# Patient Record
Sex: Male | Born: 1940
Health system: Southern US, Community
[De-identification: ages and names within clinical notes are randomized; demographics above are authoritative.]

## PROBLEM LIST (undated history)

## (undated) DIAGNOSIS — T7840XA Allergy, unspecified, initial encounter: Secondary | ICD-10-CM

## (undated) DIAGNOSIS — A071 Giardiasis [lambliasis]: Secondary | ICD-10-CM

## (undated) DIAGNOSIS — J45909 Unspecified asthma, uncomplicated: Secondary | ICD-10-CM

## (undated) HISTORY — DX: Unspecified asthma, uncomplicated: J45.909

## (undated) HISTORY — PX: COLONOSCOPY: SHX174

## (undated) HISTORY — PX: HAND SURGERY: SHX662

## (undated) HISTORY — DX: Giardiasis (lambliasis): A07.1

## (undated) HISTORY — DX: Allergy, unspecified, initial encounter: T78.40XA

## (undated) HISTORY — PX: TONSILLECTOMY: SUR1361

---

## 1997-10-10 ENCOUNTER — Ambulatory Visit (HOSPITAL_COMMUNITY): Admission: RE | Admit: 1997-10-10 | Discharge: 1997-10-10 | Payer: Self-pay | Admitting: Cardiology

## 2000-05-05 ENCOUNTER — Other Ambulatory Visit: Admission: RE | Admit: 2000-05-05 | Discharge: 2000-05-05 | Payer: Self-pay | Admitting: Otolaryngology

## 2003-11-25 ENCOUNTER — Ambulatory Visit (HOSPITAL_COMMUNITY): Admission: RE | Admit: 2003-11-25 | Discharge: 2003-11-25 | Payer: Self-pay | Admitting: Otolaryngology

## 2004-01-31 ENCOUNTER — Ambulatory Visit (HOSPITAL_COMMUNITY): Admission: RE | Admit: 2004-01-31 | Discharge: 2004-01-31 | Payer: Self-pay | Admitting: Otolaryngology

## 2004-01-31 ENCOUNTER — Encounter (INDEPENDENT_AMBULATORY_CARE_PROVIDER_SITE_OTHER): Payer: Self-pay | Admitting: *Deleted

## 2004-01-31 ENCOUNTER — Ambulatory Visit (HOSPITAL_BASED_OUTPATIENT_CLINIC_OR_DEPARTMENT_OTHER): Admission: RE | Admit: 2004-01-31 | Discharge: 2004-01-31 | Payer: Self-pay | Admitting: Otolaryngology

## 2004-04-20 HISTORY — PX: NASAL SINUS SURGERY: SHX719

## 2004-04-29 ENCOUNTER — Ambulatory Visit: Payer: Self-pay | Admitting: Internal Medicine

## 2004-05-06 ENCOUNTER — Ambulatory Visit: Payer: Self-pay | Admitting: Internal Medicine

## 2004-05-13 ENCOUNTER — Ambulatory Visit: Payer: Self-pay | Admitting: Internal Medicine

## 2004-05-20 ENCOUNTER — Ambulatory Visit: Payer: Self-pay | Admitting: Internal Medicine

## 2004-05-27 ENCOUNTER — Ambulatory Visit: Payer: Self-pay | Admitting: Internal Medicine

## 2004-06-03 ENCOUNTER — Ambulatory Visit: Payer: Self-pay | Admitting: Internal Medicine

## 2004-06-11 ENCOUNTER — Ambulatory Visit: Payer: Self-pay | Admitting: Internal Medicine

## 2004-06-18 ENCOUNTER — Ambulatory Visit: Payer: Self-pay | Admitting: Internal Medicine

## 2004-07-01 ENCOUNTER — Ambulatory Visit: Payer: Self-pay | Admitting: Internal Medicine

## 2004-07-08 ENCOUNTER — Ambulatory Visit: Payer: Self-pay | Admitting: Internal Medicine

## 2004-07-15 ENCOUNTER — Ambulatory Visit: Payer: Self-pay | Admitting: Internal Medicine

## 2004-07-22 ENCOUNTER — Ambulatory Visit: Payer: Self-pay | Admitting: Internal Medicine

## 2004-08-05 ENCOUNTER — Ambulatory Visit: Payer: Self-pay | Admitting: Internal Medicine

## 2004-08-12 ENCOUNTER — Ambulatory Visit: Payer: Self-pay | Admitting: Internal Medicine

## 2004-08-19 ENCOUNTER — Ambulatory Visit: Payer: Self-pay | Admitting: Internal Medicine

## 2004-08-26 ENCOUNTER — Ambulatory Visit: Payer: Self-pay | Admitting: Internal Medicine

## 2004-09-02 ENCOUNTER — Ambulatory Visit: Payer: Self-pay | Admitting: Gastroenterology

## 2004-09-02 ENCOUNTER — Ambulatory Visit: Payer: Self-pay | Admitting: Internal Medicine

## 2004-09-02 ENCOUNTER — Encounter (INDEPENDENT_AMBULATORY_CARE_PROVIDER_SITE_OTHER): Payer: Self-pay | Admitting: *Deleted

## 2004-09-10 ENCOUNTER — Ambulatory Visit: Payer: Self-pay | Admitting: Internal Medicine

## 2004-09-16 ENCOUNTER — Ambulatory Visit: Payer: Self-pay | Admitting: Internal Medicine

## 2004-09-30 ENCOUNTER — Ambulatory Visit: Payer: Self-pay | Admitting: Internal Medicine

## 2004-10-07 ENCOUNTER — Ambulatory Visit: Payer: Self-pay | Admitting: Internal Medicine

## 2004-10-16 ENCOUNTER — Ambulatory Visit: Payer: Self-pay | Admitting: Internal Medicine

## 2004-10-30 ENCOUNTER — Ambulatory Visit: Payer: Self-pay | Admitting: Internal Medicine

## 2004-11-04 ENCOUNTER — Ambulatory Visit: Payer: Self-pay | Admitting: Internal Medicine

## 2004-11-18 ENCOUNTER — Ambulatory Visit: Payer: Self-pay | Admitting: Internal Medicine

## 2004-11-25 ENCOUNTER — Ambulatory Visit: Payer: Self-pay | Admitting: Internal Medicine

## 2004-12-09 ENCOUNTER — Ambulatory Visit: Payer: Self-pay | Admitting: Internal Medicine

## 2004-12-23 ENCOUNTER — Ambulatory Visit: Payer: Self-pay | Admitting: Internal Medicine

## 2004-12-30 ENCOUNTER — Ambulatory Visit: Payer: Self-pay | Admitting: Internal Medicine

## 2005-01-06 ENCOUNTER — Ambulatory Visit: Payer: Self-pay | Admitting: Internal Medicine

## 2005-01-13 ENCOUNTER — Ambulatory Visit: Payer: Self-pay | Admitting: Internal Medicine

## 2005-01-20 ENCOUNTER — Ambulatory Visit: Payer: Self-pay | Admitting: Internal Medicine

## 2005-01-26 ENCOUNTER — Ambulatory Visit: Payer: Self-pay | Admitting: Internal Medicine

## 2005-02-03 ENCOUNTER — Ambulatory Visit: Payer: Self-pay | Admitting: Internal Medicine

## 2005-02-04 ENCOUNTER — Ambulatory Visit: Payer: Self-pay | Admitting: Internal Medicine

## 2005-02-17 ENCOUNTER — Ambulatory Visit: Payer: Self-pay | Admitting: Internal Medicine

## 2005-02-24 ENCOUNTER — Ambulatory Visit: Payer: Self-pay | Admitting: Internal Medicine

## 2005-03-03 ENCOUNTER — Ambulatory Visit: Payer: Self-pay | Admitting: Internal Medicine

## 2005-03-10 ENCOUNTER — Ambulatory Visit: Payer: Self-pay | Admitting: Internal Medicine

## 2005-03-17 ENCOUNTER — Ambulatory Visit: Payer: Self-pay | Admitting: Internal Medicine

## 2005-03-24 ENCOUNTER — Ambulatory Visit: Payer: Self-pay | Admitting: Internal Medicine

## 2005-03-26 ENCOUNTER — Ambulatory Visit: Payer: Self-pay | Admitting: Internal Medicine

## 2005-03-31 ENCOUNTER — Ambulatory Visit: Payer: Self-pay | Admitting: Internal Medicine

## 2005-04-06 ENCOUNTER — Ambulatory Visit: Payer: Self-pay | Admitting: Internal Medicine

## 2005-04-07 ENCOUNTER — Ambulatory Visit: Payer: Self-pay | Admitting: Internal Medicine

## 2005-04-28 ENCOUNTER — Ambulatory Visit: Payer: Self-pay | Admitting: Internal Medicine

## 2005-05-05 ENCOUNTER — Ambulatory Visit: Payer: Self-pay | Admitting: Internal Medicine

## 2005-05-13 ENCOUNTER — Ambulatory Visit: Payer: Self-pay | Admitting: Internal Medicine

## 2005-05-21 ENCOUNTER — Ambulatory Visit: Payer: Self-pay | Admitting: Internal Medicine

## 2005-05-26 ENCOUNTER — Ambulatory Visit: Payer: Self-pay | Admitting: Internal Medicine

## 2005-06-08 ENCOUNTER — Ambulatory Visit: Payer: Self-pay | Admitting: Internal Medicine

## 2005-06-16 ENCOUNTER — Ambulatory Visit: Payer: Self-pay | Admitting: Internal Medicine

## 2005-06-23 ENCOUNTER — Ambulatory Visit: Payer: Self-pay | Admitting: Internal Medicine

## 2005-06-29 ENCOUNTER — Ambulatory Visit: Payer: Self-pay | Admitting: Internal Medicine

## 2005-06-30 ENCOUNTER — Ambulatory Visit: Payer: Self-pay | Admitting: Internal Medicine

## 2005-07-07 ENCOUNTER — Ambulatory Visit: Payer: Self-pay | Admitting: Internal Medicine

## 2005-07-14 ENCOUNTER — Ambulatory Visit: Payer: Self-pay | Admitting: Internal Medicine

## 2005-07-28 ENCOUNTER — Ambulatory Visit: Payer: Self-pay | Admitting: Internal Medicine

## 2005-08-04 ENCOUNTER — Ambulatory Visit: Payer: Self-pay | Admitting: Internal Medicine

## 2005-08-13 ENCOUNTER — Ambulatory Visit: Payer: Self-pay | Admitting: Internal Medicine

## 2005-08-17 ENCOUNTER — Ambulatory Visit: Payer: Self-pay | Admitting: Internal Medicine

## 2005-08-25 ENCOUNTER — Ambulatory Visit: Payer: Self-pay | Admitting: Internal Medicine

## 2005-09-01 ENCOUNTER — Ambulatory Visit: Payer: Self-pay | Admitting: Internal Medicine

## 2005-09-08 ENCOUNTER — Ambulatory Visit: Payer: Self-pay | Admitting: Internal Medicine

## 2005-09-16 ENCOUNTER — Ambulatory Visit: Payer: Self-pay | Admitting: Internal Medicine

## 2005-09-29 ENCOUNTER — Ambulatory Visit: Payer: Self-pay | Admitting: Internal Medicine

## 2005-10-06 ENCOUNTER — Ambulatory Visit: Payer: Self-pay | Admitting: Internal Medicine

## 2005-10-13 ENCOUNTER — Ambulatory Visit: Payer: Self-pay | Admitting: Internal Medicine

## 2005-10-20 ENCOUNTER — Ambulatory Visit: Payer: Self-pay | Admitting: Internal Medicine

## 2005-10-27 ENCOUNTER — Ambulatory Visit: Payer: Self-pay | Admitting: Internal Medicine

## 2005-11-02 ENCOUNTER — Ambulatory Visit: Payer: Self-pay | Admitting: Internal Medicine

## 2005-11-17 ENCOUNTER — Ambulatory Visit: Payer: Self-pay | Admitting: Internal Medicine

## 2005-12-08 ENCOUNTER — Ambulatory Visit: Payer: Self-pay | Admitting: Internal Medicine

## 2005-12-29 ENCOUNTER — Ambulatory Visit: Payer: Self-pay | Admitting: Internal Medicine

## 2006-01-05 ENCOUNTER — Ambulatory Visit: Payer: Self-pay | Admitting: Internal Medicine

## 2006-01-12 ENCOUNTER — Ambulatory Visit: Payer: Self-pay | Admitting: Internal Medicine

## 2006-01-19 ENCOUNTER — Ambulatory Visit: Payer: Self-pay | Admitting: Internal Medicine

## 2006-02-02 ENCOUNTER — Ambulatory Visit: Payer: Self-pay | Admitting: Internal Medicine

## 2006-02-09 ENCOUNTER — Ambulatory Visit: Payer: Self-pay | Admitting: Internal Medicine

## 2006-02-16 ENCOUNTER — Ambulatory Visit: Payer: Self-pay | Admitting: Internal Medicine

## 2006-02-22 ENCOUNTER — Ambulatory Visit: Payer: Self-pay | Admitting: Internal Medicine

## 2006-03-02 ENCOUNTER — Ambulatory Visit: Payer: Self-pay | Admitting: Internal Medicine

## 2006-03-08 ENCOUNTER — Ambulatory Visit: Payer: Self-pay | Admitting: Internal Medicine

## 2006-03-15 ENCOUNTER — Ambulatory Visit: Payer: Self-pay | Admitting: Internal Medicine

## 2006-03-30 ENCOUNTER — Ambulatory Visit: Payer: Self-pay | Admitting: Internal Medicine

## 2006-04-07 ENCOUNTER — Ambulatory Visit: Payer: Self-pay | Admitting: Internal Medicine

## 2006-04-12 ENCOUNTER — Ambulatory Visit: Payer: Self-pay | Admitting: Internal Medicine

## 2006-04-27 ENCOUNTER — Ambulatory Visit: Payer: Self-pay | Admitting: Internal Medicine

## 2006-05-04 ENCOUNTER — Ambulatory Visit: Payer: Self-pay | Admitting: Internal Medicine

## 2006-05-11 ENCOUNTER — Ambulatory Visit: Payer: Self-pay | Admitting: Internal Medicine

## 2006-05-18 ENCOUNTER — Ambulatory Visit: Payer: Self-pay | Admitting: Internal Medicine

## 2006-05-25 ENCOUNTER — Ambulatory Visit: Payer: Self-pay | Admitting: Internal Medicine

## 2006-06-01 ENCOUNTER — Ambulatory Visit: Payer: Self-pay | Admitting: Internal Medicine

## 2006-06-08 ENCOUNTER — Ambulatory Visit: Payer: Self-pay | Admitting: Internal Medicine

## 2006-06-15 ENCOUNTER — Ambulatory Visit: Payer: Self-pay | Admitting: Internal Medicine

## 2006-06-22 ENCOUNTER — Ambulatory Visit: Payer: Self-pay | Admitting: Internal Medicine

## 2006-06-29 ENCOUNTER — Ambulatory Visit: Payer: Self-pay | Admitting: Internal Medicine

## 2006-07-06 ENCOUNTER — Ambulatory Visit: Payer: Self-pay | Admitting: Internal Medicine

## 2006-07-20 ENCOUNTER — Ambulatory Visit: Payer: Self-pay | Admitting: Internal Medicine

## 2006-07-27 ENCOUNTER — Ambulatory Visit: Payer: Self-pay | Admitting: Internal Medicine

## 2006-08-03 ENCOUNTER — Ambulatory Visit: Payer: Self-pay | Admitting: Internal Medicine

## 2006-08-10 ENCOUNTER — Ambulatory Visit: Payer: Self-pay | Admitting: Internal Medicine

## 2006-08-17 ENCOUNTER — Ambulatory Visit: Payer: Self-pay | Admitting: Internal Medicine

## 2006-08-23 ENCOUNTER — Ambulatory Visit: Payer: Self-pay | Admitting: Internal Medicine

## 2006-08-24 ENCOUNTER — Ambulatory Visit: Payer: Self-pay | Admitting: Internal Medicine

## 2006-08-31 ENCOUNTER — Ambulatory Visit: Payer: Self-pay | Admitting: Internal Medicine

## 2006-09-07 ENCOUNTER — Ambulatory Visit: Payer: Self-pay | Admitting: Internal Medicine

## 2006-09-14 ENCOUNTER — Ambulatory Visit: Payer: Self-pay | Admitting: Internal Medicine

## 2006-09-28 ENCOUNTER — Ambulatory Visit: Payer: Self-pay | Admitting: Internal Medicine

## 2006-10-05 ENCOUNTER — Ambulatory Visit: Payer: Self-pay | Admitting: Internal Medicine

## 2006-10-11 ENCOUNTER — Ambulatory Visit: Payer: Self-pay | Admitting: Internal Medicine

## 2006-10-21 ENCOUNTER — Ambulatory Visit: Payer: Self-pay | Admitting: Internal Medicine

## 2006-11-02 ENCOUNTER — Ambulatory Visit: Payer: Self-pay | Admitting: Internal Medicine

## 2006-11-08 ENCOUNTER — Ambulatory Visit: Payer: Self-pay | Admitting: Internal Medicine

## 2006-11-23 ENCOUNTER — Ambulatory Visit: Payer: Self-pay | Admitting: Internal Medicine

## 2006-12-07 ENCOUNTER — Ambulatory Visit: Payer: Self-pay | Admitting: Internal Medicine

## 2006-12-21 ENCOUNTER — Ambulatory Visit: Payer: Self-pay | Admitting: Internal Medicine

## 2006-12-28 ENCOUNTER — Ambulatory Visit: Payer: Self-pay | Admitting: Internal Medicine

## 2007-01-04 ENCOUNTER — Ambulatory Visit: Payer: Self-pay | Admitting: Internal Medicine

## 2007-01-18 ENCOUNTER — Ambulatory Visit: Payer: Self-pay | Admitting: Internal Medicine

## 2007-01-25 ENCOUNTER — Ambulatory Visit: Payer: Self-pay | Admitting: Internal Medicine

## 2007-01-26 ENCOUNTER — Ambulatory Visit: Payer: Self-pay | Admitting: Internal Medicine

## 2007-02-18 ENCOUNTER — Ambulatory Visit: Payer: Self-pay | Admitting: Internal Medicine

## 2007-02-22 ENCOUNTER — Ambulatory Visit: Payer: Self-pay | Admitting: Internal Medicine

## 2007-03-01 ENCOUNTER — Ambulatory Visit: Payer: Self-pay | Admitting: Internal Medicine

## 2007-03-08 ENCOUNTER — Ambulatory Visit: Payer: Self-pay | Admitting: Internal Medicine

## 2007-03-15 ENCOUNTER — Ambulatory Visit: Payer: Self-pay | Admitting: Internal Medicine

## 2007-03-22 ENCOUNTER — Ambulatory Visit: Payer: Self-pay | Admitting: Internal Medicine

## 2007-03-29 ENCOUNTER — Ambulatory Visit: Payer: Self-pay | Admitting: Internal Medicine

## 2007-03-29 DIAGNOSIS — J309 Allergic rhinitis, unspecified: Secondary | ICD-10-CM | POA: Insufficient documentation

## 2007-03-29 DIAGNOSIS — J45901 Unspecified asthma with (acute) exacerbation: Secondary | ICD-10-CM | POA: Insufficient documentation

## 2007-04-05 ENCOUNTER — Ambulatory Visit: Payer: Self-pay | Admitting: Internal Medicine

## 2007-04-12 ENCOUNTER — Ambulatory Visit: Payer: Self-pay | Admitting: Internal Medicine

## 2007-04-25 ENCOUNTER — Ambulatory Visit: Payer: Self-pay | Admitting: Internal Medicine

## 2007-04-26 ENCOUNTER — Ambulatory Visit: Payer: Self-pay | Admitting: Internal Medicine

## 2007-05-11 ENCOUNTER — Ambulatory Visit: Payer: Self-pay | Admitting: Internal Medicine

## 2007-05-17 ENCOUNTER — Ambulatory Visit: Payer: Self-pay | Admitting: Internal Medicine

## 2007-05-24 ENCOUNTER — Ambulatory Visit: Payer: Self-pay | Admitting: Internal Medicine

## 2007-06-01 ENCOUNTER — Ambulatory Visit: Payer: Self-pay | Admitting: Internal Medicine

## 2007-06-07 ENCOUNTER — Ambulatory Visit: Payer: Self-pay | Admitting: Internal Medicine

## 2007-06-14 ENCOUNTER — Ambulatory Visit: Payer: Self-pay | Admitting: Internal Medicine

## 2007-06-22 ENCOUNTER — Ambulatory Visit: Payer: Self-pay | Admitting: Internal Medicine

## 2007-07-05 ENCOUNTER — Ambulatory Visit: Payer: Self-pay | Admitting: Internal Medicine

## 2007-07-12 ENCOUNTER — Ambulatory Visit: Payer: Self-pay | Admitting: Internal Medicine

## 2007-07-19 ENCOUNTER — Ambulatory Visit: Payer: Self-pay | Admitting: Internal Medicine

## 2007-07-26 ENCOUNTER — Ambulatory Visit: Payer: Self-pay | Admitting: Internal Medicine

## 2007-08-08 ENCOUNTER — Ambulatory Visit: Payer: Self-pay | Admitting: Internal Medicine

## 2007-08-16 ENCOUNTER — Ambulatory Visit: Payer: Self-pay | Admitting: Internal Medicine

## 2007-08-23 ENCOUNTER — Ambulatory Visit: Payer: Self-pay | Admitting: Internal Medicine

## 2007-08-30 ENCOUNTER — Ambulatory Visit: Payer: Self-pay | Admitting: Internal Medicine

## 2007-09-06 ENCOUNTER — Ambulatory Visit: Payer: Self-pay | Admitting: Internal Medicine

## 2007-09-14 ENCOUNTER — Ambulatory Visit: Payer: Self-pay | Admitting: Internal Medicine

## 2007-09-27 ENCOUNTER — Ambulatory Visit: Payer: Self-pay | Admitting: Internal Medicine

## 2007-10-04 ENCOUNTER — Ambulatory Visit: Payer: Self-pay | Admitting: Internal Medicine

## 2007-10-07 ENCOUNTER — Ambulatory Visit: Payer: Self-pay | Admitting: Internal Medicine

## 2007-10-13 ENCOUNTER — Ambulatory Visit: Payer: Self-pay | Admitting: Internal Medicine

## 2007-10-18 ENCOUNTER — Ambulatory Visit: Payer: Self-pay | Admitting: Internal Medicine

## 2007-11-01 ENCOUNTER — Ambulatory Visit: Payer: Self-pay | Admitting: Internal Medicine

## 2007-11-08 ENCOUNTER — Ambulatory Visit: Payer: Self-pay | Admitting: Internal Medicine

## 2007-12-06 ENCOUNTER — Ambulatory Visit: Payer: Self-pay | Admitting: Internal Medicine

## 2007-12-13 ENCOUNTER — Ambulatory Visit: Payer: Self-pay | Admitting: Internal Medicine

## 2007-12-27 ENCOUNTER — Ambulatory Visit: Payer: Self-pay | Admitting: Internal Medicine

## 2008-01-02 ENCOUNTER — Ambulatory Visit: Payer: Self-pay | Admitting: Internal Medicine

## 2008-01-11 ENCOUNTER — Ambulatory Visit: Payer: Self-pay | Admitting: Internal Medicine

## 2008-01-11 ENCOUNTER — Ambulatory Visit: Payer: Self-pay | Admitting: Pulmonary Disease

## 2008-01-16 ENCOUNTER — Ambulatory Visit: Payer: Self-pay | Admitting: Internal Medicine

## 2008-01-31 ENCOUNTER — Ambulatory Visit: Payer: Self-pay | Admitting: Internal Medicine

## 2008-02-07 ENCOUNTER — Ambulatory Visit: Payer: Self-pay | Admitting: Internal Medicine

## 2008-02-14 ENCOUNTER — Ambulatory Visit: Payer: Self-pay | Admitting: Internal Medicine

## 2008-02-20 ENCOUNTER — Ambulatory Visit: Payer: Self-pay | Admitting: Internal Medicine

## 2008-02-28 ENCOUNTER — Ambulatory Visit: Payer: Self-pay | Admitting: Internal Medicine

## 2008-03-06 ENCOUNTER — Ambulatory Visit: Payer: Self-pay | Admitting: Internal Medicine

## 2008-03-13 ENCOUNTER — Ambulatory Visit: Payer: Self-pay | Admitting: Internal Medicine

## 2008-03-14 ENCOUNTER — Ambulatory Visit: Payer: Self-pay | Admitting: Internal Medicine

## 2008-03-20 ENCOUNTER — Ambulatory Visit: Payer: Self-pay | Admitting: Internal Medicine

## 2008-03-26 ENCOUNTER — Ambulatory Visit: Payer: Self-pay | Admitting: Internal Medicine

## 2008-04-02 ENCOUNTER — Ambulatory Visit: Payer: Self-pay | Admitting: Internal Medicine

## 2008-04-26 ENCOUNTER — Ambulatory Visit: Payer: Self-pay | Admitting: Internal Medicine

## 2008-05-04 ENCOUNTER — Ambulatory Visit: Payer: Self-pay | Admitting: Internal Medicine

## 2008-05-08 ENCOUNTER — Ambulatory Visit: Payer: Self-pay | Admitting: Internal Medicine

## 2008-05-15 ENCOUNTER — Ambulatory Visit (HOSPITAL_COMMUNITY): Admission: RE | Admit: 2008-05-15 | Discharge: 2008-05-15 | Payer: Self-pay | Admitting: Otolaryngology

## 2008-05-15 ENCOUNTER — Ambulatory Visit: Payer: Self-pay | Admitting: Internal Medicine

## 2008-05-22 ENCOUNTER — Ambulatory Visit: Payer: Self-pay | Admitting: Internal Medicine

## 2008-05-29 ENCOUNTER — Ambulatory Visit: Payer: Self-pay | Admitting: Internal Medicine

## 2008-06-05 ENCOUNTER — Ambulatory Visit: Payer: Self-pay | Admitting: Internal Medicine

## 2008-06-13 ENCOUNTER — Ambulatory Visit: Payer: Self-pay | Admitting: Internal Medicine

## 2008-06-26 ENCOUNTER — Ambulatory Visit: Payer: Self-pay | Admitting: Internal Medicine

## 2008-07-03 ENCOUNTER — Ambulatory Visit: Payer: Self-pay | Admitting: Internal Medicine

## 2008-07-10 ENCOUNTER — Ambulatory Visit: Payer: Self-pay | Admitting: Internal Medicine

## 2008-07-24 ENCOUNTER — Ambulatory Visit: Payer: Self-pay | Admitting: Internal Medicine

## 2008-07-31 ENCOUNTER — Ambulatory Visit: Payer: Self-pay | Admitting: Internal Medicine

## 2008-08-08 ENCOUNTER — Ambulatory Visit: Payer: Self-pay | Admitting: Internal Medicine

## 2008-08-13 ENCOUNTER — Ambulatory Visit: Payer: Self-pay | Admitting: Internal Medicine

## 2008-08-14 ENCOUNTER — Ambulatory Visit: Payer: Self-pay | Admitting: Internal Medicine

## 2008-08-21 ENCOUNTER — Ambulatory Visit: Payer: Self-pay | Admitting: Internal Medicine

## 2008-08-28 ENCOUNTER — Ambulatory Visit: Payer: Self-pay | Admitting: Internal Medicine

## 2008-09-11 ENCOUNTER — Ambulatory Visit: Payer: Self-pay | Admitting: Internal Medicine

## 2008-10-01 ENCOUNTER — Ambulatory Visit: Payer: Self-pay | Admitting: Internal Medicine

## 2008-10-05 ENCOUNTER — Ambulatory Visit: Payer: Self-pay | Admitting: Internal Medicine

## 2008-10-12 ENCOUNTER — Ambulatory Visit: Payer: Self-pay | Admitting: Internal Medicine

## 2008-10-15 ENCOUNTER — Ambulatory Visit: Payer: Self-pay | Admitting: Internal Medicine

## 2008-11-01 ENCOUNTER — Ambulatory Visit: Payer: Self-pay | Admitting: Internal Medicine

## 2008-11-06 ENCOUNTER — Ambulatory Visit: Payer: Self-pay | Admitting: Internal Medicine

## 2008-11-20 ENCOUNTER — Ambulatory Visit: Payer: Self-pay | Admitting: Internal Medicine

## 2008-11-27 ENCOUNTER — Ambulatory Visit: Payer: Self-pay | Admitting: Internal Medicine

## 2008-12-12 ENCOUNTER — Ambulatory Visit: Payer: Self-pay | Admitting: Internal Medicine

## 2008-12-18 ENCOUNTER — Ambulatory Visit: Payer: Self-pay | Admitting: Internal Medicine

## 2009-01-02 ENCOUNTER — Ambulatory Visit: Payer: Self-pay | Admitting: Internal Medicine

## 2009-01-08 ENCOUNTER — Ambulatory Visit: Payer: Self-pay | Admitting: Internal Medicine

## 2009-01-15 ENCOUNTER — Ambulatory Visit: Payer: Self-pay | Admitting: Internal Medicine

## 2009-02-06 ENCOUNTER — Ambulatory Visit: Payer: Self-pay | Admitting: Internal Medicine

## 2009-02-12 ENCOUNTER — Ambulatory Visit: Payer: Self-pay | Admitting: Internal Medicine

## 2009-02-19 ENCOUNTER — Ambulatory Visit: Payer: Self-pay | Admitting: Internal Medicine

## 2009-02-27 ENCOUNTER — Ambulatory Visit: Payer: Self-pay | Admitting: Internal Medicine

## 2009-03-04 ENCOUNTER — Ambulatory Visit: Payer: Self-pay | Admitting: Internal Medicine

## 2009-03-05 ENCOUNTER — Encounter: Admission: RE | Admit: 2009-03-05 | Discharge: 2009-03-05 | Payer: Self-pay | Admitting: Cardiology

## 2009-03-12 ENCOUNTER — Ambulatory Visit: Payer: Self-pay | Admitting: Internal Medicine

## 2009-03-19 ENCOUNTER — Ambulatory Visit: Payer: Self-pay | Admitting: Internal Medicine

## 2009-03-19 HISTORY — PX: NM MYOVIEW LTD: HXRAD82

## 2009-03-25 ENCOUNTER — Ambulatory Visit: Payer: Self-pay | Admitting: Internal Medicine

## 2009-04-01 ENCOUNTER — Ambulatory Visit: Payer: Self-pay | Admitting: Internal Medicine

## 2009-04-26 ENCOUNTER — Ambulatory Visit: Payer: Self-pay | Admitting: Internal Medicine

## 2009-04-29 ENCOUNTER — Ambulatory Visit: Payer: Self-pay | Admitting: Internal Medicine

## 2009-05-07 ENCOUNTER — Ambulatory Visit: Payer: Self-pay | Admitting: Internal Medicine

## 2009-05-15 ENCOUNTER — Ambulatory Visit: Payer: Self-pay | Admitting: Internal Medicine

## 2009-05-21 ENCOUNTER — Ambulatory Visit: Payer: Self-pay | Admitting: Internal Medicine

## 2009-05-28 ENCOUNTER — Ambulatory Visit: Payer: Self-pay | Admitting: Internal Medicine

## 2009-05-29 ENCOUNTER — Ambulatory Visit: Payer: Self-pay | Admitting: Internal Medicine

## 2009-06-04 ENCOUNTER — Ambulatory Visit: Payer: Self-pay | Admitting: Internal Medicine

## 2009-06-17 ENCOUNTER — Ambulatory Visit: Payer: Self-pay | Admitting: Internal Medicine

## 2009-06-25 ENCOUNTER — Ambulatory Visit: Payer: Self-pay | Admitting: Internal Medicine

## 2009-07-10 ENCOUNTER — Ambulatory Visit: Payer: Self-pay | Admitting: Internal Medicine

## 2009-07-16 ENCOUNTER — Ambulatory Visit: Payer: Self-pay | Admitting: Internal Medicine

## 2009-07-22 ENCOUNTER — Encounter: Admission: RE | Admit: 2009-07-22 | Discharge: 2009-07-22 | Payer: Self-pay | Admitting: Cardiology

## 2009-07-22 HISTORY — PX: OTHER SURGICAL HISTORY: SHX169

## 2009-07-23 ENCOUNTER — Ambulatory Visit: Payer: Self-pay | Admitting: Internal Medicine

## 2009-07-30 ENCOUNTER — Ambulatory Visit: Payer: Self-pay | Admitting: Internal Medicine

## 2009-08-06 ENCOUNTER — Ambulatory Visit: Payer: Self-pay | Admitting: Internal Medicine

## 2009-08-13 ENCOUNTER — Ambulatory Visit: Payer: Self-pay | Admitting: Internal Medicine

## 2009-08-20 ENCOUNTER — Ambulatory Visit: Payer: Self-pay | Admitting: Internal Medicine

## 2009-08-26 ENCOUNTER — Ambulatory Visit: Payer: Self-pay | Admitting: Internal Medicine

## 2009-09-03 ENCOUNTER — Ambulatory Visit: Payer: Self-pay | Admitting: Internal Medicine

## 2009-09-10 ENCOUNTER — Ambulatory Visit: Payer: Self-pay | Admitting: Internal Medicine

## 2009-09-23 ENCOUNTER — Ambulatory Visit: Payer: Self-pay | Admitting: Internal Medicine

## 2009-10-08 ENCOUNTER — Ambulatory Visit: Payer: Self-pay | Admitting: Internal Medicine

## 2009-10-15 ENCOUNTER — Ambulatory Visit: Payer: Self-pay | Admitting: Internal Medicine

## 2009-10-22 ENCOUNTER — Ambulatory Visit: Payer: Self-pay | Admitting: Internal Medicine

## 2009-10-23 ENCOUNTER — Ambulatory Visit: Payer: Self-pay | Admitting: Internal Medicine

## 2009-10-29 ENCOUNTER — Ambulatory Visit: Payer: Self-pay | Admitting: Internal Medicine

## 2009-11-04 ENCOUNTER — Ambulatory Visit: Payer: Self-pay | Admitting: Internal Medicine

## 2009-11-27 ENCOUNTER — Ambulatory Visit: Payer: Self-pay | Admitting: Internal Medicine

## 2009-11-29 ENCOUNTER — Ambulatory Visit (HOSPITAL_COMMUNITY): Admission: RE | Admit: 2009-11-29 | Discharge: 2009-11-29 | Payer: Self-pay | Admitting: Otolaryngology

## 2009-12-03 ENCOUNTER — Ambulatory Visit: Payer: Self-pay | Admitting: Internal Medicine

## 2009-12-24 ENCOUNTER — Ambulatory Visit: Payer: Self-pay | Admitting: Internal Medicine

## 2009-12-31 ENCOUNTER — Ambulatory Visit: Payer: Self-pay | Admitting: Internal Medicine

## 2010-01-14 ENCOUNTER — Ambulatory Visit: Payer: Self-pay | Admitting: Internal Medicine

## 2010-01-22 ENCOUNTER — Ambulatory Visit: Payer: Self-pay | Admitting: Internal Medicine

## 2010-01-22 DIAGNOSIS — Z23 Encounter for immunization: Secondary | ICD-10-CM

## 2010-02-05 ENCOUNTER — Ambulatory Visit: Payer: Self-pay | Admitting: Internal Medicine

## 2010-02-11 ENCOUNTER — Ambulatory Visit: Payer: Self-pay | Admitting: Internal Medicine

## 2010-02-18 ENCOUNTER — Ambulatory Visit: Payer: Self-pay | Admitting: Internal Medicine

## 2010-02-26 ENCOUNTER — Ambulatory Visit: Payer: Self-pay | Admitting: Internal Medicine

## 2010-03-03 ENCOUNTER — Ambulatory Visit: Payer: Self-pay | Admitting: Internal Medicine

## 2010-03-04 ENCOUNTER — Ambulatory Visit: Payer: Self-pay | Admitting: Internal Medicine

## 2010-03-05 DIAGNOSIS — E785 Hyperlipidemia, unspecified: Secondary | ICD-10-CM | POA: Insufficient documentation

## 2010-03-05 DIAGNOSIS — Z91018 Allergy to other foods: Secondary | ICD-10-CM | POA: Insufficient documentation

## 2010-03-06 ENCOUNTER — Telehealth (INDEPENDENT_AMBULATORY_CARE_PROVIDER_SITE_OTHER): Payer: Self-pay | Admitting: *Deleted

## 2010-03-11 ENCOUNTER — Ambulatory Visit: Payer: Self-pay | Admitting: Internal Medicine

## 2010-03-18 ENCOUNTER — Ambulatory Visit: Payer: Self-pay | Admitting: Internal Medicine

## 2010-03-25 ENCOUNTER — Ambulatory Visit: Payer: Self-pay | Admitting: Internal Medicine

## 2010-04-01 ENCOUNTER — Ambulatory Visit: Payer: Self-pay | Admitting: Internal Medicine

## 2010-04-15 ENCOUNTER — Ambulatory Visit: Payer: Self-pay | Admitting: Internal Medicine

## 2010-05-03 ENCOUNTER — Ambulatory Visit: Payer: Self-pay | Admitting: Internal Medicine

## 2010-05-08 ENCOUNTER — Ambulatory Visit: Payer: Self-pay | Admitting: Internal Medicine

## 2010-05-13 ENCOUNTER — Ambulatory Visit: Payer: Self-pay | Admitting: Internal Medicine

## 2010-05-14 ENCOUNTER — Ambulatory Visit: Payer: Self-pay | Admitting: Internal Medicine

## 2010-05-19 ENCOUNTER — Telehealth (INDEPENDENT_AMBULATORY_CARE_PROVIDER_SITE_OTHER): Payer: Self-pay | Admitting: *Deleted

## 2010-05-20 NOTE — Miscellaneous (Signed)
Summary: Intradermals/[facility]  Intradermals/[facility]   Imported By: Lester [place] 03/11/2010 11:26:24  _____________________________________________________________________  External Attachment:    Type:   Image     Comment:   External Document

## 2010-05-20 NOTE — Assessment & Plan Note (Signed)
Summary: FLU SHOT/[facility]  Nurse Visit   Orders Added: 1)  Admin 1st Vaccine [90471] 2)  Flu Vaccine 72yrs + [40347]  Flu Vaccine Consent Questions     Do you have a history of severe allergic reactions to this vaccine? no    Any prior history of allergic reactions to egg and/or gelatin? no    Do you have a sensitivity to the preservative Thimersol? no    Do you have a past history of Guillan-Barre Syndrome? no    Do you currently have an acute febrile illness? no    Have you ever had a severe reaction to latex? no    Vaccine information given and explained to patient? yes    Are you currently pregnant? no    Lot Number:[id number]   Exp Date:10/17/2009   Site Given  Right Deltoid IM Tammy Scott  February 27, 2009 11:13 AM

## 2010-05-20 NOTE — Progress Notes (Signed)
Summary: Allergy testing results  ---- Converted from flag ---- ---- 03/05/2010 3:46 PM, Harry Budge MD wrote: Please let Dr Harry [name] know. His skin test pattern was a little less intense, but the overall pattern was very similar to his last previous testing and his allergy vaccine from 2002  I don't recommend that we change his allergy vaccine at this time, not enough difference to justify starting over. Marland Kitchen ------------------------------       Additional Follow-up for Phone Call Additional follow up Details #2::    I called Dr. Haroldine [name]' officee-he is in a meeting at this time and Harry [name] gave me his cell number  [phone] to call later today.Harry [name] CMA  March 06, 2010 9:22 AM    Spoke with Dr. Gwynne [name] of [facility]'s recs.Harry [name] CMA  March 06, 2010 10:47 AM

## 2010-05-20 NOTE — Miscellaneous (Signed)
Summary: FLU SHOT   Flu Vaccine Consent Questions     Do you have a history of severe allergic reactions to this vaccine? no    Any prior history of allergic reactions to egg and/or gelatin? no    Do you have a sensitivity to the preservative Thimersol? no    Do you have a past history of Guillan-Barre Syndrome? no    Do you currently have an acute febrile illness? no    Have you ever had a severe reaction to latex? no    Vaccine information given and explained to patient? yes    Are you currently pregnant? no    Lot Number:[id number]   Site Given Right Deltoid IM   Given by Dimas Millin in allergy lab. Reynaldo Minium CMA  January 13, 2008 3:48 PM            Clinical Lists Changes  Orders: Added new Service order of Admin 1st Vaccine ([address]) - Signed Added new Service order of Flu Vaccine 2yrs + [phone]) - Signed Observations: Added new observation of FLU VAX VIS: 11/29/07 version (01/11/2008 15:48) Added new observation of FLU VAXLOT: [id number] (01/11/2008 15:48) Added new observation of FLU VAXMFR: Glaxosmithkline (01/11/2008 15:48) Added new observation of FLU VAX EXP: 10/17/2008 (01/11/2008 15:48) Added new observation of FLU VAX DSE: 0.23ml (01/11/2008 15:48) Added new observation of FLU VAX: Fluvax 3+ (01/11/2008 15:48)

## 2010-05-20 NOTE — Miscellaneous (Signed)
Summary: Injection Record/[facility]  Injection Record/ Allergy   Imported By: Sherian Rein 08/22/2009 13:59:15  _____________________________________________________________________  External Attachment:    Type:   Image     Comment:   External Document

## 2010-05-20 NOTE — Assessment & Plan Note (Signed)
Summary: allergy testing ok per katie/mg   Vital Signs:  Patient profile:   70 year old male Weight:      222.38 pounds O2 Sat:      97 % on Room air Pulse rate:   62 / minute BP sitting:   128 / 68  (left arm)  Vitals Entered By: Reynaldo Minium CMA (03-11-2010 9:07 AM)  O2 Flow:  Room air CC: Allergy Testing   Primary Provider/Referring Provider:  Cassell Clement, MD  CC:  Allergy Testing.  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 Now needing to establish in EMR.  Mild transient asthma as a [employer] adult. Coming now to update allergy skin test profile.  Records reviewed from paper chart.  Significant nasal and throat irritation with tree/ grass/ weed pollens, cats, dust mite. Several fruits ( uncooked peaches, pears, apples, plums, blackberries) caused throat irritation, avoided by eating cooked fruits. Latex seemed to irritate his wrist once on contact so he uses latex-free. Has been on allergy vaccine successfully since 1996. No recurrence of venom sensitivity- restung several times after venom desensitization remotely by Dr Stefan Church. Skin test-    Preventive Screening-Counseling & Management  Alcohol-Tobacco     Smoking Status: never  Current Medications (verified): 1)  Allegra Allergy 180 Mg Tabs (Fexofenadine Hcl) .... Take 1 By Mouth Once Daily As Needed 2)  Lipitor 10 Mg Tabs (Atorvastatin Calcium) .... Take 1 By Mouth Once Daily 3)  Aleve 220 Mg Tabs (Naproxen Sodium) .... Take As Directed As Needed 4)  Allergy Vaccine 1:10 Gh .... 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  The patient denies shortness of breath with activity, shortness of breath at rest, productive cough, non-productive cough, coughing up blood, chest pain, irregular heartbeats, acid heartburn, indigestion, loss of appetite, weight change, abdominal pain, difficulty swallowing, sore throat, tooth/dental problems, headaches, ear ache, anxiety, hand/feet swelling, rash, change in color of mucus, and fever.    Physical Exam  Additional Exam:  General: A/Ox3; pleasant and cooperative, NAD, fit- appearing SKIN: no rash, lesions NODES: no lymphadenopathy HEENT: [place]/AT, EOM- WNL, Conjuctivae- clear, PERRLA, TM-WN, osteoma right canal,, Nose- clear, Throat- clear and wnl,  white mucoid postnasal drip NECK: Supple w/ fair ROM, JVD- none, normal carotid impulses w/o bruits Thyroid- normal to palpation CHEST: Clear to P&A HEART: RRR, no m/g/r heard ABDOMEN: Soft and nl; nml bowel sounds; no organomegaly or masses noted ZOX:WRUE, nl pulses, no edema  NEURO: Grossly intact to observation      Impression & Recommendations:  Problem # 1:  ALLERGIC RHINITIS (ICD-477.9)  Chronic perennial allergic rhinitis. No significant asthma.  Skin tests continue to reflect clearly atopic symptoms. He has been carefull about exposure limitation.  On comparison with latest skin testing in 2002, current tests show modies reduction in intensity of reaction, but the  pattern is little different. I wll recommend that we continue his present allergy vaccine mix. There wouldn't be a clinically important change.  His updated medication list for this problem includes:    Allegra Allergy 180 Mg Tabs (Fexofenadine hcl) .Marland Kitchen... Take 1 by mouth once daily as needed  Orders: Est. Patient Level III ([address]) Allergy Puncture Test (60454) Allergy I.D Test (09811)  Problem # 2:  PERSONAL HISTORY OF ALLERGY TO OTHER FOODS (ICD-V15.05) As expected, cooking neutralizes the allergic response associated with most triggering foods, supporting probability that the response is indeed alllergic. He avoids triggering foods. Est. Patient Level III (91478) Allergy Puncture Test (29562) Allergy I.D Test (13086)  Medications Added to Medication List This Visit: 1)  Allegra Allergy 180 Mg Tabs (Fexofenadine hcl) .... Take 1 by mouth once daily as needed 2)  Lipitor 10 Mg Tabs (Atorvastatin calcium) .... Take 1 by mouth once daily 3)  Aleve 220 Mg Tabs (Naproxen sodium) .... Take as directed as needed 4)  Allergy Vaccine 1:10 Gh  .... Weekly  Patient Instructions: 1)  Please schedule a follow-up appointment in 1 year. 2)  I will review current skin test results and compare them with your current vaccine. I will let you know if I think we should make any changes. :         Results very similar to last testing in 2002. Not different enough to require change in vaccine mix at this time.l.  3)  Clarinex given 4)  cc Dr Patty Sermons   Orders Added: 1)  Est. Patient Level III [57846] 2)  Allergy Puncture Test [95004] 3)  Allergy I.D Test [96295]

## 2010-05-20 NOTE — Miscellaneous (Signed)
Summary: Injection Record / [facility]    Injection Record / [facility]    Imported By: Lennie Odor 12/20/2009 09:58:21  _____________________________________________________________________  External Attachment:    Type:   Image     Comment:   External Document

## 2010-05-20 NOTE — Miscellaneous (Signed)
Summary: Injection record/[facility]  Injection record/[facility]   Imported By: Sherian Rein 09/10/2009 13:30:31  _____________________________________________________________________  External Attachment:    Type:   Image     Comment:   External Document

## 2010-05-20 NOTE — Assessment & Plan Note (Signed)
Summary: FLU SHOT/[facility]  REFER TO 01-11-08 VISIT. Reynaldo Minium CMA  January 23, 2008 9:50 AM                              ]

## 2010-05-21 DIAGNOSIS — J301 Allergic rhinitis due to pollen: Secondary | ICD-10-CM

## 2010-05-22 NOTE — Miscellaneous (Addendum)
Summary: Injection record  Injection record   Imported By: Lester [place] 05/02/2010 11:46:36  _____________________________________________________________________  External Attachment:    Type:   Image     Comment:   External Document  Appended Document: Injection record NEED HIS GI PROCEDURES...  Appended Document: Injection record colonoscopy from Dr. Victorino Dike cut and pasted in EMR

## 2010-05-27 DIAGNOSIS — J301 Allergic rhinitis due to pollen: Secondary | ICD-10-CM

## 2010-05-28 ENCOUNTER — Telehealth (INDEPENDENT_AMBULATORY_CARE_PROVIDER_SITE_OTHER): Payer: Self-pay | Admitting: *Deleted

## 2010-05-28 NOTE — Progress Notes (Addendum)
Summary: schedule colon w/ propofol. will need better prep  Phone Note Call from Patient   Summary of Call: ENCOURAGE STRICT PREP ADHERENCE PER PRIOR POOR PREP....   Signed by Mardella Layman MD [facility] on 05/19/2010 at 3:23 PM  ________________________________________________________________________ Contacted pt per Dr. Norval Gable instructions to schedule colonoscopy in March 2012 w/ Propofol.  March schedule is not available.  Pt will call back in one week to schedule colonoscopy.   Signed by Ezra Sites RN on 05/19/2010 at 3:37 PM     Appended Document: schedule colon w/ propofol. will need better prep Patient called in and stated per Dr Jarold Motto, schedule him for COLON w/ Propofol for 06/27/10. He is out of town 2/29- 06/26/10. His Pre visit is 06/10/10 @ 0800

## 2010-05-28 NOTE — Procedures (Signed)
Summary: Colonoscopy   Colonoscopy  Procedure date:  09/02/2004  Findings:      Location:  [facility].    Colonoscopy  Procedure date:  09/02/2004  Findings:      Location:  [facility].   Patient Name: Harry [name], Harry [name]. MRN:  Procedure Procedures: Colonoscopy CPT: [id number].  Personnel: Endoscopist: Ulyess Mort, MD.  Exam Location: Exam performed in Outpatient Clinic. Outpatient  Patient Consent: Procedure, Alternatives, Risks and Benefits discussed, consent obtained, from patient. Consent was obtained by the RN.  Indications  Average Risk Screening Routine.  History  Current Medications: Patient is not currently taking Coumadin.  Pre-Exam Physical: Entire physical exam was normal.  Exam Exam: Extent of exam reached: Cecum, extent intended: Cecum.  The cecum was identified by appendiceal orifice and IC valve. Colon retroflexion performed. Images were not taken. ASA Classification: II. Tolerance: good.  Monitoring: Pulse and BP monitoring, Oximetry used. Supplemental O2 given.  Colon Prep Prep results: fair, exam compromised.  Sedation Meds: Patient assessed and found to be appropriate for moderate (conscious) sedation. Fentanyl 100 mcg. given IV. Versed 8 mg. given IV.  Findings - DIVERTICULOSIS: Cecum to Rectum. ICD9: Diverticulosis: 562.10. Comments: mild.   Assessment Abnormal examination, see findings above.  Diagnoses: 562.10: Diverticulosis.   Events  Unplanned Interventions: No intervention was required.  Unplanned Events: There were no complications. Plans Patient Education: Patient given standard instructions for: Diverticulosis. Yearly hemoccult testing recommended. Patient instructed to get routine colonoscopy every 7 years. needs much better prep next exam.  Disposition: After procedure patient sent to recovery. After recovery patient sent home.   This report was created from the original endoscopy  report, which was reviewed and signed by the above listed endoscopist.   Appended Document: Colonoscopy ENCOURAGE STRICT PREP ADHERENCE PER PRIOR POOR PREP....  Appended Document: Colonoscopy Contacted pt per Dr. Norval Gable instructions to schedule colonoscopy in March 2012 w/ Propofol.  March schedule is not available.  Pt will call back in one week to schedule colonoscopy.  Appended Document: Colonoscopy per Pt he does not wish to have MAC he will check his calander and call back with a date.

## 2010-06-03 ENCOUNTER — Encounter: Payer: Self-pay | Admitting: Internal Medicine

## 2010-06-03 ENCOUNTER — Ambulatory Visit (INDEPENDENT_AMBULATORY_CARE_PROVIDER_SITE_OTHER): Payer: BC Managed Care – PPO

## 2010-06-03 DIAGNOSIS — J301 Allergic rhinitis due to pollen: Secondary | ICD-10-CM

## 2010-06-05 NOTE — Progress Notes (Addendum)
Summary: flu vaccine/ fax request from dr Haroldine Laws  Phone Note Call from Patient   Caller: teresa calling for pt dr Haroldine Laws Call For: young Summary of Call: pt needs proof of flu vaccine faxed asap to attn: sue hampton at [facility] [phone]. teresa's contact # is [phone] Initial call taken by: Tivis Ringer, CNA,  May 28, 2010 1:40 PM  Follow-up for Phone Call        I have faxed the requested information.Reynaldo Minium CMA  May 28, 2010 2:35 PM  Rosey Bath is aware and will inform Dr. Haroldine Laws.     Appended Document: flu vaccine/ fax request from dr Haroldine Laws pt called to relay message that information on Flu shot was from last year(2010). Tammy Scott in allergy is locating information for this fall/flu season.

## 2010-06-06 ENCOUNTER — Encounter (INDEPENDENT_AMBULATORY_CARE_PROVIDER_SITE_OTHER): Payer: Self-pay

## 2010-06-10 ENCOUNTER — Ambulatory Visit (INDEPENDENT_AMBULATORY_CARE_PROVIDER_SITE_OTHER): Payer: BC Managed Care – PPO

## 2010-06-10 ENCOUNTER — Encounter: Payer: Self-pay | Admitting: Gastroenterology

## 2010-06-10 DIAGNOSIS — J301 Allergic rhinitis due to pollen: Secondary | ICD-10-CM

## 2010-06-11 NOTE — Assessment & Plan Note (Signed)
Summary: FLU SHOT  Nurse Visit   Allergies: No Known Drug Allergies  Immunizations Administered:  Influenza Vaccine # 1:    Vaccine Type: Fluvax 3+    Site: right deltoid    Mfr: GlaxoSmithKline    Dose: 0.5 ml    Route: IM    Given by: Tammy Scott    Exp. Date: 10/18/2010    Lot #: [id number]  Flu Vaccine Consent Questions:    Do you have a history of severe allergic reactions to this vaccine? no    Any prior history of allergic reactions to egg and/or gelatin? no    Do you have a sensitivity to the preservative Thimersol? no    Do you have a past history of Guillan-Barre Syndrome? no    Do you currently have an acute febrile illness? no    Have you ever had a severe reaction to latex? no    Vaccine information given and explained to patient? yes  Orders Added: 1)  Flu Vaccine 49yrs + [90658] 2)  Admin 1st Vaccine [90471]  Appended Document: FLU SHOT Injection given to patient on 01-22-2010. Vivianne Spence

## 2010-06-17 ENCOUNTER — Ambulatory Visit (INDEPENDENT_AMBULATORY_CARE_PROVIDER_SITE_OTHER): Payer: BC Managed Care – PPO

## 2010-06-17 DIAGNOSIS — J301 Allergic rhinitis due to pollen: Secondary | ICD-10-CM

## 2010-06-17 NOTE — Letter (Signed)
Summary: [facility] Instructions  [facility]  [address] [place], [place] [address]   Phone: [phone]  Fax: [phone]       Harry [name]    November 26, 1940    MRN: [id number]        Procedure Day /Date:  Friday 06/27/2010     Arrival Time: 12:30 pm     Procedure Time: 1:30 pm     Location of Procedure:                    _x _  [facility] (4th Floor)                        PREPARATION FOR COLONOSCOPY WITH MOVIPREP   Starting 5 days prior to your procedure Sunday 3/4 do not eat nuts, seeds, popcorn, corn, beans, peas,  salads, or any raw vegetables.  Do not take any fiber supplements (e.g. Metamucil, Citrucel, and Benefiber).  THE DAY BEFORE YOUR PROCEDURE         DATE: Thursday 3/8  1.  Drink clear liquids the entire day-NO SOLID FOOD  2.  Do not drink anything colored red or purple.  Avoid juices with pulp.  No orange juice.  3.  Drink at least 64 oz. (8 glasses) of fluid/clear liquids during the day to prevent dehydration and help the prep work efficiently.  CLEAR LIQUIDS INCLUDE: Water Jello Ice Popsicles Tea (sugar ok, no milk/cream) Powdered fruit flavored drinks Coffee (sugar ok, no milk/cream) Gatorade Juice: apple, white grape, white cranberry  Lemonade Clear bullion, consomm, broth Carbonated beverages (any kind) Strained chicken noodle soup Hard Candy                             4.  In the morning, mix first dose of MoviPrep solution:    Empty 1 Pouch A and 1 Pouch B into the disposable container    Add lukewarm drinking water to the top line of the container. Mix to dissolve    Refrigerate (mixed solution should be used within 24 hrs)  5.  Begin drinking the prep at 5:00 p.m. The MoviPrep container is divided by 4 marks.   Every 15 minutes drink the solution down to the next mark (approximately 8 oz) until the full liter is complete.   6.  Follow completed prep with 16 oz of clear liquid of your choice (Nothing red  or purple).  Continue to drink clear liquids until bedtime.  7.  Before going to bed, mix second dose of MoviPrep solution:    Empty 1 Pouch A and 1 Pouch B into the disposable container    Add lukewarm drinking water to the top line of the container. Mix to dissolve    Refrigerate  THE DAY OF YOUR PROCEDURE      DATE: Friday 3/9  Beginning at 8:30 a.m. (5 hours before procedure):         1. Every 15 minutes, drink the solution down to the next mark (approx 8 oz) until the full liter is complete.  2. Follow completed prep with 16 oz. of clear liquid of your choice.    3. You may drink clear liquids until 11:30 am  (2 HOURS BEFORE PROCEDURE).   MEDICATION INSTRUCTIONS  Unless otherwise instructed, you should take regular prescription medications with a small sip of water   as early as possible the morning of  your procedure.         OTHER INSTRUCTIONS  You will need a responsible adult at least 71 years of age to accompany you and drive you home.   This person must remain in the waiting room during your procedure.  Wear loose fitting clothing that is easily removed.  Leave jewelry and other valuables at home.  However, you may wish to bring a book to read or  an iPod/MP3 player to listen to music as you wait for your procedure to start.  Remove all body piercing jewelry and leave at home.  Total time from sign-in until discharge is approximately 2-3 hours.  You should go home directly after your procedure and rest.  You can resume normal activities the  day after your procedure.  The day of your procedure you should not:   Drive   Make legal decisions   Operate machinery   Drink alcohol   Return to work  You will receive specific instructions about eating, activities and medications before you leave.    The above instructions have been reviewed and explained to me by   Ulis Rias RN  June 10, 2010 8:23 AM     I fully understand and can verbalize  these instructions _____________________________ Date _________

## 2010-06-17 NOTE — Miscellaneous (Signed)
Summary: Lec previsit  Clinical Lists Changes  Medications: Added new medication of MOVIPREP 100 GM  SOLR (PEG-KCL-NACL-NASULF-NA ASC-C) As per prep instructions. - Signed Rx of MOVIPREP 100 GM  SOLR (PEG-KCL-NACL-NASULF-NA ASC-C) As per prep instructions.;  #1 x 0;  Signed;  Entered by: Ulis Rias RN;  Authorized by: Mardella Layman MD [facility];  Method used: Electronically to [facility] [address] [place], [place]  [id number], Ph: [phone] or [phone], Fax: [phone] Observations: Added new observation of NKA: T (06/10/2010 8:03)    Prescriptions: MOVIPREP 100 GM  SOLR (PEG-KCL-NACL-NASULF-NA ASC-C) As per prep instructions.  #1 x 0   Entered by:   Ulis Rias RN   Authorized by:   Mardella Layman MD [facility]   Signed by:   Ulis Rias RN on 06/10/2010   Method used:   Electronically to        [facility] (retail)       [address]       [place], [place]  [id number]       Ph: [phone] or [phone]       Fax: [phone]   RxID:   [id number]

## 2010-06-27 ENCOUNTER — Encounter (AMBULATORY_SURGERY_CENTER): Payer: 59 | Admitting: Gastroenterology

## 2010-06-27 ENCOUNTER — Ambulatory Visit: Payer: BC Managed Care – PPO

## 2010-06-27 ENCOUNTER — Encounter: Payer: Self-pay | Admitting: Gastroenterology

## 2010-06-27 DIAGNOSIS — Z1211 Encounter for screening for malignant neoplasm of colon: Secondary | ICD-10-CM

## 2010-06-30 ENCOUNTER — Encounter: Payer: Self-pay | Admitting: Internal Medicine

## 2010-06-30 ENCOUNTER — Ambulatory Visit (INDEPENDENT_AMBULATORY_CARE_PROVIDER_SITE_OTHER): Payer: 59

## 2010-06-30 DIAGNOSIS — J301 Allergic rhinitis due to pollen: Secondary | ICD-10-CM

## 2010-06-30 DIAGNOSIS — J302 Other seasonal allergic rhinitis: Secondary | ICD-10-CM | POA: Insufficient documentation

## 2010-06-30 DIAGNOSIS — J3089 Other allergic rhinitis: Secondary | ICD-10-CM | POA: Insufficient documentation

## 2010-07-01 NOTE — Procedures (Addendum)
Summary: Colonoscopy  Patient: [name] Note: All result statuses are Final unless otherwise noted.  Tests: (1) Colonoscopy (COL)   COL Colonoscopy           DONE     [facility]     [address]     [place], [place]  [address]          COLONOSCOPY PROCEDURE REPORT          PATIENT:  Harry [name], Harry [name]  MR#:  [id number]     BIRTHDATE:  July 31, 1940, 69 yrs. old  GENDER:  male     ENDOSCOPIST:  Vania Rea. Jarold Motto, MD, [facility]     REF. BY:     PROCEDURE DATE:  06/27/2010     PROCEDURE:  Average-risk screening colonoscopy     G0121     ASA CLASS:  Class I     INDICATIONS:  Routine Risk Screening     MEDICATIONS:   Fentanyl 75 mcg IV, Versed 8 mg IV          DESCRIPTION OF PROCEDURE:   After the risks benefits and     alternatives of the procedure were thoroughly explained, informed     consent was obtained.  Digital rectal exam was performed and     revealed no abnormalities.   The LB 180AL [id number] endoscope was     introduced through the anus and advanced to the cecum, which was     identified by both the appendix and ileocecal valve, without     limitations.  The quality of the prep was adequate, using     MoviPrep.  The instrument was then slowly withdrawn as the colon     was fully examined.     <<PROCEDUREIMAGES>>          FINDINGS:  No polyps or cancers were seen.  This was otherwise a     normal examination of the colon.   Retroflexed views in the rectum     revealed no abnormalities.    The scope was then withdrawn from     the patient and the procedure completed.          COMPLICATIONS:  None     ENDOSCOPIC IMPRESSION:     1) No polyps or cancers     2) Otherwise normal examination     RECOMMENDATIONS:     1) Continue current colorectal screening recommendations for     "routine risk" patients with a repeat colonoscopy in 10 years.     REPEAT EXAM:  No          ______________________________     Vania Rea. Jarold Motto, MD, [facility]          CC:  Cassell Clement, MD          n.     Rosalie DoctorMarland Kitchen   Vania Rea. Patterson at 06/27/2010 02:04 PM          Hermelinda Medicus, [id number]  Note: An exclamation mark (!) indicates a result that was not dispersed into the flowsheet. Document Creation Date: 06/27/2010 2:04 PM _______________________________________________________________________  (1) Order result status: Final Collection or observation date-time: 06/27/2010 13:59 Requested date-time:  Receipt date-time:  Reported date-time:  Referring Physician:   Ordering Physician: Sheryn Bison [phone]) Specimen Source:  Source: Launa Grill Order Number: [id number] Lab site:   Appended Document: Colonoscopy    Clinical Lists Changes  Observations: Added new observation of COLONNXTDUE: 06/2020 (06/27/2010 14:22)

## 2010-07-08 ENCOUNTER — Ambulatory Visit (INDEPENDENT_AMBULATORY_CARE_PROVIDER_SITE_OTHER): Payer: 59

## 2010-07-08 DIAGNOSIS — J301 Allergic rhinitis due to pollen: Secondary | ICD-10-CM

## 2010-07-08 NOTE — Assessment & Plan Note (Signed)
Summary: allergy/cb  Nurse Visit   Allergies: No Known Drug Allergies  Orders Added: 1)  Allergy Injection (1) [95115] 

## 2010-07-11 ENCOUNTER — Telehealth: Payer: Self-pay | Admitting: *Deleted

## 2010-07-11 DIAGNOSIS — R198 Other specified symptoms and signs involving the digestive system and abdomen: Secondary | ICD-10-CM

## 2010-07-11 MED ORDER — METRONIDAZOLE 500 MG PO TABS: 500.0000 mg | ORAL_TABLET | Freq: Two times a day (BID) | ORAL | Status: DC

## 2010-07-11 NOTE — Telephone Encounter (Signed)
Agree with plan 

## 2010-07-11 NOTE — Telephone Encounter (Signed)
Patient phoned and spoke with Dr. Patty Sermons c/o GI problems.  Will rx flagyl 500 gm po bid

## 2010-07-15 ENCOUNTER — Ambulatory Visit (INDEPENDENT_AMBULATORY_CARE_PROVIDER_SITE_OTHER): Payer: 59

## 2010-07-15 DIAGNOSIS — J301 Allergic rhinitis due to pollen: Secondary | ICD-10-CM

## 2010-07-21 ENCOUNTER — Ambulatory Visit (INDEPENDENT_AMBULATORY_CARE_PROVIDER_SITE_OTHER): Payer: 59

## 2010-07-21 DIAGNOSIS — J301 Allergic rhinitis due to pollen: Secondary | ICD-10-CM

## 2010-08-04 ENCOUNTER — Ambulatory Visit (INDEPENDENT_AMBULATORY_CARE_PROVIDER_SITE_OTHER): Payer: 59

## 2010-08-04 DIAGNOSIS — J309 Allergic rhinitis, unspecified: Secondary | ICD-10-CM

## 2010-08-05 ENCOUNTER — Ambulatory Visit (INDEPENDENT_AMBULATORY_CARE_PROVIDER_SITE_OTHER): Payer: 59

## 2010-08-05 DIAGNOSIS — J309 Allergic rhinitis, unspecified: Secondary | ICD-10-CM

## 2010-08-12 ENCOUNTER — Ambulatory Visit (INDEPENDENT_AMBULATORY_CARE_PROVIDER_SITE_OTHER): Payer: 59

## 2010-08-12 DIAGNOSIS — J309 Allergic rhinitis, unspecified: Secondary | ICD-10-CM

## 2010-08-19 ENCOUNTER — Other Ambulatory Visit: Payer: Self-pay | Admitting: *Deleted

## 2010-08-19 ENCOUNTER — Ambulatory Visit (INDEPENDENT_AMBULATORY_CARE_PROVIDER_SITE_OTHER): Payer: 59

## 2010-08-19 DIAGNOSIS — J309 Allergic rhinitis, unspecified: Secondary | ICD-10-CM

## 2010-08-19 DIAGNOSIS — Z792 Long term (current) use of antibiotics: Secondary | ICD-10-CM

## 2010-08-19 MED ORDER — CEFUROXIME AXETIL 250 MG PO TABS: 250.0000 mg | ORAL_TABLET | Freq: Two times a day (BID) | ORAL | Status: DC

## 2010-08-19 NOTE — Telephone Encounter (Signed)
Patient phoned and is going [place] the [place].  Requested rx to be filled

## 2010-08-19 NOTE — Telephone Encounter (Signed)
Agree with plan 

## 2010-09-04 ENCOUNTER — Ambulatory Visit (INDEPENDENT_AMBULATORY_CARE_PROVIDER_SITE_OTHER): Payer: 59

## 2010-09-04 DIAGNOSIS — J309 Allergic rhinitis, unspecified: Secondary | ICD-10-CM

## 2010-09-05 NOTE — Op Note (Signed)
NAMEJAHZIAH, Harry [name]              ACCOUNT NO.:  [id number]   MEDICAL RECORD NO.:  [id number]          PATIENT TYPE:  AMB   LOCATION:  DSC                          FACILITY:  [facility]   PHYSICIAN:  Christopher E. Ezzard Standing, M.D.DATE OF BIRTH:  1940/08/01   DATE OF PROCEDURE:  01/31/2004  DATE OF DISCHARGE:                                 OPERATIVE REPORT   PREOPERATIVE DIAGNOSIS:  Bilateral ethmoid maxillary sinus disease with  polypoid changes, chronic.   POSTOPERATIVE DIAGNOSIS:  Bilateral ethmoid maxillary sinus disease with  polypoid changes, chronic.   OPERATION:  Functional endoscopic sinus surgery with bilateral total  ethmoidectomies.  Bilateral maxillary ostial enlargements with removal of  polypoid disease.   SURGEON:  Kristine Garbe. Ezzard Standing, M.D.   ANESTHESIA:  General anesthesia.   COMPLICATIONS:  None.   BRIEF CLINICAL NOTE:  Harry [name] is a 70 year old gentleman who has had  history of recurrent sinus disease.  He has had a long history of allergies.  This last year he has been on several rounds of antibiotics because of  recurrent sinus disease.  Repeat CT scans show opacification of bilateral  ethmoid regions as well as opacification of bilateral maxillary sinuses,  right side worse than left.  He is taken to the operating room at this time  for functional endoscopic sinus surgery.   DESCRIPTION OF PROCEDURE:  The patient was brought to the operating room  under IV sedation.  Nose was prepped with Betadine solution, was draped out  with sterile towels.  Nose was then further prepped with cotton pledgets  soaked in 4% cocaine solution and turbinate, septum and ethmoid area were  injected with Xylocaine with epinephrine for local anesthetic.  Of note, the  patient had a deviation of the septum to the left with adhesion from the  septum to the lateral nasal wall on the left side which was fairly dense and  felt to be secondary to trauma.  This adhesion was lysed  with scissors.  At  the end of the case, a sheath of Silastic was secured with a 3-0 nylon  suture placed between the lateral nasal wall and the septal adhesion on the  let side.  First the right region was approached.  The middle turbinate was  fractured medially.  The uncinate process was sharply incised with a sickle  knife.  The anterior ethmoid region was opened up with straight through cup  forceps.  The maxillary ostia was identified and there was a large polypoid  mass obstructing the left maxillary ostia.  The mass was grasped with up  biting forceps and removed.  Suction and further removal of polypoid disease  within the right maxillary sinus was performed.  Following this, the  anterior and posterior ethmoid region were opened up with straight through  forceps.  Polypoid disease, very thickened mucosa was removed.  There was no  real purulence.  A cotton pledget soaked in 4% cocaine solution was placed  for hemostasis.  Following this, the left side was approached.  Again the  uncinate  process was sharply incised with a sickle knife, was  removed with  straight through cup forceps.  The anterior ethmoid area was opened up with  through cup forceps.  The maxillary ostia was identified in the left and was  enlarged with back biting and straight through cup forceps to approximately  1 cm size.  The left maxillary sinus had some mucoperiosteal thickening but  no real polypoid mass  could be visualized.  There was no purulence within  the sinus.  Following this, the anterior and posterior ethmoid cells were  opened up with straight through cup forceps.  There was a polypoid disease  throughout the anterior as well as the posterior ethmoid region.  Likewise  polypoid disease up in the nasal frontal region removed with up biting cup  forceps.  Roof of the ethmoid was identified on the left side as was the  lamina.  This completed the procedure.  __________ sinus packs were placed   within  the middle meatus bilaterally and hydrated with Afrin.  A 0.3 size  sheet of Silastic was secured to either side of the septum where the  adhesion was on the left side with a 3-0 nylon suture.  Harry [name] was awakened  from anesthesia and transferred to the recovery room postoperatively doing  well.  Of note, the patient received 1 g Ancef  IV preoperatively as well as  10 mg of Decadron IV preoperatively.   DISPOSITION:  Harry [name] is discharged home later this morning on Vicodin and  Tylenol p.r.n. pain, Keflex 500 mg b.i.d. for a week.  Will have him follow  up in my office in four to five days to have the __________ sinus packs  removed.       CEN/MEDQ  D:  01/31/2004  T:  01/31/2004  Job:  [id number]

## 2010-09-09 ENCOUNTER — Ambulatory Visit (INDEPENDENT_AMBULATORY_CARE_PROVIDER_SITE_OTHER): Payer: 59

## 2010-09-09 DIAGNOSIS — J309 Allergic rhinitis, unspecified: Secondary | ICD-10-CM

## 2010-09-16 ENCOUNTER — Ambulatory Visit (INDEPENDENT_AMBULATORY_CARE_PROVIDER_SITE_OTHER): Payer: 59

## 2010-09-16 DIAGNOSIS — J309 Allergic rhinitis, unspecified: Secondary | ICD-10-CM

## 2010-09-23 ENCOUNTER — Ambulatory Visit (INDEPENDENT_AMBULATORY_CARE_PROVIDER_SITE_OTHER): Payer: 59

## 2010-09-23 DIAGNOSIS — J309 Allergic rhinitis, unspecified: Secondary | ICD-10-CM

## 2010-09-24 ENCOUNTER — Ambulatory Visit (INDEPENDENT_AMBULATORY_CARE_PROVIDER_SITE_OTHER): Payer: 59

## 2010-09-24 DIAGNOSIS — J309 Allergic rhinitis, unspecified: Secondary | ICD-10-CM

## 2010-09-30 ENCOUNTER — Ambulatory Visit (INDEPENDENT_AMBULATORY_CARE_PROVIDER_SITE_OTHER): Payer: 59

## 2010-09-30 DIAGNOSIS — J309 Allergic rhinitis, unspecified: Secondary | ICD-10-CM

## 2010-10-10 ENCOUNTER — Ambulatory Visit (INDEPENDENT_AMBULATORY_CARE_PROVIDER_SITE_OTHER): Payer: 59

## 2010-10-10 DIAGNOSIS — J309 Allergic rhinitis, unspecified: Secondary | ICD-10-CM

## 2010-10-14 ENCOUNTER — Ambulatory Visit (INDEPENDENT_AMBULATORY_CARE_PROVIDER_SITE_OTHER): Payer: 59

## 2010-10-14 DIAGNOSIS — J309 Allergic rhinitis, unspecified: Secondary | ICD-10-CM

## 2010-10-28 ENCOUNTER — Ambulatory Visit (INDEPENDENT_AMBULATORY_CARE_PROVIDER_SITE_OTHER): Payer: 59

## 2010-10-28 DIAGNOSIS — J309 Allergic rhinitis, unspecified: Secondary | ICD-10-CM

## 2010-11-03 ENCOUNTER — Ambulatory Visit (INDEPENDENT_AMBULATORY_CARE_PROVIDER_SITE_OTHER): Payer: 59

## 2010-11-03 DIAGNOSIS — J309 Allergic rhinitis, unspecified: Secondary | ICD-10-CM

## 2010-11-18 ENCOUNTER — Encounter: Payer: Self-pay | Admitting: Internal Medicine

## 2010-11-26 ENCOUNTER — Ambulatory Visit (INDEPENDENT_AMBULATORY_CARE_PROVIDER_SITE_OTHER): Payer: 59

## 2010-11-26 DIAGNOSIS — J309 Allergic rhinitis, unspecified: Secondary | ICD-10-CM

## 2010-12-01 ENCOUNTER — Ambulatory Visit (INDEPENDENT_AMBULATORY_CARE_PROVIDER_SITE_OTHER): Payer: 59

## 2010-12-01 DIAGNOSIS — J309 Allergic rhinitis, unspecified: Secondary | ICD-10-CM

## 2010-12-15 ENCOUNTER — Ambulatory Visit (INDEPENDENT_AMBULATORY_CARE_PROVIDER_SITE_OTHER): Payer: 59

## 2010-12-15 DIAGNOSIS — J309 Allergic rhinitis, unspecified: Secondary | ICD-10-CM

## 2010-12-29 ENCOUNTER — Ambulatory Visit (INDEPENDENT_AMBULATORY_CARE_PROVIDER_SITE_OTHER): Payer: 59

## 2010-12-29 DIAGNOSIS — J309 Allergic rhinitis, unspecified: Secondary | ICD-10-CM

## 2010-12-30 ENCOUNTER — Ambulatory Visit (INDEPENDENT_AMBULATORY_CARE_PROVIDER_SITE_OTHER): Payer: 59

## 2010-12-30 DIAGNOSIS — J309 Allergic rhinitis, unspecified: Secondary | ICD-10-CM

## 2011-01-05 ENCOUNTER — Ambulatory Visit (INDEPENDENT_AMBULATORY_CARE_PROVIDER_SITE_OTHER): Payer: 59

## 2011-01-05 DIAGNOSIS — J309 Allergic rhinitis, unspecified: Secondary | ICD-10-CM

## 2011-01-12 ENCOUNTER — Ambulatory Visit (INDEPENDENT_AMBULATORY_CARE_PROVIDER_SITE_OTHER): Payer: 59

## 2011-01-12 DIAGNOSIS — J309 Allergic rhinitis, unspecified: Secondary | ICD-10-CM

## 2011-01-20 ENCOUNTER — Ambulatory Visit (INDEPENDENT_AMBULATORY_CARE_PROVIDER_SITE_OTHER): Payer: 59

## 2011-01-20 DIAGNOSIS — J309 Allergic rhinitis, unspecified: Secondary | ICD-10-CM

## 2011-01-26 ENCOUNTER — Telehealth: Payer: Self-pay | Admitting: Cardiology

## 2011-01-26 MED ORDER — AZITHROMYCIN 1 G PO PACK: 1.0000 | PACK | Freq: Once | ORAL | Status: AC

## 2011-01-26 NOTE — Telephone Encounter (Signed)
Lawson Fiscal NP reviewed pt record. Recommended zithromycin pack and otc mucinex

## 2011-01-26 NOTE — Telephone Encounter (Signed)
Pt is sick with cold/cough/no fever but wants antibodic called in.  Call into [facility] Out [facility]. Phar.  Please call him back regarding same.  [phone] is another number.

## 2011-01-28 ENCOUNTER — Ambulatory Visit (INDEPENDENT_AMBULATORY_CARE_PROVIDER_SITE_OTHER): Payer: 59

## 2011-01-28 DIAGNOSIS — J309 Allergic rhinitis, unspecified: Secondary | ICD-10-CM

## 2011-02-09 ENCOUNTER — Ambulatory Visit (INDEPENDENT_AMBULATORY_CARE_PROVIDER_SITE_OTHER): Payer: 59

## 2011-02-09 DIAGNOSIS — J309 Allergic rhinitis, unspecified: Secondary | ICD-10-CM

## 2011-02-09 DIAGNOSIS — Z23 Encounter for immunization: Secondary | ICD-10-CM

## 2011-02-20 ENCOUNTER — Ambulatory Visit (INDEPENDENT_AMBULATORY_CARE_PROVIDER_SITE_OTHER): Payer: 59

## 2011-02-20 DIAGNOSIS — J309 Allergic rhinitis, unspecified: Secondary | ICD-10-CM

## 2011-02-24 ENCOUNTER — Ambulatory Visit (INDEPENDENT_AMBULATORY_CARE_PROVIDER_SITE_OTHER): Payer: 59

## 2011-02-24 DIAGNOSIS — J309 Allergic rhinitis, unspecified: Secondary | ICD-10-CM

## 2011-03-02 ENCOUNTER — Ambulatory Visit (INDEPENDENT_AMBULATORY_CARE_PROVIDER_SITE_OTHER): Payer: 59

## 2011-03-02 DIAGNOSIS — J309 Allergic rhinitis, unspecified: Secondary | ICD-10-CM

## 2011-03-10 ENCOUNTER — Ambulatory Visit (INDEPENDENT_AMBULATORY_CARE_PROVIDER_SITE_OTHER): Payer: 59

## 2011-03-10 DIAGNOSIS — J309 Allergic rhinitis, unspecified: Secondary | ICD-10-CM

## 2011-03-16 ENCOUNTER — Encounter: Payer: Self-pay | Admitting: Internal Medicine

## 2011-03-17 ENCOUNTER — Ambulatory Visit (INDEPENDENT_AMBULATORY_CARE_PROVIDER_SITE_OTHER): Payer: 59

## 2011-03-17 DIAGNOSIS — J309 Allergic rhinitis, unspecified: Secondary | ICD-10-CM

## 2011-03-31 ENCOUNTER — Other Ambulatory Visit: Payer: Self-pay | Admitting: *Deleted

## 2011-03-31 DIAGNOSIS — R198 Other specified symptoms and signs involving the digestive system and abdomen: Secondary | ICD-10-CM

## 2011-03-31 DIAGNOSIS — Z792 Long term (current) use of antibiotics: Secondary | ICD-10-CM

## 2011-03-31 MED ORDER — CEFUROXIME AXETIL 250 MG PO TABS: 250.0000 mg | ORAL_TABLET | Freq: Two times a day (BID) | ORAL | Status: DC

## 2011-03-31 MED ORDER — METRONIDAZOLE 500 MG PO TABS: 500.0000 mg | ORAL_TABLET | Freq: Two times a day (BID) | ORAL | Status: DC

## 2011-03-31 NOTE — Telephone Encounter (Signed)
Patient phoned and spoke with  Dr. Patty Sermons.  Will send Rx's as requested

## 2011-04-01 ENCOUNTER — Ambulatory Visit (INDEPENDENT_AMBULATORY_CARE_PROVIDER_SITE_OTHER): Payer: 59

## 2011-04-01 DIAGNOSIS — J309 Allergic rhinitis, unspecified: Secondary | ICD-10-CM

## 2011-04-08 ENCOUNTER — Ambulatory Visit (INDEPENDENT_AMBULATORY_CARE_PROVIDER_SITE_OTHER): Payer: 59

## 2011-04-08 DIAGNOSIS — J309 Allergic rhinitis, unspecified: Secondary | ICD-10-CM

## 2011-04-22 ENCOUNTER — Ambulatory Visit (INDEPENDENT_AMBULATORY_CARE_PROVIDER_SITE_OTHER): Payer: 59

## 2011-04-22 DIAGNOSIS — J309 Allergic rhinitis, unspecified: Secondary | ICD-10-CM

## 2011-04-28 ENCOUNTER — Ambulatory Visit (INDEPENDENT_AMBULATORY_CARE_PROVIDER_SITE_OTHER): Payer: 59

## 2011-04-28 DIAGNOSIS — J309 Allergic rhinitis, unspecified: Secondary | ICD-10-CM

## 2011-04-30 ENCOUNTER — Telehealth: Payer: Self-pay | Admitting: Internal Medicine

## 2011-04-30 NOTE — Telephone Encounter (Signed)
Spoke with Harry [name]-stated she needed documentation of flu shot-We have no record of Flu shot for this season;only have 06-03-10 on file as most recent. She requested I fax that information to her at [phone]. Faxed as requested.

## 2011-05-01 ENCOUNTER — Telehealth: Payer: Self-pay | Admitting: *Deleted

## 2011-05-01 DIAGNOSIS — E785 Hyperlipidemia, unspecified: Secondary | ICD-10-CM

## 2011-05-01 MED ORDER — ATORVASTATIN CALCIUM 20 MG PO TABS: 20.0000 mg | ORAL_TABLET | Freq: Every day | ORAL | Status: DC

## 2011-05-01 NOTE — Telephone Encounter (Signed)
Patient called and requested Rx be phoned in

## 2011-05-01 NOTE — Telephone Encounter (Signed)
I will check with him.  Thanks for calling in the Lipitor.

## 2011-05-05 ENCOUNTER — Ambulatory Visit (INDEPENDENT_AMBULATORY_CARE_PROVIDER_SITE_OTHER): Payer: 59

## 2011-05-05 DIAGNOSIS — J309 Allergic rhinitis, unspecified: Secondary | ICD-10-CM

## 2011-05-12 ENCOUNTER — Ambulatory Visit (INDEPENDENT_AMBULATORY_CARE_PROVIDER_SITE_OTHER): Payer: 59

## 2011-05-12 DIAGNOSIS — J309 Allergic rhinitis, unspecified: Secondary | ICD-10-CM

## 2011-05-19 ENCOUNTER — Ambulatory Visit (INDEPENDENT_AMBULATORY_CARE_PROVIDER_SITE_OTHER): Payer: 59

## 2011-05-19 DIAGNOSIS — J309 Allergic rhinitis, unspecified: Secondary | ICD-10-CM

## 2011-05-26 ENCOUNTER — Ambulatory Visit (INDEPENDENT_AMBULATORY_CARE_PROVIDER_SITE_OTHER): Payer: 59

## 2011-05-26 DIAGNOSIS — J309 Allergic rhinitis, unspecified: Secondary | ICD-10-CM

## 2011-06-02 ENCOUNTER — Ambulatory Visit (INDEPENDENT_AMBULATORY_CARE_PROVIDER_SITE_OTHER): Payer: 59

## 2011-06-02 DIAGNOSIS — J309 Allergic rhinitis, unspecified: Secondary | ICD-10-CM

## 2011-06-09 ENCOUNTER — Ambulatory Visit (INDEPENDENT_AMBULATORY_CARE_PROVIDER_SITE_OTHER): Payer: 59

## 2011-06-09 DIAGNOSIS — J309 Allergic rhinitis, unspecified: Secondary | ICD-10-CM

## 2011-06-10 ENCOUNTER — Telehealth: Payer: Self-pay | Admitting: *Deleted

## 2011-06-10 DIAGNOSIS — M2550 Pain in unspecified joint: Secondary | ICD-10-CM

## 2011-06-10 DIAGNOSIS — R059 Cough, unspecified: Secondary | ICD-10-CM

## 2011-06-10 DIAGNOSIS — R05 Cough: Secondary | ICD-10-CM

## 2011-06-10 DIAGNOSIS — Z792 Long term (current) use of antibiotics: Secondary | ICD-10-CM

## 2011-06-10 MED ORDER — CEFUROXIME AXETIL 250 MG PO TABS: 250.0000 mg | ORAL_TABLET | Freq: Two times a day (BID) | ORAL | Status: DC

## 2011-06-10 MED ORDER — MELOXICAM 15 MG PO TABS: 15.0000 mg | ORAL_TABLET | Freq: Every day | ORAL | Status: DC

## 2011-06-10 NOTE — Telephone Encounter (Signed)
Per phone conversation between  Dr. Patty Sermons will Rx Ceftin 250 mg twice daily for cough and Mobic 15 mg daily for arthritic like pain

## 2011-06-17 ENCOUNTER — Ambulatory Visit (INDEPENDENT_AMBULATORY_CARE_PROVIDER_SITE_OTHER): Payer: 59

## 2011-06-17 DIAGNOSIS — J309 Allergic rhinitis, unspecified: Secondary | ICD-10-CM

## 2011-06-23 ENCOUNTER — Ambulatory Visit (INDEPENDENT_AMBULATORY_CARE_PROVIDER_SITE_OTHER): Payer: 59

## 2011-06-23 DIAGNOSIS — J309 Allergic rhinitis, unspecified: Secondary | ICD-10-CM

## 2011-06-29 ENCOUNTER — Telehealth: Payer: Self-pay | Admitting: Internal Medicine

## 2011-06-29 MED ORDER — PREDNISONE 10 MG PO TABS: ORAL_TABLET | ORAL | Status: DC

## 2011-06-29 NOTE — Telephone Encounter (Signed)
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 Having increased burning and watering of eyes and nose, nasal congestion. Blames early Spring tree pollens. Continues allergy vaccine. Not wheezing. Prednisone to [facility] pharmacy.

## 2011-07-07 ENCOUNTER — Encounter: Payer: Self-pay | Admitting: Internal Medicine

## 2011-07-07 ENCOUNTER — Ambulatory Visit (INDEPENDENT_AMBULATORY_CARE_PROVIDER_SITE_OTHER): Payer: 59

## 2011-07-07 DIAGNOSIS — J309 Allergic rhinitis, unspecified: Secondary | ICD-10-CM

## 2011-07-15 ENCOUNTER — Ambulatory Visit (INDEPENDENT_AMBULATORY_CARE_PROVIDER_SITE_OTHER): Payer: 59

## 2011-07-15 DIAGNOSIS — J309 Allergic rhinitis, unspecified: Secondary | ICD-10-CM

## 2011-07-16 ENCOUNTER — Encounter: Payer: Self-pay | Admitting: *Deleted

## 2011-07-27 ENCOUNTER — Ambulatory Visit (INDEPENDENT_AMBULATORY_CARE_PROVIDER_SITE_OTHER): Payer: 59

## 2011-07-27 DIAGNOSIS — J309 Allergic rhinitis, unspecified: Secondary | ICD-10-CM

## 2011-07-28 ENCOUNTER — Ambulatory Visit (INDEPENDENT_AMBULATORY_CARE_PROVIDER_SITE_OTHER): Payer: 59

## 2011-07-28 DIAGNOSIS — J309 Allergic rhinitis, unspecified: Secondary | ICD-10-CM

## 2011-08-04 ENCOUNTER — Ambulatory Visit (INDEPENDENT_AMBULATORY_CARE_PROVIDER_SITE_OTHER): Payer: 59

## 2011-08-04 DIAGNOSIS — J309 Allergic rhinitis, unspecified: Secondary | ICD-10-CM

## 2011-08-11 ENCOUNTER — Ambulatory Visit (INDEPENDENT_AMBULATORY_CARE_PROVIDER_SITE_OTHER): Payer: 59

## 2011-08-11 DIAGNOSIS — J309 Allergic rhinitis, unspecified: Secondary | ICD-10-CM

## 2011-08-19 ENCOUNTER — Ambulatory Visit (INDEPENDENT_AMBULATORY_CARE_PROVIDER_SITE_OTHER): Payer: 59

## 2011-08-19 DIAGNOSIS — J309 Allergic rhinitis, unspecified: Secondary | ICD-10-CM

## 2011-08-25 ENCOUNTER — Ambulatory Visit (INDEPENDENT_AMBULATORY_CARE_PROVIDER_SITE_OTHER): Payer: 59

## 2011-08-25 DIAGNOSIS — J309 Allergic rhinitis, unspecified: Secondary | ICD-10-CM

## 2011-09-01 ENCOUNTER — Ambulatory Visit (INDEPENDENT_AMBULATORY_CARE_PROVIDER_SITE_OTHER): Payer: 59

## 2011-09-01 DIAGNOSIS — J309 Allergic rhinitis, unspecified: Secondary | ICD-10-CM

## 2011-09-07 ENCOUNTER — Ambulatory Visit (INDEPENDENT_AMBULATORY_CARE_PROVIDER_SITE_OTHER): Payer: 59

## 2011-09-07 ENCOUNTER — Telehealth: Payer: Self-pay | Admitting: *Deleted

## 2011-09-07 DIAGNOSIS — J309 Allergic rhinitis, unspecified: Secondary | ICD-10-CM

## 2011-09-07 DIAGNOSIS — M2550 Pain in unspecified joint: Secondary | ICD-10-CM

## 2011-09-07 DIAGNOSIS — R198 Other specified symptoms and signs involving the digestive system and abdomen: Secondary | ICD-10-CM

## 2011-09-07 MED ORDER — METRONIDAZOLE 500 MG PO TABS: 500.0000 mg | ORAL_TABLET | Freq: Two times a day (BID) | ORAL | Status: AC

## 2011-09-07 MED ORDER — MELOXICAM 15 MG PO TABS: 15.0000 mg | ORAL_TABLET | Freq: Every day | ORAL | Status: AC

## 2011-09-07 NOTE — Telephone Encounter (Signed)
Patient called requesting refills of Mobic and Flagyl, sent to pharmacy as requested

## 2011-09-08 ENCOUNTER — Ambulatory Visit (INDEPENDENT_AMBULATORY_CARE_PROVIDER_SITE_OTHER): Payer: 59

## 2011-09-08 DIAGNOSIS — J309 Allergic rhinitis, unspecified: Secondary | ICD-10-CM

## 2011-09-09 ENCOUNTER — Telehealth: Payer: Self-pay | Admitting: *Deleted

## 2011-09-09 DIAGNOSIS — R05 Cough: Secondary | ICD-10-CM

## 2011-09-09 DIAGNOSIS — R059 Cough, unspecified: Secondary | ICD-10-CM

## 2011-09-09 DIAGNOSIS — J329 Chronic sinusitis, unspecified: Secondary | ICD-10-CM

## 2011-09-09 MED ORDER — CEFUROXIME AXETIL 250 MG PO TABS: 250.0000 mg | ORAL_TABLET | Freq: Two times a day (BID) | ORAL | Status: DC

## 2011-09-09 NOTE — Telephone Encounter (Signed)
Patient called requesting Rx be called in for Ceftin for sinus infection.  Will send to [facility]

## 2011-09-11 ENCOUNTER — Other Ambulatory Visit: Payer: Self-pay | Admitting: Dermatology

## 2011-09-15 ENCOUNTER — Ambulatory Visit (INDEPENDENT_AMBULATORY_CARE_PROVIDER_SITE_OTHER): Payer: 59

## 2011-09-15 DIAGNOSIS — J309 Allergic rhinitis, unspecified: Secondary | ICD-10-CM

## 2011-09-29 ENCOUNTER — Ambulatory Visit (INDEPENDENT_AMBULATORY_CARE_PROVIDER_SITE_OTHER): Payer: 59

## 2011-09-29 DIAGNOSIS — J309 Allergic rhinitis, unspecified: Secondary | ICD-10-CM

## 2011-10-06 ENCOUNTER — Ambulatory Visit (INDEPENDENT_AMBULATORY_CARE_PROVIDER_SITE_OTHER): Payer: 59

## 2011-10-06 DIAGNOSIS — J309 Allergic rhinitis, unspecified: Secondary | ICD-10-CM

## 2011-10-13 ENCOUNTER — Ambulatory Visit (INDEPENDENT_AMBULATORY_CARE_PROVIDER_SITE_OTHER): Payer: 59

## 2011-10-13 DIAGNOSIS — J309 Allergic rhinitis, unspecified: Secondary | ICD-10-CM

## 2011-10-28 ENCOUNTER — Ambulatory Visit (INDEPENDENT_AMBULATORY_CARE_PROVIDER_SITE_OTHER): Payer: 59

## 2011-10-28 DIAGNOSIS — J309 Allergic rhinitis, unspecified: Secondary | ICD-10-CM

## 2011-10-29 ENCOUNTER — Telehealth: Payer: Self-pay | Admitting: *Deleted

## 2011-10-29 DIAGNOSIS — J329 Chronic sinusitis, unspecified: Secondary | ICD-10-CM

## 2011-10-29 MED ORDER — CEFUROXIME AXETIL 250 MG PO TABS: 250.0000 mg | ORAL_TABLET | Freq: Two times a day (BID) | ORAL | Status: AC

## 2011-10-29 NOTE — Telephone Encounter (Signed)
Patient called requesting Ceftin be sent to pharmacy, ok per  Dr. Patty Sermons

## 2011-10-30 ENCOUNTER — Other Ambulatory Visit: Payer: Self-pay | Admitting: *Deleted

## 2011-10-30 MED ORDER — PREDNISONE 5 MG PO TABS: ORAL_TABLET | ORAL | Status: DC

## 2011-10-30 MED ORDER — CLINDAMYCIN HCL 150 MG PO CAPS: 150.0000 mg | ORAL_CAPSULE | Freq: Three times a day (TID) | ORAL | Status: AC

## 2011-10-30 NOTE — Telephone Encounter (Signed)
Filled Prednisone dose pack and Clindamycin as requested by patient.  Ok per  Dr. Patty Sermons

## 2011-11-03 ENCOUNTER — Ambulatory Visit (INDEPENDENT_AMBULATORY_CARE_PROVIDER_SITE_OTHER): Payer: 59

## 2011-11-03 DIAGNOSIS — J309 Allergic rhinitis, unspecified: Secondary | ICD-10-CM

## 2011-11-18 ENCOUNTER — Ambulatory Visit (INDEPENDENT_AMBULATORY_CARE_PROVIDER_SITE_OTHER): Payer: 59

## 2011-11-18 DIAGNOSIS — J309 Allergic rhinitis, unspecified: Secondary | ICD-10-CM

## 2011-11-24 ENCOUNTER — Ambulatory Visit (INDEPENDENT_AMBULATORY_CARE_PROVIDER_SITE_OTHER): Payer: 59

## 2011-11-24 DIAGNOSIS — J309 Allergic rhinitis, unspecified: Secondary | ICD-10-CM

## 2011-12-01 ENCOUNTER — Encounter: Payer: Self-pay | Admitting: Internal Medicine

## 2011-12-14 ENCOUNTER — Ambulatory Visit (INDEPENDENT_AMBULATORY_CARE_PROVIDER_SITE_OTHER): Payer: 59

## 2011-12-14 DIAGNOSIS — J309 Allergic rhinitis, unspecified: Secondary | ICD-10-CM

## 2011-12-23 ENCOUNTER — Ambulatory Visit (INDEPENDENT_AMBULATORY_CARE_PROVIDER_SITE_OTHER): Payer: 59

## 2011-12-23 DIAGNOSIS — J309 Allergic rhinitis, unspecified: Secondary | ICD-10-CM

## 2011-12-29 ENCOUNTER — Ambulatory Visit (INDEPENDENT_AMBULATORY_CARE_PROVIDER_SITE_OTHER): Payer: 59

## 2011-12-29 DIAGNOSIS — J309 Allergic rhinitis, unspecified: Secondary | ICD-10-CM

## 2012-01-11 ENCOUNTER — Telehealth: Payer: Self-pay | Admitting: *Deleted

## 2012-01-11 DIAGNOSIS — Z8249 Family history of ischemic heart disease and other diseases of the circulatory system: Secondary | ICD-10-CM

## 2012-01-11 NOTE — Telephone Encounter (Signed)
Advised patient of date and time

## 2012-01-11 NOTE — Telephone Encounter (Signed)
Received a call from patient requesting follow up ultrasound aorta.  Father passed away secondary and it has been a couple of years since his last one.

## 2012-01-14 ENCOUNTER — Telehealth: Payer: Self-pay | Admitting: Cardiology

## 2012-01-14 NOTE — Telephone Encounter (Signed)
N/A.  LMTC. 

## 2012-01-14 NOTE — Telephone Encounter (Signed)
New problem:  Need Z-  pack called in  To [facility] pharmacy.

## 2012-01-15 ENCOUNTER — Other Ambulatory Visit: Payer: Self-pay | Admitting: Nurse Practitioner

## 2012-01-15 MED ORDER — AZITHROMYCIN 250 MG PO TABS: ORAL_TABLET | ORAL | Status: DC

## 2012-01-15 MED ORDER — AZITHROMYCIN 500 MG PO TABS: ORAL_TABLET | ORAL | Status: DC

## 2012-01-15 NOTE — Telephone Encounter (Signed)
Ok per  Dr. Brackbill  

## 2012-01-15 NOTE — Telephone Encounter (Signed)
Will forward to [name]. 

## 2012-01-15 NOTE — Telephone Encounter (Signed)
Rx sent to [facility] as requested, had sent Zithromax 250 mg but he wanted 500 mg

## 2012-01-18 ENCOUNTER — Ambulatory Visit (INDEPENDENT_AMBULATORY_CARE_PROVIDER_SITE_OTHER): Payer: 59

## 2012-01-18 DIAGNOSIS — Z23 Encounter for immunization: Secondary | ICD-10-CM

## 2012-01-18 DIAGNOSIS — J309 Allergic rhinitis, unspecified: Secondary | ICD-10-CM

## 2012-01-25 ENCOUNTER — Telehealth: Payer: Self-pay | Admitting: Cardiology

## 2012-01-25 NOTE — Telephone Encounter (Signed)
New problem:  Would like [name] to call him.

## 2012-01-25 NOTE — Telephone Encounter (Signed)
Routed to me by mistake 

## 2012-01-25 NOTE — Telephone Encounter (Signed)
Patient called wanting to cancel [facility] duplex for now. Will forward to  Dr. Patty Sermons to make him aware

## 2012-01-26 ENCOUNTER — Ambulatory Visit (INDEPENDENT_AMBULATORY_CARE_PROVIDER_SITE_OTHER): Payer: 59

## 2012-01-26 DIAGNOSIS — J309 Allergic rhinitis, unspecified: Secondary | ICD-10-CM

## 2012-01-27 NOTE — Telephone Encounter (Signed)
Okay 

## 2012-02-08 ENCOUNTER — Telehealth: Payer: Self-pay | Admitting: Internal Medicine

## 2012-02-08 ENCOUNTER — Other Ambulatory Visit: Payer: Self-pay | Admitting: Internal Medicine

## 2012-02-08 ENCOUNTER — Ambulatory Visit (INDEPENDENT_AMBULATORY_CARE_PROVIDER_SITE_OTHER): Payer: 59

## 2012-02-08 ENCOUNTER — Encounter: Payer: Self-pay | Admitting: *Deleted

## 2012-02-08 ENCOUNTER — Ambulatory Visit (INDEPENDENT_AMBULATORY_CARE_PROVIDER_SITE_OTHER)
Admission: RE | Admit: 2012-02-08 | Discharge: 2012-02-08 | Disposition: A | Discharge status: Home / Self Care | Payer: 59 | Source: Ambulatory Visit | Attending: Internal Medicine | Admitting: Internal Medicine

## 2012-02-08 DIAGNOSIS — R05 Cough: Secondary | ICD-10-CM

## 2012-02-08 DIAGNOSIS — R059 Cough, unspecified: Secondary | ICD-10-CM

## 2012-02-08 DIAGNOSIS — J309 Allergic rhinitis, unspecified: Secondary | ICD-10-CM

## 2012-02-08 NOTE — Telephone Encounter (Signed)
Notes Recorded by Waymon Budge, MD on 02/08/2012 at 12:09 PM CXR- overinflation and airway thickening suggest bronchitis      Per CDY- pt already aware of results

## 2012-02-08 NOTE — Telephone Encounter (Signed)
I spoke with pt and is aware this has been faxed over to him. Nothing further was needed

## 2012-02-08 NOTE — Telephone Encounter (Signed)
Here at the office today, concerned about dry cough x 3 weeks for which he has already started himself on antibiotic. He asks for CXR. Father died of lung Ca.

## 2012-02-16 ENCOUNTER — Ambulatory Visit (INDEPENDENT_AMBULATORY_CARE_PROVIDER_SITE_OTHER): Payer: 59

## 2012-02-16 DIAGNOSIS — J309 Allergic rhinitis, unspecified: Secondary | ICD-10-CM

## 2012-02-17 ENCOUNTER — Ambulatory Visit (INDEPENDENT_AMBULATORY_CARE_PROVIDER_SITE_OTHER): Payer: 59

## 2012-02-17 DIAGNOSIS — J309 Allergic rhinitis, unspecified: Secondary | ICD-10-CM

## 2012-02-23 ENCOUNTER — Ambulatory Visit (INDEPENDENT_AMBULATORY_CARE_PROVIDER_SITE_OTHER): Payer: 59

## 2012-02-23 DIAGNOSIS — J309 Allergic rhinitis, unspecified: Secondary | ICD-10-CM

## 2012-03-01 ENCOUNTER — Ambulatory Visit (INDEPENDENT_AMBULATORY_CARE_PROVIDER_SITE_OTHER): Payer: 59

## 2012-03-01 DIAGNOSIS — J309 Allergic rhinitis, unspecified: Secondary | ICD-10-CM

## 2012-03-14 ENCOUNTER — Ambulatory Visit (INDEPENDENT_AMBULATORY_CARE_PROVIDER_SITE_OTHER): Payer: 59

## 2012-03-14 DIAGNOSIS — J309 Allergic rhinitis, unspecified: Secondary | ICD-10-CM

## 2012-03-22 ENCOUNTER — Ambulatory Visit (INDEPENDENT_AMBULATORY_CARE_PROVIDER_SITE_OTHER): Payer: 59

## 2012-03-22 DIAGNOSIS — J309 Allergic rhinitis, unspecified: Secondary | ICD-10-CM

## 2012-03-29 ENCOUNTER — Ambulatory Visit (INDEPENDENT_AMBULATORY_CARE_PROVIDER_SITE_OTHER): Payer: 59

## 2012-03-29 DIAGNOSIS — J309 Allergic rhinitis, unspecified: Secondary | ICD-10-CM

## 2012-04-05 ENCOUNTER — Ambulatory Visit (INDEPENDENT_AMBULATORY_CARE_PROVIDER_SITE_OTHER): Payer: 59

## 2012-04-05 DIAGNOSIS — J309 Allergic rhinitis, unspecified: Secondary | ICD-10-CM

## 2012-04-11 ENCOUNTER — Telehealth: Payer: Self-pay | Admitting: *Deleted

## 2012-04-11 ENCOUNTER — Ambulatory Visit (INDEPENDENT_AMBULATORY_CARE_PROVIDER_SITE_OTHER): Payer: 59

## 2012-04-11 DIAGNOSIS — J309 Allergic rhinitis, unspecified: Secondary | ICD-10-CM

## 2012-04-11 MED ORDER — CEFUROXIME AXETIL 250 MG PO TABS: 250.0000 mg | ORAL_TABLET | Freq: Two times a day (BID) | ORAL | Status: DC

## 2012-04-11 NOTE — Telephone Encounter (Signed)
Refilled per patient request. 

## 2012-04-19 ENCOUNTER — Encounter: Payer: Self-pay | Admitting: Internal Medicine

## 2012-04-28 ENCOUNTER — Ambulatory Visit (INDEPENDENT_AMBULATORY_CARE_PROVIDER_SITE_OTHER): Payer: 59

## 2012-04-28 DIAGNOSIS — J309 Allergic rhinitis, unspecified: Secondary | ICD-10-CM

## 2012-05-03 ENCOUNTER — Ambulatory Visit (INDEPENDENT_AMBULATORY_CARE_PROVIDER_SITE_OTHER): Payer: 59

## 2012-05-03 DIAGNOSIS — J309 Allergic rhinitis, unspecified: Secondary | ICD-10-CM

## 2012-05-10 ENCOUNTER — Ambulatory Visit (INDEPENDENT_AMBULATORY_CARE_PROVIDER_SITE_OTHER): Payer: 59

## 2012-05-10 DIAGNOSIS — J309 Allergic rhinitis, unspecified: Secondary | ICD-10-CM

## 2012-05-16 ENCOUNTER — Telehealth: Payer: Self-pay | Admitting: Cardiology

## 2012-05-16 DIAGNOSIS — E785 Hyperlipidemia, unspecified: Secondary | ICD-10-CM

## 2012-05-16 NOTE — Telephone Encounter (Signed)
Pt wants to come in tomorrow am for fasting labs, wants melinda to call him asap to confirm she can put in order

## 2012-05-16 NOTE — Telephone Encounter (Signed)
Scheduled and advised patient

## 2012-05-17 ENCOUNTER — Ambulatory Visit: Payer: 59

## 2012-05-17 ENCOUNTER — Other Ambulatory Visit (INDEPENDENT_AMBULATORY_CARE_PROVIDER_SITE_OTHER): Payer: 59

## 2012-05-17 ENCOUNTER — Ambulatory Visit (INDEPENDENT_AMBULATORY_CARE_PROVIDER_SITE_OTHER): Payer: 59

## 2012-05-17 DIAGNOSIS — E785 Hyperlipidemia, unspecified: Secondary | ICD-10-CM

## 2012-05-17 DIAGNOSIS — J309 Allergic rhinitis, unspecified: Secondary | ICD-10-CM

## 2012-05-17 LAB — HEPATIC FUNCTION PANEL
ALT: 44 U/L (ref 0–53)
AST: 34 U/L (ref 0–37)
Albumin: 4 g/dL (ref 3.5–5.2)
Alkaline Phosphatase: 62 U/L (ref 39–117)
Bilirubin, Direct: 0 mg/dL (ref 0.0–0.3)
Total Bilirubin: 0.8 mg/dL (ref 0.3–1.2)
Total Protein: 7 g/dL (ref 6.0–8.3)

## 2012-05-17 LAB — BASIC METABOLIC PANEL
BUN: 18 mg/dL (ref 6–23)
CO2: 29 mEq/L (ref 19–32)
Calcium: 8.8 mg/dL (ref 8.4–10.5)
Chloride: 102 mEq/L (ref 96–112)
Creatinine, Ser: 1 mg/dL (ref 0.4–1.5)
GFR: 76.28 mL/min (ref >60.00)
Glucose, Bld: 105 mg/dL — ABNORMAL HIGH (ref 70–99)
Potassium: 4.2 mEq/L (ref 3.5–5.1)
Sodium: 138 mEq/L (ref 135–145)

## 2012-05-17 LAB — LIPID PANEL
Cholesterol: 206 mg/dL — ABNORMAL HIGH (ref 0–200)
HDL: 46.8 mg/dL (ref >39.00)
Total CHOL/HDL Ratio: 4
Triglycerides: 68 mg/dL (ref 0.0–149.0)
VLDL: 13.6 mg/dL (ref 0.0–40.0)

## 2012-05-17 LAB — LDL CHOLESTEROL, DIRECT: Direct LDL: 145.5 mg/dL

## 2012-05-17 NOTE — Progress Notes (Signed)
Quick Note:  Please report to patient. The recent labs are stable. Continue same medication and careful diet. Total cholesterol 206 and LDL 145 too high. TGs okay. BS 105 mildly increased. LFTs normal. With High LDL would take the lipitor more regularly. Fax him a copy if he would like. ______

## 2012-05-19 ENCOUNTER — Telehealth: Payer: Self-pay | Admitting: *Deleted

## 2012-05-19 NOTE — Telephone Encounter (Signed)
Dr. Patty Sermons advised patient of labs and sent to him via computer.

## 2012-05-19 NOTE — Telephone Encounter (Signed)
Message copied by Burnell Blanks on Thu May 19, 2012 12:11 PM ------      Message from: Cassell Clement      Created: Tue May 17, 2012  8:04 PM       Please report to patient.  The recent labs are stable. Continue same medication and careful diet. Total cholesterol 206 and LDL 145 too high. TGs okay. BS 105 mildly increased. LFTs normal. With High LDL would take the lipitor more regularly. Fax him a copy if he would like.

## 2012-05-24 ENCOUNTER — Ambulatory Visit: Payer: 59

## 2012-05-24 ENCOUNTER — Ambulatory Visit (INDEPENDENT_AMBULATORY_CARE_PROVIDER_SITE_OTHER): Payer: 59

## 2012-05-24 DIAGNOSIS — J309 Allergic rhinitis, unspecified: Secondary | ICD-10-CM

## 2012-05-31 ENCOUNTER — Ambulatory Visit: Payer: 59

## 2012-06-07 ENCOUNTER — Ambulatory Visit: Payer: 59

## 2012-06-07 ENCOUNTER — Ambulatory Visit (INDEPENDENT_AMBULATORY_CARE_PROVIDER_SITE_OTHER): Payer: 59

## 2012-06-07 DIAGNOSIS — J309 Allergic rhinitis, unspecified: Secondary | ICD-10-CM

## 2012-06-14 ENCOUNTER — Ambulatory Visit: Payer: 59

## 2012-06-14 ENCOUNTER — Ambulatory Visit (INDEPENDENT_AMBULATORY_CARE_PROVIDER_SITE_OTHER): Payer: 59

## 2012-06-14 DIAGNOSIS — J309 Allergic rhinitis, unspecified: Secondary | ICD-10-CM

## 2012-06-21 ENCOUNTER — Ambulatory Visit (INDEPENDENT_AMBULATORY_CARE_PROVIDER_SITE_OTHER): Payer: 59

## 2012-06-21 ENCOUNTER — Ambulatory Visit: Payer: 59

## 2012-06-21 DIAGNOSIS — J309 Allergic rhinitis, unspecified: Secondary | ICD-10-CM

## 2012-06-28 ENCOUNTER — Ambulatory Visit (INDEPENDENT_AMBULATORY_CARE_PROVIDER_SITE_OTHER): Payer: 59

## 2012-06-28 DIAGNOSIS — J309 Allergic rhinitis, unspecified: Secondary | ICD-10-CM

## 2012-06-30 ENCOUNTER — Ambulatory Visit (INDEPENDENT_AMBULATORY_CARE_PROVIDER_SITE_OTHER): Payer: 59

## 2012-06-30 DIAGNOSIS — J309 Allergic rhinitis, unspecified: Secondary | ICD-10-CM

## 2012-07-05 ENCOUNTER — Ambulatory Visit (INDEPENDENT_AMBULATORY_CARE_PROVIDER_SITE_OTHER): Payer: 59

## 2012-07-05 DIAGNOSIS — J309 Allergic rhinitis, unspecified: Secondary | ICD-10-CM

## 2012-07-12 ENCOUNTER — Ambulatory Visit (INDEPENDENT_AMBULATORY_CARE_PROVIDER_SITE_OTHER): Payer: 59

## 2012-07-12 DIAGNOSIS — J309 Allergic rhinitis, unspecified: Secondary | ICD-10-CM

## 2012-07-19 ENCOUNTER — Ambulatory Visit (INDEPENDENT_AMBULATORY_CARE_PROVIDER_SITE_OTHER): Payer: 59

## 2012-07-19 DIAGNOSIS — J309 Allergic rhinitis, unspecified: Secondary | ICD-10-CM

## 2012-07-25 ENCOUNTER — Encounter: Payer: Self-pay | Admitting: Internal Medicine

## 2012-07-26 ENCOUNTER — Ambulatory Visit (INDEPENDENT_AMBULATORY_CARE_PROVIDER_SITE_OTHER): Payer: 59

## 2012-07-26 DIAGNOSIS — J309 Allergic rhinitis, unspecified: Secondary | ICD-10-CM

## 2012-08-02 ENCOUNTER — Ambulatory Visit: Payer: 59

## 2012-08-04 ENCOUNTER — Ambulatory Visit (INDEPENDENT_AMBULATORY_CARE_PROVIDER_SITE_OTHER): Payer: 59

## 2012-08-04 DIAGNOSIS — J309 Allergic rhinitis, unspecified: Secondary | ICD-10-CM

## 2012-08-09 ENCOUNTER — Ambulatory Visit (INDEPENDENT_AMBULATORY_CARE_PROVIDER_SITE_OTHER): Payer: 59

## 2012-08-09 DIAGNOSIS — J309 Allergic rhinitis, unspecified: Secondary | ICD-10-CM

## 2012-08-16 ENCOUNTER — Ambulatory Visit (INDEPENDENT_AMBULATORY_CARE_PROVIDER_SITE_OTHER): Payer: 59

## 2012-08-16 DIAGNOSIS — J309 Allergic rhinitis, unspecified: Secondary | ICD-10-CM

## 2012-08-23 ENCOUNTER — Ambulatory Visit (INDEPENDENT_AMBULATORY_CARE_PROVIDER_SITE_OTHER): Payer: 59

## 2012-08-23 DIAGNOSIS — J309 Allergic rhinitis, unspecified: Secondary | ICD-10-CM

## 2012-08-30 ENCOUNTER — Ambulatory Visit (INDEPENDENT_AMBULATORY_CARE_PROVIDER_SITE_OTHER): Payer: 59

## 2012-08-30 DIAGNOSIS — J309 Allergic rhinitis, unspecified: Secondary | ICD-10-CM

## 2012-08-31 ENCOUNTER — Other Ambulatory Visit: Payer: 59

## 2012-08-31 ENCOUNTER — Other Ambulatory Visit: Payer: Self-pay | Admitting: *Deleted

## 2012-08-31 DIAGNOSIS — R300 Dysuria: Secondary | ICD-10-CM

## 2012-09-05 ENCOUNTER — Other Ambulatory Visit: Payer: Self-pay | Admitting: Cardiovascular Disease

## 2012-09-05 MED ORDER — CHOLECALCIFEROL 1.25 MG (50000 UT) PO TABS: 1.0000 | ORAL_TABLET | ORAL | Status: DC

## 2012-09-05 MED ORDER — AZITHROMYCIN 500 MG PO TABS: ORAL_TABLET | ORAL | Status: DC

## 2012-09-05 NOTE — Progress Notes (Signed)
Pt called for refills.    Refills completed.

## 2012-09-06 ENCOUNTER — Ambulatory Visit (INDEPENDENT_AMBULATORY_CARE_PROVIDER_SITE_OTHER): Payer: 59

## 2012-09-06 DIAGNOSIS — J309 Allergic rhinitis, unspecified: Secondary | ICD-10-CM

## 2012-09-13 ENCOUNTER — Ambulatory Visit (INDEPENDENT_AMBULATORY_CARE_PROVIDER_SITE_OTHER): Payer: 59

## 2012-09-13 DIAGNOSIS — J309 Allergic rhinitis, unspecified: Secondary | ICD-10-CM

## 2012-09-20 ENCOUNTER — Ambulatory Visit: Payer: 59

## 2012-09-27 ENCOUNTER — Ambulatory Visit (INDEPENDENT_AMBULATORY_CARE_PROVIDER_SITE_OTHER): Payer: 59

## 2012-09-27 DIAGNOSIS — J309 Allergic rhinitis, unspecified: Secondary | ICD-10-CM

## 2012-10-04 ENCOUNTER — Ambulatory Visit (INDEPENDENT_AMBULATORY_CARE_PROVIDER_SITE_OTHER): Payer: 59

## 2012-10-04 DIAGNOSIS — J309 Allergic rhinitis, unspecified: Secondary | ICD-10-CM

## 2012-10-11 ENCOUNTER — Ambulatory Visit (INDEPENDENT_AMBULATORY_CARE_PROVIDER_SITE_OTHER): Payer: 59

## 2012-10-11 DIAGNOSIS — J309 Allergic rhinitis, unspecified: Secondary | ICD-10-CM

## 2012-10-18 ENCOUNTER — Ambulatory Visit: Payer: 59

## 2012-10-26 ENCOUNTER — Ambulatory Visit (INDEPENDENT_AMBULATORY_CARE_PROVIDER_SITE_OTHER): Payer: 59

## 2012-10-26 ENCOUNTER — Telehealth: Payer: Self-pay | Admitting: Internal Medicine

## 2012-10-26 DIAGNOSIS — J309 Allergic rhinitis, unspecified: Secondary | ICD-10-CM

## 2012-10-26 MED ORDER — CEFUROXIME AXETIL 250 MG PO TABS: 250.0000 mg | ORAL_TABLET | Freq: Two times a day (BID) | ORAL | Status: DC

## 2012-10-26 NOTE — Telephone Encounter (Signed)
Acute sinusitis- asks Ceftin 250 mg twice daily, # 30, sent to [facility]

## 2012-10-27 ENCOUNTER — Ambulatory Visit (INDEPENDENT_AMBULATORY_CARE_PROVIDER_SITE_OTHER): Payer: 59

## 2012-10-27 DIAGNOSIS — J309 Allergic rhinitis, unspecified: Secondary | ICD-10-CM

## 2012-10-31 ENCOUNTER — Other Ambulatory Visit: Payer: Self-pay | Admitting: Internal Medicine

## 2012-10-31 MED ORDER — MOMETASONE FUROATE 50 MCG/ACT NA SUSP: 2.0000 | Freq: Every day | NASAL | Status: DC

## 2012-10-31 MED ORDER — MONTELUKAST SODIUM 10 MG PO TABS: 10.0000 mg | ORAL_TABLET | Freq: Every day | ORAL | Status: DC

## 2012-10-31 NOTE — Telephone Encounter (Signed)
Per CY - send in singulair and Nasonex Rx to [facility] outpatient pharm

## 2012-11-01 ENCOUNTER — Telehealth: Payer: Self-pay | Admitting: Internal Medicine

## 2012-11-01 ENCOUNTER — Other Ambulatory Visit (INDEPENDENT_AMBULATORY_CARE_PROVIDER_SITE_OTHER): Payer: 59

## 2012-11-01 ENCOUNTER — Ambulatory Visit (INDEPENDENT_AMBULATORY_CARE_PROVIDER_SITE_OTHER): Payer: 59

## 2012-11-01 DIAGNOSIS — J309 Allergic rhinitis, unspecified: Secondary | ICD-10-CM

## 2012-11-01 DIAGNOSIS — J209 Acute bronchitis, unspecified: Secondary | ICD-10-CM

## 2012-11-01 LAB — CBC WITH DIFFERENTIAL/PLATELET
Basophils Absolute: 0 10*3/uL (ref 0.0–0.1)
Basophils Relative: 0.3 % (ref 0.0–3.0)
Eosinophils Absolute: 0.7 10*3/uL (ref 0.0–0.7)
Eosinophils Relative: 11.5 % — ABNORMAL HIGH (ref 0.0–5.0)
HCT: 46 % (ref 39.0–52.0)
Hemoglobin: 15.7 g/dL (ref 13.0–17.0)
Lymphocytes Relative: 22.6 % (ref 12.0–46.0)
Lymphs Abs: 1.5 10*3/uL (ref 0.7–4.0)
MCHC: 34.2 g/dL (ref 30.0–36.0)
MCV: 95 fl (ref 78.0–100.0)
Monocytes Absolute: 0.7 10*3/uL (ref 0.1–1.0)
Monocytes Relative: 10.5 % (ref 3.0–12.0)
Neutro Abs: 3.6 10*3/uL (ref 1.4–7.7)
Neutrophils Relative %: 55.1 % (ref 43.0–77.0)
Platelets: 190 10*3/uL (ref 150.0–400.0)
RBC: 4.85 Mil/uL (ref 4.22–5.81)
RDW: 12.1 % (ref 11.5–14.6)
WBC: 6.5 10*3/uL (ref 4.5–10.5)

## 2012-11-01 NOTE — Telephone Encounter (Signed)
Noted increased sneeze and nasal congestion on bike rides through woods while on beach vacation. Started Singulair.  Plan- lab for allergy profile, CBC

## 2012-11-02 LAB — ALLERGY FULL PROFILE
Allergen, D pternoyssinus,d7: 2.39 kU/L — ABNORMAL HIGH
Allergen,Goose feathers, e70: <0.1 kU/L
Alternaria Alternata: <0.1 kU/L
Aspergillus fumigatus, m3: <0.1 kU/L
Bahia Grass: 0.16 kU/L — ABNORMAL HIGH
Bermuda Grass: 0.15 kU/L — ABNORMAL HIGH
Box Elder IgE: 0.76 kU/L — ABNORMAL HIGH
Candida Albicans: <0.1 kU/L
Cat Dander: <0.1 kU/L
Common Ragweed: 0.61 kU/L — ABNORMAL HIGH
Curvularia lunata: <0.1 kU/L
D. farinae: 2.41 kU/L — ABNORMAL HIGH
Dog Dander: 0.14 kU/L — ABNORMAL HIGH
Elm IgE: 1.39 kU/L — ABNORMAL HIGH
Fescue: 0.16 kU/L — ABNORMAL HIGH
G005 Rye, Perennial: 0.15 kU/L — ABNORMAL HIGH
G009 Red Top: 0.14 kU/L — ABNORMAL HIGH
Goldenrod: 0.23 kU/L — ABNORMAL HIGH
Helminthosporium halodes: <0.1 kU/L
House Dust Hollister: 0.15 kU/L — ABNORMAL HIGH
IgE (Immunoglobulin E), Serum: 39.1 IU/mL (ref 0.0–180.0)
Lamb's Quarters: 0.21 kU/L — ABNORMAL HIGH
Oak: 2.06 kU/L — ABNORMAL HIGH
Plantain: 0.29 kU/L — ABNORMAL HIGH
Stemphylium Botryosum: <0.1 kU/L
Sycamore Tree: 0.23 kU/L — ABNORMAL HIGH
Timothy Grass: 0.14 kU/L — ABNORMAL HIGH

## 2012-11-08 ENCOUNTER — Ambulatory Visit: Payer: 59

## 2012-11-15 ENCOUNTER — Ambulatory Visit (INDEPENDENT_AMBULATORY_CARE_PROVIDER_SITE_OTHER): Payer: 59

## 2012-11-15 ENCOUNTER — Other Ambulatory Visit: Payer: Self-pay | Admitting: Internal Medicine

## 2012-11-15 DIAGNOSIS — J309 Allergic rhinitis, unspecified: Secondary | ICD-10-CM

## 2012-11-15 MED ORDER — MONTELUKAST SODIUM 10 MG PO TABS: 10.0000 mg | ORAL_TABLET | Freq: Every day | ORAL | Status: DC

## 2012-11-22 ENCOUNTER — Ambulatory Visit (INDEPENDENT_AMBULATORY_CARE_PROVIDER_SITE_OTHER): Payer: 59

## 2012-11-22 DIAGNOSIS — J309 Allergic rhinitis, unspecified: Secondary | ICD-10-CM

## 2012-11-29 ENCOUNTER — Ambulatory Visit: Payer: 59

## 2012-12-06 ENCOUNTER — Ambulatory Visit (INDEPENDENT_AMBULATORY_CARE_PROVIDER_SITE_OTHER): Payer: 59

## 2012-12-06 DIAGNOSIS — I2789 Other specified pulmonary heart diseases: Secondary | ICD-10-CM

## 2012-12-12 ENCOUNTER — Ambulatory Visit (INDEPENDENT_AMBULATORY_CARE_PROVIDER_SITE_OTHER): Payer: 59

## 2012-12-12 DIAGNOSIS — J309 Allergic rhinitis, unspecified: Secondary | ICD-10-CM

## 2012-12-20 ENCOUNTER — Ambulatory Visit: Payer: 59

## 2012-12-21 ENCOUNTER — Ambulatory Visit (INDEPENDENT_AMBULATORY_CARE_PROVIDER_SITE_OTHER): Payer: 59

## 2012-12-21 DIAGNOSIS — J309 Allergic rhinitis, unspecified: Secondary | ICD-10-CM

## 2012-12-27 ENCOUNTER — Ambulatory Visit (INDEPENDENT_AMBULATORY_CARE_PROVIDER_SITE_OTHER): Payer: 59

## 2012-12-27 DIAGNOSIS — J309 Allergic rhinitis, unspecified: Secondary | ICD-10-CM

## 2013-01-03 ENCOUNTER — Ambulatory Visit (INDEPENDENT_AMBULATORY_CARE_PROVIDER_SITE_OTHER): Payer: 59

## 2013-01-03 DIAGNOSIS — J309 Allergic rhinitis, unspecified: Secondary | ICD-10-CM

## 2013-01-10 ENCOUNTER — Ambulatory Visit (INDEPENDENT_AMBULATORY_CARE_PROVIDER_SITE_OTHER): Payer: 59

## 2013-01-10 DIAGNOSIS — J309 Allergic rhinitis, unspecified: Secondary | ICD-10-CM

## 2013-01-17 ENCOUNTER — Ambulatory Visit (INDEPENDENT_AMBULATORY_CARE_PROVIDER_SITE_OTHER): Payer: 59

## 2013-01-17 DIAGNOSIS — J309 Allergic rhinitis, unspecified: Secondary | ICD-10-CM

## 2013-01-18 ENCOUNTER — Ambulatory Visit (INDEPENDENT_AMBULATORY_CARE_PROVIDER_SITE_OTHER): Payer: 59 | Admitting: Internal Medicine

## 2013-01-18 ENCOUNTER — Encounter: Payer: Self-pay | Admitting: Internal Medicine

## 2013-01-18 ENCOUNTER — Encounter: Payer: Self-pay | Admitting: *Deleted

## 2013-01-18 VITALS — BP 118/72 | HR 66 | Ht 75.0 in | Wt 222.0 lb

## 2013-01-18 DIAGNOSIS — J302 Other seasonal allergic rhinitis: Secondary | ICD-10-CM

## 2013-01-18 DIAGNOSIS — Z23 Encounter for immunization: Secondary | ICD-10-CM

## 2013-01-18 DIAGNOSIS — J309 Allergic rhinitis, unspecified: Secondary | ICD-10-CM

## 2013-01-18 MED ORDER — MONTELUKAST SODIUM 10 MG PO TABS: 10.0000 mg | ORAL_TABLET | Freq: Every day | ORAL | Status: DC

## 2013-01-18 MED ORDER — PREDNISONE 5 MG PO TABS: ORAL_TABLET | ORAL | Status: DC

## 2013-01-18 NOTE — Patient Instructions (Addendum)
Script sent for Singulair refill - [facility]  Flu vax  We will remix and restart your vaccine based on current testing  Script for dospak to [facility]

## 2013-01-18 NOTE — Assessment & Plan Note (Signed)
Discussed options. Will restart and rebuild vaccine as discussed. Given refill singulair, prednisone taper to use if needed. Flu vax.

## 2013-01-18 NOTE — Progress Notes (Signed)
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 Nasal stuffiness sniffing. Denies wheeze or cough. Using nasal steroid sprays but tends to get some mild epistaxis. Increased rhinitis and chest congestion this summer. Using Singulair, Nasonex, recent sample Dymista Hx sinus surgery / Dr Ezzard Standing- helped. Coming now to update - no antihistamines, OTC cough syrups, or OTC sleep aids in past 3 days Allergy skin test- 01/18/13- Note changes from previous testing with significant positives dog ( has dog), grasses in addition to other common inhalant allergens  Prior to Admission medications   Medication Sig Start Date End Date Taking? Authorizing Provider  atorvastatin (LIPITOR) 20 MG tablet Take 20 mg by mouth daily. Usually take 10 mg couple times a week 05/01/11 01/18/13 Yes Cassell Clement, MD  Cholecalciferol 50000 UNITS TABS Take 1 tablet by mouth once a week. 09/05/12  Yes Vesta Mixer, MD  mometasone (NASONEX) 50 MCG/ACT nasal spray Place 2 sprays into the nose daily. 10/31/12  Yes Waymon Budge, MD  montelukast (SINGULAIR) 10 MG tablet Take 1 tablet (10 mg total) by mouth at bedtime. 01/18/13  Yes Waymon Budge, MD  predniSONE (DELTASONE) 5 MG tablet 4 X 2 DAYS, 3 X 2 DAYS, 2 X 2 DAYS, 1 X 2 DAYS 01/18/13 01/18/14  Waymon Budge, MD   Past Medical History  Diagnosis Date  . Allergic rhinitis   . Hyperlipidemia    Past Surgical History  Procedure Laterality Date  . Nasal sinus surgery    . Hand surgery  age 44    for infection  . Abdominal aorta ultrasound  07/22/2009    mild atherosclerotic changes without aneurysm  . Nm myoview ltd  03/19/2009    normal/ EF- 68%   Family History  Problem Relation Age of Onset  . Lung cancer Mother   . Aortic aneurysm Father   . Allergic rhinitis      GM   History   Social History  . Marital Status:  Married    Spouse Name: N/A    Number of Children: N/A  . Years of Education: N/A   Occupational History  . ENT surgeon    Social History Main Topics  . Smoking status: Former Smoker    Types: Pipe    Quit date: 07/08/2011  . Smokeless tobacco: Not on file  . Alcohol Use: Not on file  . Drug Use: No  . Sexual Activity: Not on file   Other Topics Concern  . Not on file   Social History Narrative  . No narrative on file   ROS-see HPI Constitutional:   No-   weight loss, night sweats, fevers, chills, fatigue, lassitude. HEENT:   No-  headaches, difficulty swallowing, tooth/dental problems, sore throat,       No-  +sneezing, itching, ear ache, +nasal congestion, +ost nasal drip,  CV:  No-   chest pain, orthopnea, PND, swelling in lower extremities, anasarca, dizziness, palpitations Resp: No-   shortness of breath with exertion or at rest.              No-   productive cough,  No non-productive cough,  No- coughing up of blood.              No-   change in color of mucus.  No- wheezing.   Skin: No-   rash or lesions. GI:  No-   heartburn, indigestion, abdominal pain, nausea, vomiting,  GU:  MS:  No-   joint pain or swelling.   Neuro-     nothing unusual Psych:  No- change in mood or affect. No depression or anxiety.  No memory loss.  OBJ- Physical Exam General- Alert, Oriented, Affect-appropriate, Distress- none acute Skin- rash-none, lesions- none, excoriation- none Lymphadenopathy- none Head- atraumatic            Eyes- Gross vision intact, PERRLA, conjunctivae and secretions clear            Ears- Hearing, canals-normal            Nose- +Pale mucosa, watery mucus, no-Septal dev, polyps, erosion, perforation             Throat- Mallampati II , mucosa clear , drainage- none, tonsils- atrophic Neck- flexible , trachea midline, no stridor , thyroid nl, carotid no bruit Chest - symmetrical excursion , unlabored           Heart/CV- RRR , no murmur , no gallop  , no rub, nl s1  s2                           - JVD- none , edema- none, stasis changes- none, varices- none           Lung- clear to P&A, wheeze- none, cough- none , dullness-none, rub- none           Chest wall-  Abd-  Br/ Gen/ Rectal- Not done, not indicated Extrem- cyanosis- none, clubbing, none, atrophy- none, strength- nl Neuro- grossly intact to observation

## 2013-01-23 ENCOUNTER — Ambulatory Visit (INDEPENDENT_AMBULATORY_CARE_PROVIDER_SITE_OTHER): Payer: 59

## 2013-01-23 DIAGNOSIS — J309 Allergic rhinitis, unspecified: Secondary | ICD-10-CM

## 2013-01-24 ENCOUNTER — Ambulatory Visit (INDEPENDENT_AMBULATORY_CARE_PROVIDER_SITE_OTHER): Payer: 59

## 2013-01-24 DIAGNOSIS — J309 Allergic rhinitis, unspecified: Secondary | ICD-10-CM

## 2013-01-27 ENCOUNTER — Ambulatory Visit (INDEPENDENT_AMBULATORY_CARE_PROVIDER_SITE_OTHER): Payer: 59

## 2013-01-27 DIAGNOSIS — J309 Allergic rhinitis, unspecified: Secondary | ICD-10-CM

## 2013-01-31 ENCOUNTER — Ambulatory Visit (INDEPENDENT_AMBULATORY_CARE_PROVIDER_SITE_OTHER): Payer: 59

## 2013-01-31 DIAGNOSIS — J309 Allergic rhinitis, unspecified: Secondary | ICD-10-CM

## 2013-02-03 ENCOUNTER — Ambulatory Visit: Payer: 59

## 2013-02-14 ENCOUNTER — Ambulatory Visit (INDEPENDENT_AMBULATORY_CARE_PROVIDER_SITE_OTHER): Payer: 59

## 2013-02-14 DIAGNOSIS — J309 Allergic rhinitis, unspecified: Secondary | ICD-10-CM

## 2013-02-17 ENCOUNTER — Ambulatory Visit (INDEPENDENT_AMBULATORY_CARE_PROVIDER_SITE_OTHER): Payer: 59

## 2013-02-17 DIAGNOSIS — J309 Allergic rhinitis, unspecified: Secondary | ICD-10-CM

## 2013-02-21 ENCOUNTER — Ambulatory Visit (INDEPENDENT_AMBULATORY_CARE_PROVIDER_SITE_OTHER): Payer: 59

## 2013-02-21 DIAGNOSIS — J309 Allergic rhinitis, unspecified: Secondary | ICD-10-CM

## 2013-02-24 ENCOUNTER — Ambulatory Visit (INDEPENDENT_AMBULATORY_CARE_PROVIDER_SITE_OTHER): Payer: 59

## 2013-02-24 DIAGNOSIS — J309 Allergic rhinitis, unspecified: Secondary | ICD-10-CM

## 2013-02-27 ENCOUNTER — Ambulatory Visit (INDEPENDENT_AMBULATORY_CARE_PROVIDER_SITE_OTHER): Payer: 59

## 2013-02-27 DIAGNOSIS — J309 Allergic rhinitis, unspecified: Secondary | ICD-10-CM

## 2013-02-28 ENCOUNTER — Ambulatory Visit: Payer: 59

## 2013-03-01 ENCOUNTER — Ambulatory Visit (INDEPENDENT_AMBULATORY_CARE_PROVIDER_SITE_OTHER): Payer: 59

## 2013-03-01 DIAGNOSIS — J309 Allergic rhinitis, unspecified: Secondary | ICD-10-CM

## 2013-03-02 ENCOUNTER — Ambulatory Visit (INDEPENDENT_AMBULATORY_CARE_PROVIDER_SITE_OTHER): Payer: 59

## 2013-03-02 DIAGNOSIS — J309 Allergic rhinitis, unspecified: Secondary | ICD-10-CM

## 2013-03-06 ENCOUNTER — Ambulatory Visit (INDEPENDENT_AMBULATORY_CARE_PROVIDER_SITE_OTHER): Payer: 59

## 2013-03-06 DIAGNOSIS — J309 Allergic rhinitis, unspecified: Secondary | ICD-10-CM

## 2013-03-09 ENCOUNTER — Ambulatory Visit (INDEPENDENT_AMBULATORY_CARE_PROVIDER_SITE_OTHER): Payer: 59

## 2013-03-09 DIAGNOSIS — J309 Allergic rhinitis, unspecified: Secondary | ICD-10-CM

## 2013-03-14 ENCOUNTER — Ambulatory Visit (INDEPENDENT_AMBULATORY_CARE_PROVIDER_SITE_OTHER): Payer: 59

## 2013-03-14 DIAGNOSIS — J309 Allergic rhinitis, unspecified: Secondary | ICD-10-CM

## 2013-03-17 ENCOUNTER — Ambulatory Visit (INDEPENDENT_AMBULATORY_CARE_PROVIDER_SITE_OTHER): Payer: 59

## 2013-03-17 DIAGNOSIS — J309 Allergic rhinitis, unspecified: Secondary | ICD-10-CM

## 2013-03-21 ENCOUNTER — Ambulatory Visit (INDEPENDENT_AMBULATORY_CARE_PROVIDER_SITE_OTHER): Payer: 59

## 2013-03-21 DIAGNOSIS — J309 Allergic rhinitis, unspecified: Secondary | ICD-10-CM

## 2013-03-23 ENCOUNTER — Ambulatory Visit (INDEPENDENT_AMBULATORY_CARE_PROVIDER_SITE_OTHER): Payer: 59

## 2013-03-23 DIAGNOSIS — J309 Allergic rhinitis, unspecified: Secondary | ICD-10-CM

## 2013-03-28 ENCOUNTER — Ambulatory Visit (INDEPENDENT_AMBULATORY_CARE_PROVIDER_SITE_OTHER): Payer: 59

## 2013-03-28 DIAGNOSIS — J309 Allergic rhinitis, unspecified: Secondary | ICD-10-CM

## 2013-03-29 ENCOUNTER — Ambulatory Visit (INDEPENDENT_AMBULATORY_CARE_PROVIDER_SITE_OTHER): Payer: 59

## 2013-03-29 DIAGNOSIS — J309 Allergic rhinitis, unspecified: Secondary | ICD-10-CM

## 2013-03-30 ENCOUNTER — Ambulatory Visit (INDEPENDENT_AMBULATORY_CARE_PROVIDER_SITE_OTHER): Payer: 59

## 2013-03-30 ENCOUNTER — Encounter: Payer: Self-pay | Admitting: Internal Medicine

## 2013-03-30 DIAGNOSIS — J309 Allergic rhinitis, unspecified: Secondary | ICD-10-CM

## 2013-04-04 ENCOUNTER — Ambulatory Visit (INDEPENDENT_AMBULATORY_CARE_PROVIDER_SITE_OTHER): Payer: 59

## 2013-04-04 DIAGNOSIS — J309 Allergic rhinitis, unspecified: Secondary | ICD-10-CM

## 2013-04-06 ENCOUNTER — Ambulatory Visit (INDEPENDENT_AMBULATORY_CARE_PROVIDER_SITE_OTHER): Payer: 59

## 2013-04-06 DIAGNOSIS — J309 Allergic rhinitis, unspecified: Secondary | ICD-10-CM

## 2013-04-07 ENCOUNTER — Ambulatory Visit: Payer: 59

## 2013-04-11 ENCOUNTER — Ambulatory Visit (INDEPENDENT_AMBULATORY_CARE_PROVIDER_SITE_OTHER): Payer: 59

## 2013-04-11 DIAGNOSIS — J309 Allergic rhinitis, unspecified: Secondary | ICD-10-CM

## 2013-04-14 ENCOUNTER — Ambulatory Visit (INDEPENDENT_AMBULATORY_CARE_PROVIDER_SITE_OTHER): Payer: 59

## 2013-04-14 ENCOUNTER — Telehealth: Payer: Self-pay | Admitting: Internal Medicine

## 2013-04-14 DIAGNOSIS — J309 Allergic rhinitis, unspecified: Secondary | ICD-10-CM

## 2013-04-14 MED ORDER — CEFUROXIME AXETIL 500 MG PO TABS: 500.0000 mg | ORAL_TABLET | Freq: Two times a day (BID) | ORAL | Status: DC

## 2013-04-14 NOTE — Telephone Encounter (Signed)
Spoke with pt and he stated that he caught a bug from one of his pts.  Would like ceftin bid  #30 called to his pharmacy.  Spoke with CY---he is ok for the ceftin 500 mg  #30  1 po bid to be sent in to the pharmacy for the pt.  Pt is aware.

## 2013-04-28 ENCOUNTER — Ambulatory Visit (INDEPENDENT_AMBULATORY_CARE_PROVIDER_SITE_OTHER): Payer: 59

## 2013-04-28 DIAGNOSIS — J309 Allergic rhinitis, unspecified: Secondary | ICD-10-CM

## 2013-05-01 ENCOUNTER — Ambulatory Visit (INDEPENDENT_AMBULATORY_CARE_PROVIDER_SITE_OTHER): Payer: 59

## 2013-05-01 DIAGNOSIS — J309 Allergic rhinitis, unspecified: Secondary | ICD-10-CM

## 2013-05-04 ENCOUNTER — Ambulatory Visit (INDEPENDENT_AMBULATORY_CARE_PROVIDER_SITE_OTHER): Payer: 59

## 2013-05-04 DIAGNOSIS — J309 Allergic rhinitis, unspecified: Secondary | ICD-10-CM

## 2013-05-08 ENCOUNTER — Ambulatory Visit: Payer: 59

## 2013-05-09 ENCOUNTER — Ambulatory Visit (INDEPENDENT_AMBULATORY_CARE_PROVIDER_SITE_OTHER): Payer: 59

## 2013-05-09 DIAGNOSIS — J309 Allergic rhinitis, unspecified: Secondary | ICD-10-CM

## 2013-05-10 ENCOUNTER — Ambulatory Visit (INDEPENDENT_AMBULATORY_CARE_PROVIDER_SITE_OTHER): Payer: 59

## 2013-05-10 DIAGNOSIS — J309 Allergic rhinitis, unspecified: Secondary | ICD-10-CM

## 2013-05-12 ENCOUNTER — Ambulatory Visit (INDEPENDENT_AMBULATORY_CARE_PROVIDER_SITE_OTHER): Payer: 59

## 2013-05-12 DIAGNOSIS — J309 Allergic rhinitis, unspecified: Secondary | ICD-10-CM

## 2013-05-15 ENCOUNTER — Ambulatory Visit (INDEPENDENT_AMBULATORY_CARE_PROVIDER_SITE_OTHER): Payer: 59

## 2013-05-15 DIAGNOSIS — J309 Allergic rhinitis, unspecified: Secondary | ICD-10-CM

## 2013-05-16 ENCOUNTER — Ambulatory Visit: Payer: 59

## 2013-05-17 ENCOUNTER — Encounter: Payer: Self-pay | Admitting: Internal Medicine

## 2013-05-18 ENCOUNTER — Ambulatory Visit (INDEPENDENT_AMBULATORY_CARE_PROVIDER_SITE_OTHER): Payer: 59

## 2013-05-18 DIAGNOSIS — J309 Allergic rhinitis, unspecified: Secondary | ICD-10-CM

## 2013-05-22 ENCOUNTER — Ambulatory Visit (INDEPENDENT_AMBULATORY_CARE_PROVIDER_SITE_OTHER): Payer: 59

## 2013-05-22 DIAGNOSIS — J309 Allergic rhinitis, unspecified: Secondary | ICD-10-CM

## 2013-05-23 ENCOUNTER — Ambulatory Visit: Payer: 59

## 2013-05-25 ENCOUNTER — Ambulatory Visit (INDEPENDENT_AMBULATORY_CARE_PROVIDER_SITE_OTHER): Payer: 59

## 2013-05-25 DIAGNOSIS — J309 Allergic rhinitis, unspecified: Secondary | ICD-10-CM

## 2013-05-31 ENCOUNTER — Ambulatory Visit (INDEPENDENT_AMBULATORY_CARE_PROVIDER_SITE_OTHER): Payer: 59

## 2013-05-31 DIAGNOSIS — J309 Allergic rhinitis, unspecified: Secondary | ICD-10-CM

## 2013-06-01 ENCOUNTER — Ambulatory Visit: Payer: 59

## 2013-06-02 ENCOUNTER — Ambulatory Visit: Payer: 59

## 2013-06-12 ENCOUNTER — Ambulatory Visit (INDEPENDENT_AMBULATORY_CARE_PROVIDER_SITE_OTHER): Payer: 59

## 2013-06-12 DIAGNOSIS — J309 Allergic rhinitis, unspecified: Secondary | ICD-10-CM

## 2013-06-22 ENCOUNTER — Ambulatory Visit (INDEPENDENT_AMBULATORY_CARE_PROVIDER_SITE_OTHER): Payer: 59

## 2013-06-22 DIAGNOSIS — J309 Allergic rhinitis, unspecified: Secondary | ICD-10-CM

## 2013-06-26 ENCOUNTER — Ambulatory Visit (INDEPENDENT_AMBULATORY_CARE_PROVIDER_SITE_OTHER): Payer: 59

## 2013-06-26 DIAGNOSIS — J309 Allergic rhinitis, unspecified: Secondary | ICD-10-CM

## 2013-06-27 ENCOUNTER — Ambulatory Visit: Payer: 59

## 2013-06-29 ENCOUNTER — Ambulatory Visit: Payer: 59

## 2013-07-11 ENCOUNTER — Ambulatory Visit (INDEPENDENT_AMBULATORY_CARE_PROVIDER_SITE_OTHER): Payer: 59

## 2013-07-11 DIAGNOSIS — J309 Allergic rhinitis, unspecified: Secondary | ICD-10-CM

## 2013-07-17 ENCOUNTER — Ambulatory Visit (INDEPENDENT_AMBULATORY_CARE_PROVIDER_SITE_OTHER): Payer: 59

## 2013-07-17 DIAGNOSIS — J309 Allergic rhinitis, unspecified: Secondary | ICD-10-CM

## 2013-07-18 ENCOUNTER — Ambulatory Visit: Payer: 59

## 2013-07-24 ENCOUNTER — Ambulatory Visit (INDEPENDENT_AMBULATORY_CARE_PROVIDER_SITE_OTHER): Payer: 59

## 2013-07-24 ENCOUNTER — Telehealth: Payer: Self-pay | Admitting: Internal Medicine

## 2013-07-24 DIAGNOSIS — J309 Allergic rhinitis, unspecified: Secondary | ICD-10-CM

## 2013-07-25 NOTE — Telephone Encounter (Signed)
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 He wanted me to ask you if going to a stronger strength would help? He and his family left for the beach yesterday. He'll be back next week. Please advise.

## 2013-07-25 NOTE — Telephone Encounter (Signed)
Ok to advance allergy vaccine to 1:10 per protocol

## 2013-07-25 NOTE — Telephone Encounter (Signed)
I'll just hold this in my box. Pt. Is out of town this wk.. Unless he calls me before hand I'm going to call him 07/31/13 to see if he wants to start the 1:10 now or if he wants to use up his current supply. He has 4 to 6 shots left.

## 2013-07-31 ENCOUNTER — Ambulatory Visit (INDEPENDENT_AMBULATORY_CARE_PROVIDER_SITE_OTHER): Payer: 59

## 2013-07-31 DIAGNOSIS — J309 Allergic rhinitis, unspecified: Secondary | ICD-10-CM

## 2013-07-31 NOTE — Telephone Encounter (Signed)
I explained to Dr. Ernesto [name] he had 4 to 6 wks. Of 1:50 left. He decided to go ahead and start the 1:10(that you approved) next wk.. He doesn't want to be in the middle of allergy season still suffering like he is now. He's hoping between the rain and increasing his vac. Strength he will fare better.

## 2013-08-01 ENCOUNTER — Ambulatory Visit: Payer: 59

## 2013-08-01 ENCOUNTER — Ambulatory Visit (INDEPENDENT_AMBULATORY_CARE_PROVIDER_SITE_OTHER): Payer: 59

## 2013-08-01 DIAGNOSIS — J309 Allergic rhinitis, unspecified: Secondary | ICD-10-CM

## 2013-08-03 NOTE — Telephone Encounter (Signed)
Noted  

## 2013-08-03 NOTE — Telephone Encounter (Signed)
I made pt's 1:10 vac. 08/01/13 since he got his 1:50 at 0.5 07/31/13.( We can't charge pt. For both on the same day, ins. Won't pay.) I will start him at 0.1 of 1"10 next wk. When he comes in.( and build as tolerated.)

## 2013-08-07 ENCOUNTER — Ambulatory Visit (INDEPENDENT_AMBULATORY_CARE_PROVIDER_SITE_OTHER): Payer: 59

## 2013-08-07 DIAGNOSIS — J309 Allergic rhinitis, unspecified: Secondary | ICD-10-CM

## 2013-08-14 ENCOUNTER — Ambulatory Visit: Payer: 59

## 2013-08-15 ENCOUNTER — Ambulatory Visit (INDEPENDENT_AMBULATORY_CARE_PROVIDER_SITE_OTHER): Payer: 59

## 2013-08-15 DIAGNOSIS — J309 Allergic rhinitis, unspecified: Secondary | ICD-10-CM

## 2013-08-21 ENCOUNTER — Ambulatory Visit (INDEPENDENT_AMBULATORY_CARE_PROVIDER_SITE_OTHER): Payer: 59

## 2013-08-21 DIAGNOSIS — J309 Allergic rhinitis, unspecified: Secondary | ICD-10-CM

## 2013-08-22 ENCOUNTER — Ambulatory Visit: Payer: 59

## 2013-08-28 ENCOUNTER — Ambulatory Visit: Payer: 59

## 2013-08-29 ENCOUNTER — Ambulatory Visit (INDEPENDENT_AMBULATORY_CARE_PROVIDER_SITE_OTHER): Payer: 59

## 2013-08-29 DIAGNOSIS — J309 Allergic rhinitis, unspecified: Secondary | ICD-10-CM

## 2013-09-04 ENCOUNTER — Ambulatory Visit (INDEPENDENT_AMBULATORY_CARE_PROVIDER_SITE_OTHER): Payer: 59

## 2013-09-04 DIAGNOSIS — J309 Allergic rhinitis, unspecified: Secondary | ICD-10-CM

## 2013-09-05 ENCOUNTER — Ambulatory Visit: Payer: 59

## 2013-09-13 ENCOUNTER — Ambulatory Visit (INDEPENDENT_AMBULATORY_CARE_PROVIDER_SITE_OTHER): Payer: 59

## 2013-09-13 DIAGNOSIS — J309 Allergic rhinitis, unspecified: Secondary | ICD-10-CM

## 2013-09-19 ENCOUNTER — Ambulatory Visit (INDEPENDENT_AMBULATORY_CARE_PROVIDER_SITE_OTHER): Payer: 59

## 2013-09-19 ENCOUNTER — Encounter: Payer: Self-pay | Admitting: Internal Medicine

## 2013-09-19 ENCOUNTER — Ambulatory Visit: Payer: 59

## 2013-09-19 DIAGNOSIS — J309 Allergic rhinitis, unspecified: Secondary | ICD-10-CM

## 2013-09-26 ENCOUNTER — Ambulatory Visit (INDEPENDENT_AMBULATORY_CARE_PROVIDER_SITE_OTHER): Payer: 59

## 2013-09-26 DIAGNOSIS — J309 Allergic rhinitis, unspecified: Secondary | ICD-10-CM

## 2013-10-03 ENCOUNTER — Ambulatory Visit (INDEPENDENT_AMBULATORY_CARE_PROVIDER_SITE_OTHER): Payer: 59

## 2013-10-03 DIAGNOSIS — J309 Allergic rhinitis, unspecified: Secondary | ICD-10-CM

## 2013-10-10 ENCOUNTER — Ambulatory Visit (INDEPENDENT_AMBULATORY_CARE_PROVIDER_SITE_OTHER): Payer: 59

## 2013-10-10 DIAGNOSIS — J309 Allergic rhinitis, unspecified: Secondary | ICD-10-CM

## 2013-10-25 ENCOUNTER — Ambulatory Visit (INDEPENDENT_AMBULATORY_CARE_PROVIDER_SITE_OTHER): Payer: 59

## 2013-10-25 DIAGNOSIS — J309 Allergic rhinitis, unspecified: Secondary | ICD-10-CM

## 2013-10-31 ENCOUNTER — Ambulatory Visit (INDEPENDENT_AMBULATORY_CARE_PROVIDER_SITE_OTHER): Payer: 59

## 2013-10-31 DIAGNOSIS — J309 Allergic rhinitis, unspecified: Secondary | ICD-10-CM

## 2013-11-02 ENCOUNTER — Telehealth: Payer: Self-pay | Admitting: Internal Medicine

## 2013-11-02 MED ORDER — PREDNISONE 5 MG PO TABS: ORAL_TABLET | ORAL | Status: AC

## 2013-11-02 NOTE — Telephone Encounter (Signed)
3 weeks ad itching inside eyelids. Saw Mikki Santee Groat/ Opth- dx allergic conjunctivitis, Rx'd Allerx drops Onset after cleaning pollen off screens in [place], persisted at [employer] steroid dospaks in May and June. Symptoms included rhinitis earlier in Spring, and return as comes off steroids. Plan- Prednisone 10 mg every other day. Continue eyedrops.Rx sent to [facility]

## 2013-11-14 ENCOUNTER — Ambulatory Visit (INDEPENDENT_AMBULATORY_CARE_PROVIDER_SITE_OTHER): Payer: 59

## 2013-11-14 DIAGNOSIS — J309 Allergic rhinitis, unspecified: Secondary | ICD-10-CM

## 2013-11-17 ENCOUNTER — Other Ambulatory Visit (INDEPENDENT_AMBULATORY_CARE_PROVIDER_SITE_OTHER): Payer: 59

## 2013-11-17 ENCOUNTER — Telehealth: Payer: Self-pay | Admitting: Internal Medicine

## 2013-11-17 DIAGNOSIS — H1045 Other chronic allergic conjunctivitis: Secondary | ICD-10-CM

## 2013-11-17 DIAGNOSIS — H1013 Acute atopic conjunctivitis, bilateral: Secondary | ICD-10-CM

## 2013-11-17 LAB — CBC WITH DIFFERENTIAL/PLATELET
Basophils Absolute: 0 10*3/uL (ref 0.0–0.1)
Basophils Relative: 0.3 % (ref 0.0–3.0)
Eosinophils Absolute: 0.3 10*3/uL (ref 0.0–0.7)
Eosinophils Relative: 6.3 % — ABNORMAL HIGH (ref 0.0–5.0)
HCT: 44.3 % (ref 39.0–52.0)
Hemoglobin: 14.8 g/dL (ref 13.0–17.0)
Lymphocytes Relative: 25.4 % (ref 12.0–46.0)
Lymphs Abs: 1.3 10*3/uL (ref 0.7–4.0)
MCHC: 33.5 g/dL (ref 30.0–36.0)
MCV: 95 fl (ref 78.0–100.0)
Monocytes Absolute: 0.4 10*3/uL (ref 0.1–1.0)
Monocytes Relative: 7.7 % (ref 3.0–12.0)
Neutro Abs: 3.1 10*3/uL (ref 1.4–7.7)
Neutrophils Relative %: 60.3 % (ref 43.0–77.0)
Platelets: 197 10*3/uL (ref 150.0–400.0)
RBC: 4.67 Mil/uL (ref 4.22–5.81)
RDW: 13 % (ref 11.5–15.5)
WBC: 5.2 10*3/uL (ref 4.0–10.5)

## 2013-11-17 LAB — SEDIMENTATION RATE: Sed Rate: 7 mm/hr (ref 0–22)

## 2013-11-17 NOTE — Telephone Encounter (Signed)
Spoke with Dr Harry [name] (patient)-- C/o increased allergy symptoms with bad eye itching. States that it has worsened this month and he cannot seem to get any relief.  Pt states that he has tried steroid eyes eyes drops along with a prednisone dose pak Suspects this is maybe a mold allergy? States that he noticed it was bad in April and also the latter part of June.   Requesting some recommendations of anything further that could be offered to give relief. Pt also requesting to have a CBC w/diff drawn today to check his Eosinophil levels as they have been increased in the past.  Please advise Dr Annamaria Boots as Dr Harry [name] is requesting a callback soon.  Can be reached on mobile number.  No Known Allergies  Current Outpatient Prescriptions on File Prior to Visit  Medication Sig Dispense Refill  . atorvastatin (LIPITOR) 20 MG tablet Take 20 mg by mouth daily. Usually take 10 mg couple times a week      . cefUROXime (CEFTIN) 500 MG tablet Take 1 tablet (500 mg total) by mouth 2 (two) times daily with a meal.  30 tablet  0  . Cholecalciferol 50000 UNITS TABS Take 1 tablet by mouth once a week.  6 tablet  3  . mometasone (NASONEX) 50 MCG/ACT nasal spray Place 2 sprays into the nose daily.  17 g  2  . montelukast (SINGULAIR) 10 MG tablet Take 1 tablet (10 mg total) by mouth at bedtime.  90 tablet  3  . predniSONE (DELTASONE) 5 MG tablet 2 tabs (10 mg) every other day while needed  50 tablet  1   No current facility-administered medications on file prior to visit.

## 2013-11-17 NOTE — Telephone Encounter (Signed)
Per CDY: OK to order CBC w/diff, Sed Rate, ANA, Rheum Factor and ANCA quant. Dx allergic conjunctivitis   For the itchy eyes : try a soothing OTC ointment like Lactilube. ------------ Pt aware of results Will come have labs drawn today Lab ordered Nothing further needed.

## 2013-11-18 LAB — RHEUMATOID FACTOR: Rhuematoid fact SerPl-aCnc: <10 IU/mL (ref <=14)

## 2013-11-20 ENCOUNTER — Ambulatory Visit: Payer: 59

## 2013-11-20 LAB — ANA: Anti Nuclear Antibody(ANA): NEGATIVE

## 2013-11-20 LAB — ANCA SCREEN W REFLEX TITER
Atypical p-ANCA Screen: NEGATIVE
c-ANCA Screen: NEGATIVE
p-ANCA Screen: NEGATIVE

## 2013-11-21 ENCOUNTER — Ambulatory Visit (INDEPENDENT_AMBULATORY_CARE_PROVIDER_SITE_OTHER): Payer: 59

## 2013-11-21 DIAGNOSIS — J309 Allergic rhinitis, unspecified: Secondary | ICD-10-CM

## 2013-11-28 ENCOUNTER — Encounter: Payer: Self-pay | Admitting: Internal Medicine

## 2013-12-05 ENCOUNTER — Ambulatory Visit (INDEPENDENT_AMBULATORY_CARE_PROVIDER_SITE_OTHER): Payer: 59

## 2013-12-05 DIAGNOSIS — J309 Allergic rhinitis, unspecified: Secondary | ICD-10-CM

## 2013-12-06 ENCOUNTER — Telehealth: Payer: Self-pay | Admitting: Internal Medicine

## 2013-12-06 DIAGNOSIS — J3089 Other allergic rhinitis: Principal | ICD-10-CM

## 2013-12-06 DIAGNOSIS — J302 Other seasonal allergic rhinitis: Secondary | ICD-10-CM

## 2013-12-07 ENCOUNTER — Other Ambulatory Visit: Payer: 59

## 2013-12-07 DIAGNOSIS — J3089 Other allergic rhinitis: Principal | ICD-10-CM

## 2013-12-07 DIAGNOSIS — J302 Other seasonal allergic rhinitis: Secondary | ICD-10-CM

## 2013-12-07 NOTE — Telephone Encounter (Signed)
Please order lab- Mold profile

## 2013-12-07 NOTE — Telephone Encounter (Signed)
Called Dr. Berle Mull office at [phone] and spoke to receptionist. Dr. Ernesto Rutherford is wanting to speak to Dr. Annamaria Boots. The receptionist stated Dr. Ernesto Rutherford was in a pt's room and was unsure what the call is in regards to and the best time to call back would be 1100.   Will forward to Dr. Annamaria Boots to make aware.

## 2013-12-07 NOTE — Telephone Encounter (Signed)
Called and spoke to pt. Informed pt of mold profile per CY. Pt verbalized understanding. Pt stated his wife, Mrs. Lewis Moccasin, wanted an appt to see CY for a cough. Appt made for 12/27/2013 at 11:15. Order placed for Dr. Ernesto Rutherford. Nothing further needed.

## 2013-12-08 LAB — MOLD PROFILE
Alternaria Alternata: <0.1 kU/L
Aspergillus fumigatus, m3: <0.1 kU/L
Cladosporium Herbarum: <0.1 kU/L
Helminthosporium halodes: <0.1 kU/L
Penicillium Notatum: <0.1 kU/L

## 2013-12-12 ENCOUNTER — Ambulatory Visit (INDEPENDENT_AMBULATORY_CARE_PROVIDER_SITE_OTHER): Payer: 59

## 2013-12-12 DIAGNOSIS — J309 Allergic rhinitis, unspecified: Secondary | ICD-10-CM

## 2013-12-19 ENCOUNTER — Ambulatory Visit: Payer: 59

## 2013-12-26 ENCOUNTER — Ambulatory Visit (INDEPENDENT_AMBULATORY_CARE_PROVIDER_SITE_OTHER): Payer: 59

## 2013-12-26 DIAGNOSIS — J309 Allergic rhinitis, unspecified: Secondary | ICD-10-CM

## 2013-12-27 ENCOUNTER — Ambulatory Visit: Payer: 59

## 2014-01-01 ENCOUNTER — Ambulatory Visit (INDEPENDENT_AMBULATORY_CARE_PROVIDER_SITE_OTHER): Payer: 59

## 2014-01-01 DIAGNOSIS — J309 Allergic rhinitis, unspecified: Secondary | ICD-10-CM

## 2014-01-02 ENCOUNTER — Ambulatory Visit (INDEPENDENT_AMBULATORY_CARE_PROVIDER_SITE_OTHER): Payer: 59

## 2014-01-02 DIAGNOSIS — Z23 Encounter for immunization: Secondary | ICD-10-CM

## 2014-01-02 DIAGNOSIS — J309 Allergic rhinitis, unspecified: Secondary | ICD-10-CM

## 2014-01-09 ENCOUNTER — Ambulatory Visit (INDEPENDENT_AMBULATORY_CARE_PROVIDER_SITE_OTHER): Payer: 59

## 2014-01-09 DIAGNOSIS — J309 Allergic rhinitis, unspecified: Secondary | ICD-10-CM

## 2014-01-09 DIAGNOSIS — Z23 Encounter for immunization: Secondary | ICD-10-CM

## 2014-01-16 ENCOUNTER — Ambulatory Visit (INDEPENDENT_AMBULATORY_CARE_PROVIDER_SITE_OTHER): Payer: 59

## 2014-01-16 DIAGNOSIS — J309 Allergic rhinitis, unspecified: Secondary | ICD-10-CM

## 2014-01-24 ENCOUNTER — Encounter: Payer: Self-pay | Admitting: Internal Medicine

## 2014-01-30 ENCOUNTER — Ambulatory Visit (INDEPENDENT_AMBULATORY_CARE_PROVIDER_SITE_OTHER): Payer: 59

## 2014-01-30 DIAGNOSIS — J309 Allergic rhinitis, unspecified: Secondary | ICD-10-CM

## 2014-02-05 ENCOUNTER — Ambulatory Visit (INDEPENDENT_AMBULATORY_CARE_PROVIDER_SITE_OTHER): Payer: 59

## 2014-02-05 DIAGNOSIS — J309 Allergic rhinitis, unspecified: Secondary | ICD-10-CM

## 2014-02-06 ENCOUNTER — Ambulatory Visit (INDEPENDENT_AMBULATORY_CARE_PROVIDER_SITE_OTHER): Payer: 59

## 2014-02-06 DIAGNOSIS — J309 Allergic rhinitis, unspecified: Secondary | ICD-10-CM

## 2014-02-08 ENCOUNTER — Telehealth: Payer: Self-pay | Admitting: Internal Medicine

## 2014-02-08 NOTE — Telephone Encounter (Signed)
Called and spoke with wes at Dr. Mackey Birchwood office and he is aware that this has been faxed to number given.

## 2014-02-13 ENCOUNTER — Ambulatory Visit (INDEPENDENT_AMBULATORY_CARE_PROVIDER_SITE_OTHER): Payer: 59

## 2014-02-13 DIAGNOSIS — J309 Allergic rhinitis, unspecified: Secondary | ICD-10-CM

## 2014-02-20 ENCOUNTER — Ambulatory Visit (INDEPENDENT_AMBULATORY_CARE_PROVIDER_SITE_OTHER): Payer: 59

## 2014-02-20 DIAGNOSIS — J309 Allergic rhinitis, unspecified: Secondary | ICD-10-CM

## 2014-02-22 ENCOUNTER — Encounter: Payer: Self-pay | Admitting: Internal Medicine

## 2014-02-27 ENCOUNTER — Ambulatory Visit (INDEPENDENT_AMBULATORY_CARE_PROVIDER_SITE_OTHER): Payer: 59

## 2014-02-27 DIAGNOSIS — J309 Allergic rhinitis, unspecified: Secondary | ICD-10-CM

## 2014-03-06 ENCOUNTER — Ambulatory Visit (INDEPENDENT_AMBULATORY_CARE_PROVIDER_SITE_OTHER): Payer: 59

## 2014-03-06 DIAGNOSIS — J309 Allergic rhinitis, unspecified: Secondary | ICD-10-CM

## 2014-03-12 ENCOUNTER — Encounter: Payer: Self-pay | Admitting: Internal Medicine

## 2014-03-13 ENCOUNTER — Ambulatory Visit (INDEPENDENT_AMBULATORY_CARE_PROVIDER_SITE_OTHER): Payer: 59

## 2014-03-13 DIAGNOSIS — J309 Allergic rhinitis, unspecified: Secondary | ICD-10-CM

## 2014-03-14 ENCOUNTER — Ambulatory Visit (INDEPENDENT_AMBULATORY_CARE_PROVIDER_SITE_OTHER): Payer: 59

## 2014-03-14 DIAGNOSIS — J309 Allergic rhinitis, unspecified: Secondary | ICD-10-CM

## 2014-03-20 ENCOUNTER — Ambulatory Visit (INDEPENDENT_AMBULATORY_CARE_PROVIDER_SITE_OTHER): Payer: 59

## 2014-03-20 DIAGNOSIS — J309 Allergic rhinitis, unspecified: Secondary | ICD-10-CM

## 2014-03-27 ENCOUNTER — Ambulatory Visit (INDEPENDENT_AMBULATORY_CARE_PROVIDER_SITE_OTHER): Payer: 59

## 2014-03-27 DIAGNOSIS — J309 Allergic rhinitis, unspecified: Secondary | ICD-10-CM

## 2014-04-03 ENCOUNTER — Ambulatory Visit (INDEPENDENT_AMBULATORY_CARE_PROVIDER_SITE_OTHER): Payer: 59

## 2014-04-03 DIAGNOSIS — J309 Allergic rhinitis, unspecified: Secondary | ICD-10-CM

## 2014-04-17 ENCOUNTER — Ambulatory Visit (INDEPENDENT_AMBULATORY_CARE_PROVIDER_SITE_OTHER): Payer: 59

## 2014-04-17 DIAGNOSIS — J309 Allergic rhinitis, unspecified: Secondary | ICD-10-CM

## 2014-04-24 ENCOUNTER — Ambulatory Visit (INDEPENDENT_AMBULATORY_CARE_PROVIDER_SITE_OTHER): Payer: 59

## 2014-04-24 DIAGNOSIS — J309 Allergic rhinitis, unspecified: Secondary | ICD-10-CM

## 2014-05-08 ENCOUNTER — Ambulatory Visit (INDEPENDENT_AMBULATORY_CARE_PROVIDER_SITE_OTHER): Payer: 59

## 2014-05-08 DIAGNOSIS — J309 Allergic rhinitis, unspecified: Secondary | ICD-10-CM

## 2014-05-15 ENCOUNTER — Ambulatory Visit (INDEPENDENT_AMBULATORY_CARE_PROVIDER_SITE_OTHER): Payer: 59

## 2014-05-15 DIAGNOSIS — J309 Allergic rhinitis, unspecified: Secondary | ICD-10-CM

## 2014-05-22 ENCOUNTER — Ambulatory Visit (INDEPENDENT_AMBULATORY_CARE_PROVIDER_SITE_OTHER): Payer: 59

## 2014-05-22 DIAGNOSIS — J309 Allergic rhinitis, unspecified: Secondary | ICD-10-CM

## 2014-05-23 ENCOUNTER — Ambulatory Visit (INDEPENDENT_AMBULATORY_CARE_PROVIDER_SITE_OTHER): Payer: 59

## 2014-05-23 DIAGNOSIS — J309 Allergic rhinitis, unspecified: Secondary | ICD-10-CM

## 2014-05-28 ENCOUNTER — Encounter: Payer: Self-pay | Admitting: Internal Medicine

## 2014-05-29 ENCOUNTER — Ambulatory Visit (INDEPENDENT_AMBULATORY_CARE_PROVIDER_SITE_OTHER): Payer: 59

## 2014-05-29 DIAGNOSIS — J309 Allergic rhinitis, unspecified: Secondary | ICD-10-CM

## 2014-06-05 ENCOUNTER — Ambulatory Visit (INDEPENDENT_AMBULATORY_CARE_PROVIDER_SITE_OTHER): Payer: 59

## 2014-06-05 DIAGNOSIS — J309 Allergic rhinitis, unspecified: Secondary | ICD-10-CM

## 2014-06-12 ENCOUNTER — Ambulatory Visit (INDEPENDENT_AMBULATORY_CARE_PROVIDER_SITE_OTHER): Payer: 59

## 2014-06-12 DIAGNOSIS — J309 Allergic rhinitis, unspecified: Secondary | ICD-10-CM

## 2014-06-20 ENCOUNTER — Ambulatory Visit (INDEPENDENT_AMBULATORY_CARE_PROVIDER_SITE_OTHER): Payer: 59

## 2014-06-20 DIAGNOSIS — J309 Allergic rhinitis, unspecified: Secondary | ICD-10-CM

## 2014-06-26 ENCOUNTER — Ambulatory Visit (INDEPENDENT_AMBULATORY_CARE_PROVIDER_SITE_OTHER): Payer: 59

## 2014-06-26 DIAGNOSIS — J309 Allergic rhinitis, unspecified: Secondary | ICD-10-CM

## 2014-06-29 ENCOUNTER — Encounter: Payer: Self-pay | Admitting: Internal Medicine

## 2014-07-03 ENCOUNTER — Ambulatory Visit (INDEPENDENT_AMBULATORY_CARE_PROVIDER_SITE_OTHER): Payer: 59

## 2014-07-03 DIAGNOSIS — J309 Allergic rhinitis, unspecified: Secondary | ICD-10-CM

## 2014-07-05 ENCOUNTER — Ambulatory Visit (INDEPENDENT_AMBULATORY_CARE_PROVIDER_SITE_OTHER): Payer: 59

## 2014-07-05 DIAGNOSIS — J309 Allergic rhinitis, unspecified: Secondary | ICD-10-CM

## 2014-07-10 ENCOUNTER — Ambulatory Visit (INDEPENDENT_AMBULATORY_CARE_PROVIDER_SITE_OTHER): Payer: 59

## 2014-07-10 DIAGNOSIS — J309 Allergic rhinitis, unspecified: Secondary | ICD-10-CM

## 2014-07-24 ENCOUNTER — Ambulatory Visit (INDEPENDENT_AMBULATORY_CARE_PROVIDER_SITE_OTHER): Payer: 59

## 2014-07-24 DIAGNOSIS — J309 Allergic rhinitis, unspecified: Secondary | ICD-10-CM

## 2014-07-31 ENCOUNTER — Ambulatory Visit (INDEPENDENT_AMBULATORY_CARE_PROVIDER_SITE_OTHER): Payer: 59

## 2014-07-31 DIAGNOSIS — J309 Allergic rhinitis, unspecified: Secondary | ICD-10-CM | POA: Diagnosis not present

## 2014-08-07 ENCOUNTER — Ambulatory Visit (INDEPENDENT_AMBULATORY_CARE_PROVIDER_SITE_OTHER): Payer: 59

## 2014-08-07 DIAGNOSIS — J309 Allergic rhinitis, unspecified: Secondary | ICD-10-CM | POA: Diagnosis not present

## 2014-08-14 ENCOUNTER — Ambulatory Visit (INDEPENDENT_AMBULATORY_CARE_PROVIDER_SITE_OTHER): Payer: 59

## 2014-08-14 DIAGNOSIS — J309 Allergic rhinitis, unspecified: Secondary | ICD-10-CM

## 2014-08-21 ENCOUNTER — Ambulatory Visit (INDEPENDENT_AMBULATORY_CARE_PROVIDER_SITE_OTHER): Payer: 59

## 2014-08-21 DIAGNOSIS — J309 Allergic rhinitis, unspecified: Secondary | ICD-10-CM

## 2014-08-28 ENCOUNTER — Ambulatory Visit (INDEPENDENT_AMBULATORY_CARE_PROVIDER_SITE_OTHER): Payer: 59

## 2014-08-28 DIAGNOSIS — J309 Allergic rhinitis, unspecified: Secondary | ICD-10-CM

## 2014-09-03 ENCOUNTER — Encounter: Payer: Self-pay | Admitting: Internal Medicine

## 2014-09-04 ENCOUNTER — Ambulatory Visit (INDEPENDENT_AMBULATORY_CARE_PROVIDER_SITE_OTHER): Payer: 59

## 2014-09-04 DIAGNOSIS — J309 Allergic rhinitis, unspecified: Secondary | ICD-10-CM | POA: Diagnosis not present

## 2014-09-05 ENCOUNTER — Telehealth: Payer: Self-pay | Admitting: Internal Medicine

## 2014-09-05 NOTE — Telephone Encounter (Signed)
Intense burning and itching of eyes began again this spring, consistent with exacerb of seasonal allergic conjunctivitis. We discussed increasing his allergy vaccine strength from 1:10 to 1:2. Order given to allergy lab.

## 2014-09-10 ENCOUNTER — Telehealth: Payer: Self-pay | Admitting: Internal Medicine

## 2014-09-10 NOTE — Telephone Encounter (Signed)
Error

## 2014-09-10 NOTE — Telephone Encounter (Signed)
Discussed.

## 2014-09-10 NOTE — Telephone Encounter (Signed)
Dr.Young I did not get a rx for him to go to 1:10 for vial C (7 grass). Sorry! I should have ask for one. Sorry! He's almost through with 1:50 (vial C) if you would you please make me a 1:10 rx for vial C  I will combine it with Vial A (The other grasses he's allergic to.),unless you tell me different. Once he gets to 0.5 of 1:10 I will then go ahead and make the 1:2 rx you gave me, again unless you tell me different.( & build according to protocol (0.1 to 0.5))  Thanks, T.Scott

## 2014-09-11 ENCOUNTER — Ambulatory Visit (INDEPENDENT_AMBULATORY_CARE_PROVIDER_SITE_OTHER): Payer: 59

## 2014-09-11 DIAGNOSIS — J309 Allergic rhinitis, unspecified: Secondary | ICD-10-CM

## 2014-09-18 ENCOUNTER — Ambulatory Visit (INDEPENDENT_AMBULATORY_CARE_PROVIDER_SITE_OTHER): Payer: 59

## 2014-09-18 DIAGNOSIS — J309 Allergic rhinitis, unspecified: Secondary | ICD-10-CM

## 2014-10-02 ENCOUNTER — Ambulatory Visit (INDEPENDENT_AMBULATORY_CARE_PROVIDER_SITE_OTHER): Payer: 59

## 2014-10-02 ENCOUNTER — Telehealth: Payer: Self-pay | Admitting: Internal Medicine

## 2014-10-02 DIAGNOSIS — J309 Allergic rhinitis, unspecified: Secondary | ICD-10-CM | POA: Diagnosis not present

## 2014-10-02 NOTE — Telephone Encounter (Signed)
OOOPS! I put Dr.Mckesson down for 10/03/14 on last wk.'s sign in sheet. Please put him on the allergy shot sch. For 10/02/14.  Thanks!!

## 2014-10-02 NOTE — Telephone Encounter (Signed)
Shelby didn't get the phone note so I sky ped her the info. She needed.

## 2014-10-03 ENCOUNTER — Ambulatory Visit: Payer: 59

## 2014-10-04 ENCOUNTER — Encounter: Payer: Self-pay | Admitting: Gastroenterology

## 2014-10-09 ENCOUNTER — Ambulatory Visit (INDEPENDENT_AMBULATORY_CARE_PROVIDER_SITE_OTHER): Payer: 59

## 2014-10-09 DIAGNOSIS — J309 Allergic rhinitis, unspecified: Secondary | ICD-10-CM | POA: Diagnosis not present

## 2014-10-16 ENCOUNTER — Ambulatory Visit (INDEPENDENT_AMBULATORY_CARE_PROVIDER_SITE_OTHER): Payer: 59

## 2014-10-16 DIAGNOSIS — J309 Allergic rhinitis, unspecified: Secondary | ICD-10-CM | POA: Diagnosis not present

## 2014-10-30 ENCOUNTER — Ambulatory Visit (INDEPENDENT_AMBULATORY_CARE_PROVIDER_SITE_OTHER): Payer: 59

## 2014-10-30 DIAGNOSIS — J309 Allergic rhinitis, unspecified: Secondary | ICD-10-CM

## 2014-11-01 ENCOUNTER — Encounter: Payer: Self-pay | Admitting: Cardiology

## 2014-11-01 ENCOUNTER — Ambulatory Visit (INDEPENDENT_AMBULATORY_CARE_PROVIDER_SITE_OTHER): Payer: 59 | Admitting: Cardiology

## 2014-11-01 VITALS — BP 128/72 | HR 61 | Ht 75.0 in | Wt 217.0 lb

## 2014-11-01 DIAGNOSIS — R0609 Other forms of dyspnea: Secondary | ICD-10-CM | POA: Diagnosis not present

## 2014-11-01 DIAGNOSIS — R0600 Dyspnea, unspecified: Secondary | ICD-10-CM | POA: Insufficient documentation

## 2014-11-01 NOTE — Progress Notes (Signed)
Cardiology Office Note   Date:  11/01/2014   ID:  Harry Goltz MD, DOB 03/09/1941, MRN [id number]  PCP:  Warren Danes, MD  Cardiologist: Darlin Coco MD  Chief Complaint  Patient presents with  . Hyperlipidemia      History of Present Illness: Harry Goltz MD is a 74 y.o. male who presents for evaluation of dyspnea. The patient has been in good general health.  Earlier this summer while vacationing out [place] he was doing some hiking and experienced unusual dyspnea and transient weakness.  This was while he was hiking in the [facility].  He has not had any symptoms walking on level ground.  He is concerned because of his family history of his father having severe coronary disease.  His last stress test was 09/17/08 and at that time showed no ischemia.  The patient is on no cardiac medication.  He does have a history of allergies and takes Nasonex as needed.  He has a past history of mild hypercholesterolemia but no longer takes atorvastatin.    Past Medical History  Diagnosis Date  . Allergic rhinitis   . Hyperlipidemia     Past Surgical History  Procedure Laterality Date  . Nasal sinus surgery    . Hand surgery  age 29    for infection  . Abdominal aorta ultrasound  07/22/2009    mild atherosclerotic changes without aneurysm  . Nm myoview ltd  03/19/2009    normal/ EF- 68%     Current Outpatient Prescriptions  Medication Sig Dispense Refill  . mometasone (NASONEX) 50 MCG/ACT nasal spray Place 2 sprays into the nose daily. (Patient taking differently: Place 2 sprays into the nose daily. SEASONAL ALLERGIES) 17 g 2  . predniSONE (DELTASONE) 5 MG tablet 2 tabs (10 mg) every other day while needed 50 tablet 1   No current facility-administered medications for this visit.    Allergies:   Review of patient's allergies indicates no known allergies.    Social History:  The patient  reports that he quit smoking about 3 years ago. His smoking use included Pipe.  He does not have any smokeless tobacco history on file. He reports that he does not use illicit drugs.   Family History:  The patient's family history includes Allergic rhinitis in an other family member; Aortic aneurysm (age of onset: 29) in his father; Hypertension in his brother; Lung cancer (age of onset: 62) in his mother. There is no history of Heart attack or Stroke.    ROS:  Please see the history of present illness.   Otherwise, review of systems are positive for none.   All other systems are reviewed and negative.    PHYSICAL EXAM: VS:  BP 128/72 mmHg  Pulse 61  Ht 6\' 3"  (1.905 m)  Wt 217 lb (98.431 kg)  BMI 27.12 kg/m2 , BMI Body mass index is 27.12 kg/(m^2). GEN: Well nourished, well developed, in no acute distress HEENT: normal Neck: no JVD, carotid bruits, or masses Cardiac: RRR; no murmurs, rubs, or gallops,no edema  Respiratory:  clear to auscultation bilaterally, normal work of breathing GI: soft, nontender, nondistended, + BS MS: no deformity or atrophy Skin: warm and dry, no rash Neuro:  Strength and sensation are intact Psych: euthymic mood, full affect   EKG:  EKG is ordered today. The ekg ordered today demonstrates normal sinus rhythm at 61 bpm.  Within normal limits   Recent Labs: 11/17/2013: Hemoglobin 14.8; Platelets 197.0  Lipid Panel    Component Value Date/Time   CHOL 206* 05/17/2012 0817   TRIG 68.0 05/17/2012 0817   HDL 46.80 05/17/2012 0817   CHOLHDL 4 05/17/2012 0817   VLDL 13.6 05/17/2012 0817   LDLDIRECT 145.5 05/17/2012 0817      Wt Readings from Last 3 Encounters:  11/01/14 217 lb (98.431 kg)  01/18/13 222 lb (100.699 kg)  03/04/10 222 lb 6.1 oz (100.871 kg)        ASSESSMENT AND PLAN:  1.  Exertional dyspnea 2.  Hypercholesterolemia 3.  Family history of coronary artery disease 4.  History of seasonal allergies   Current medicines are reviewed at length with the patient today.  The patient does not have concerns  regarding medicines.  The following changes have been made:  no change  Labs/ tests ordered today include:  Orders Placed This Encounter  Procedures  . Exercise Tolerance Test  . EKG 12-Lead    Disposition: We will have him return for a exercise treadmill test.  Signed, Darlin Coco MD 11/01/2014 1:52 PM    [facility] [place], [place], [place]  [address] Phone: [phone]; Fax: [phone]

## 2014-11-01 NOTE — Patient Instructions (Signed)
Medication Instructions:  Your physician recommends that you continue on your current medications as directed. Please refer to the Current Medication list given to you today.  Labwork: NONE  Testing/Procedures: Your physician has requested that you have an exercise tolerance test. For further information please visit [email or web address]. Please also follow instruction sheet, as given.   Follow-Up: AS NEEDED

## 2014-11-06 ENCOUNTER — Ambulatory Visit (INDEPENDENT_AMBULATORY_CARE_PROVIDER_SITE_OTHER): Payer: 59

## 2014-11-06 DIAGNOSIS — J309 Allergic rhinitis, unspecified: Secondary | ICD-10-CM

## 2014-11-12 ENCOUNTER — Encounter: Payer: Self-pay | Admitting: Internal Medicine

## 2014-11-20 ENCOUNTER — Telehealth (HOSPITAL_COMMUNITY): Payer: Self-pay

## 2014-11-20 ENCOUNTER — Ambulatory Visit (INDEPENDENT_AMBULATORY_CARE_PROVIDER_SITE_OTHER): Payer: 59

## 2014-11-20 DIAGNOSIS — J309 Allergic rhinitis, unspecified: Secondary | ICD-10-CM | POA: Diagnosis not present

## 2014-11-20 NOTE — Telephone Encounter (Signed)
Encounter complete. 

## 2014-11-22 ENCOUNTER — Ambulatory Visit (HOSPITAL_COMMUNITY)
Admission: RE | Admit: 2014-11-22 | Discharge: 2014-11-22 | Disposition: A | Discharge status: Home / Self Care | Payer: 59 | Source: Ambulatory Visit | Attending: Cardiovascular Disease | Admitting: Cardiovascular Disease

## 2014-11-22 DIAGNOSIS — R0600 Dyspnea, unspecified: Secondary | ICD-10-CM

## 2014-11-22 DIAGNOSIS — R0609 Other forms of dyspnea: Secondary | ICD-10-CM | POA: Diagnosis present

## 2014-11-22 LAB — EXERCISE TOLERANCE TEST
Estimated workload: 11.9 METS
Exercise duration (min): 10 min
Exercise duration (sec): 8 s
MPHR: 146 {beats}/min
Peak HR: 146 {beats}/min
Percent HR: 100 %
RPE: 14
Rest HR: 62 {beats}/min

## 2014-11-23 ENCOUNTER — Telehealth: Payer: Self-pay | Admitting: Cardiology

## 2014-11-23 NOTE — Telephone Encounter (Signed)
New message     Pt calling about test results Please call to discuss

## 2014-11-23 NOTE — Telephone Encounter (Signed)
PT  AWARE .APPEARS  GXT WAS NORMAL,   NO ISCHEMIA ,NO EKG  CHANGES, OTHER THAN RARE  PVC IN RECOVERY HEART  RATE  GOAL  MET  .Adonis Housekeeper

## 2014-11-27 ENCOUNTER — Ambulatory Visit (INDEPENDENT_AMBULATORY_CARE_PROVIDER_SITE_OTHER): Payer: 59

## 2014-11-27 DIAGNOSIS — J309 Allergic rhinitis, unspecified: Secondary | ICD-10-CM | POA: Diagnosis not present

## 2014-12-07 ENCOUNTER — Encounter: Payer: Self-pay | Admitting: Internal Medicine

## 2014-12-11 ENCOUNTER — Ambulatory Visit (INDEPENDENT_AMBULATORY_CARE_PROVIDER_SITE_OTHER): Payer: 59

## 2014-12-11 DIAGNOSIS — J309 Allergic rhinitis, unspecified: Secondary | ICD-10-CM

## 2014-12-14 ENCOUNTER — Encounter: Payer: 59 | Admitting: Nurse Practitioner

## 2014-12-25 ENCOUNTER — Ambulatory Visit (INDEPENDENT_AMBULATORY_CARE_PROVIDER_SITE_OTHER): Payer: 59

## 2014-12-25 DIAGNOSIS — J309 Allergic rhinitis, unspecified: Secondary | ICD-10-CM

## 2015-01-01 ENCOUNTER — Ambulatory Visit: Payer: 59

## 2015-01-15 ENCOUNTER — Ambulatory Visit (INDEPENDENT_AMBULATORY_CARE_PROVIDER_SITE_OTHER): Payer: 59

## 2015-01-15 DIAGNOSIS — J309 Allergic rhinitis, unspecified: Secondary | ICD-10-CM | POA: Diagnosis not present

## 2015-01-22 ENCOUNTER — Ambulatory Visit (INDEPENDENT_AMBULATORY_CARE_PROVIDER_SITE_OTHER): Payer: 59

## 2015-01-22 DIAGNOSIS — J309 Allergic rhinitis, unspecified: Secondary | ICD-10-CM | POA: Diagnosis not present

## 2015-01-29 ENCOUNTER — Ambulatory Visit (INDEPENDENT_AMBULATORY_CARE_PROVIDER_SITE_OTHER): Payer: 59

## 2015-01-29 DIAGNOSIS — J309 Allergic rhinitis, unspecified: Secondary | ICD-10-CM | POA: Diagnosis not present

## 2015-02-05 ENCOUNTER — Ambulatory Visit (INDEPENDENT_AMBULATORY_CARE_PROVIDER_SITE_OTHER): Payer: 59

## 2015-02-05 DIAGNOSIS — J309 Allergic rhinitis, unspecified: Secondary | ICD-10-CM

## 2015-02-12 ENCOUNTER — Ambulatory Visit (INDEPENDENT_AMBULATORY_CARE_PROVIDER_SITE_OTHER): Payer: 59

## 2015-02-12 DIAGNOSIS — J309 Allergic rhinitis, unspecified: Secondary | ICD-10-CM

## 2015-02-19 ENCOUNTER — Ambulatory Visit (INDEPENDENT_AMBULATORY_CARE_PROVIDER_SITE_OTHER): Payer: 59

## 2015-02-19 DIAGNOSIS — J309 Allergic rhinitis, unspecified: Secondary | ICD-10-CM | POA: Diagnosis not present

## 2015-02-20 ENCOUNTER — Telehealth: Payer: Self-pay | Admitting: Internal Medicine

## 2015-02-20 NOTE — Telephone Encounter (Signed)
Allergy Serum Extract Date Mixed: 02/20/15 Vial: AB Strength: 1:10 Here/Mail/Pick Up: Here Mixed By: Desmond Dike, CMA

## 2015-02-21 ENCOUNTER — Ambulatory Visit (INDEPENDENT_AMBULATORY_CARE_PROVIDER_SITE_OTHER): Payer: 59

## 2015-02-21 DIAGNOSIS — J309 Allergic rhinitis, unspecified: Secondary | ICD-10-CM

## 2015-02-22 ENCOUNTER — Ambulatory Visit: Payer: 59

## 2015-02-25 ENCOUNTER — Encounter: Payer: Self-pay | Admitting: Internal Medicine

## 2015-03-05 ENCOUNTER — Ambulatory Visit (INDEPENDENT_AMBULATORY_CARE_PROVIDER_SITE_OTHER): Payer: 59

## 2015-03-05 DIAGNOSIS — J309 Allergic rhinitis, unspecified: Secondary | ICD-10-CM

## 2015-03-08 ENCOUNTER — Telehealth: Payer: Self-pay | Admitting: Internal Medicine
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
## 2015-03-12 ENCOUNTER — Ambulatory Visit (INDEPENDENT_AMBULATORY_CARE_PROVIDER_SITE_OTHER): Payer: 59

## 2015-03-12 DIAGNOSIS — J309 Allergic rhinitis, unspecified: Secondary | ICD-10-CM

## 2015-03-19 ENCOUNTER — Ambulatory Visit (INDEPENDENT_AMBULATORY_CARE_PROVIDER_SITE_OTHER): Payer: 59

## 2015-03-19 DIAGNOSIS — J309 Allergic rhinitis, unspecified: Secondary | ICD-10-CM | POA: Diagnosis not present

## 2015-03-25 ENCOUNTER — Ambulatory Visit (INDEPENDENT_AMBULATORY_CARE_PROVIDER_SITE_OTHER)
Admission: RE | Admit: 2015-03-25 | Discharge: 2015-03-25 | Disposition: A | Discharge status: Home / Self Care | Payer: 59 | Source: Ambulatory Visit | Attending: Internal Medicine | Admitting: Internal Medicine

## 2015-03-25 ENCOUNTER — Telehealth: Payer: Self-pay | Admitting: Internal Medicine

## 2015-03-25 ENCOUNTER — Other Ambulatory Visit: Payer: Self-pay | Admitting: Internal Medicine

## 2015-03-25 DIAGNOSIS — R05 Cough: Secondary | ICD-10-CM

## 2015-03-25 DIAGNOSIS — R059 Cough, unspecified: Secondary | ICD-10-CM

## 2015-03-25 NOTE — Telephone Encounter (Signed)
Productive cough "cloudy" since Thanksgiving. Would like CXR. Suspect post viral bronchitis Plan- CXR for  cough

## 2015-03-26 ENCOUNTER — Ambulatory Visit (INDEPENDENT_AMBULATORY_CARE_PROVIDER_SITE_OTHER): Payer: 59

## 2015-03-26 DIAGNOSIS — J309 Allergic rhinitis, unspecified: Secondary | ICD-10-CM

## 2015-04-02 ENCOUNTER — Ambulatory Visit (INDEPENDENT_AMBULATORY_CARE_PROVIDER_SITE_OTHER): Payer: 59

## 2015-04-02 DIAGNOSIS — J309 Allergic rhinitis, unspecified: Secondary | ICD-10-CM | POA: Diagnosis not present

## 2015-04-09 ENCOUNTER — Ambulatory Visit (INDEPENDENT_AMBULATORY_CARE_PROVIDER_SITE_OTHER): Payer: 59

## 2015-04-09 DIAGNOSIS — J309 Allergic rhinitis, unspecified: Secondary | ICD-10-CM

## 2015-04-16 ENCOUNTER — Ambulatory Visit (INDEPENDENT_AMBULATORY_CARE_PROVIDER_SITE_OTHER): Payer: 59

## 2015-04-16 DIAGNOSIS — J309 Allergic rhinitis, unspecified: Secondary | ICD-10-CM

## 2015-04-30 ENCOUNTER — Ambulatory Visit (INDEPENDENT_AMBULATORY_CARE_PROVIDER_SITE_OTHER): Payer: 59

## 2015-04-30 DIAGNOSIS — J309 Allergic rhinitis, unspecified: Secondary | ICD-10-CM

## 2015-05-07 ENCOUNTER — Ambulatory Visit (INDEPENDENT_AMBULATORY_CARE_PROVIDER_SITE_OTHER): Payer: 59

## 2015-05-07 DIAGNOSIS — J309 Allergic rhinitis, unspecified: Secondary | ICD-10-CM

## 2015-05-14 ENCOUNTER — Ambulatory Visit (INDEPENDENT_AMBULATORY_CARE_PROVIDER_SITE_OTHER): Payer: 59

## 2015-05-14 DIAGNOSIS — J309 Allergic rhinitis, unspecified: Secondary | ICD-10-CM

## 2015-05-21 ENCOUNTER — Ambulatory Visit (INDEPENDENT_AMBULATORY_CARE_PROVIDER_SITE_OTHER): Payer: 59

## 2015-05-21 DIAGNOSIS — J309 Allergic rhinitis, unspecified: Secondary | ICD-10-CM

## 2015-05-28 ENCOUNTER — Ambulatory Visit (INDEPENDENT_AMBULATORY_CARE_PROVIDER_SITE_OTHER): Payer: 59

## 2015-05-28 DIAGNOSIS — J309 Allergic rhinitis, unspecified: Secondary | ICD-10-CM | POA: Diagnosis not present

## 2015-06-04 ENCOUNTER — Ambulatory Visit (INDEPENDENT_AMBULATORY_CARE_PROVIDER_SITE_OTHER): Payer: 59

## 2015-06-04 DIAGNOSIS — J309 Allergic rhinitis, unspecified: Secondary | ICD-10-CM | POA: Diagnosis not present

## 2015-06-11 ENCOUNTER — Ambulatory Visit (INDEPENDENT_AMBULATORY_CARE_PROVIDER_SITE_OTHER): Payer: 59

## 2015-06-11 DIAGNOSIS — J309 Allergic rhinitis, unspecified: Secondary | ICD-10-CM

## 2015-06-18 ENCOUNTER — Ambulatory Visit (INDEPENDENT_AMBULATORY_CARE_PROVIDER_SITE_OTHER): Payer: 59 | Admitting: *Deleted

## 2015-06-18 DIAGNOSIS — J309 Allergic rhinitis, unspecified: Secondary | ICD-10-CM

## 2015-07-02 ENCOUNTER — Ambulatory Visit (INDEPENDENT_AMBULATORY_CARE_PROVIDER_SITE_OTHER): Payer: 59

## 2015-07-02 DIAGNOSIS — J309 Allergic rhinitis, unspecified: Secondary | ICD-10-CM | POA: Diagnosis not present

## 2015-07-03 DIAGNOSIS — D485 Neoplasm of uncertain behavior of skin: Secondary | ICD-10-CM | POA: Diagnosis not present

## 2015-07-03 DIAGNOSIS — L570 Actinic keratosis: Secondary | ICD-10-CM | POA: Diagnosis not present

## 2015-07-09 ENCOUNTER — Ambulatory Visit (INDEPENDENT_AMBULATORY_CARE_PROVIDER_SITE_OTHER): Payer: 59 | Admitting: *Deleted

## 2015-07-09 DIAGNOSIS — J309 Allergic rhinitis, unspecified: Secondary | ICD-10-CM | POA: Diagnosis not present

## 2015-07-16 ENCOUNTER — Telehealth: Payer: Self-pay | Admitting: Internal Medicine

## 2015-07-16 MED ORDER — AZITHROMYCIN 250 MG PO TABS: ORAL_TABLET | ORAL | Status: DC

## 2015-07-16 MED ORDER — HYDROCOD POLST-CPM POLST ER 10-8 MG/5ML PO SUER: 5.0000 mL | Freq: Two times a day (BID) | ORAL | Status: DC | PRN

## 2015-07-16 NOTE — Telephone Encounter (Signed)
Ok  Tussionex 200 ml      5 ml every 12 hours as needed for cough         Zpak    Ref x 1   Please call to [facility]

## 2015-07-16 NOTE — Telephone Encounter (Signed)
Patient states that he has the flu, headache, fatigue, cough, runny nose, off and on fever. Body aches. Patient started on TamiFlu Monday. Patient would like to speak directly to Dr. Annamaria Boots, asked that I send message and have Dr. Annamaria Boots call him.  Dr. Annamaria Boots, patient can be reached at [phone]   Current Outpatient Prescriptions on File Prior to Visit  Medication Sig Dispense Refill  . mometasone (NASONEX) 50 MCG/ACT nasal spray Place 2 sprays into the nose daily. (Patient taking differently: Place 2 sprays into the nose daily. SEASONAL ALLERGIES) 17 g 2  . NONFORMULARY OR COMPOUNDED ITEM Allergy Vaccine 1:10 Given at [facility]     No current facility-administered medications on file prior to visit.   No Known Allergies

## 2015-07-16 NOTE — Telephone Encounter (Signed)
Spoke with pt.  Zpak sent to pharmacy.  Tussionex rx mailed to pt's home per pt request.

## 2015-07-23 ENCOUNTER — Encounter: Payer: Self-pay | Admitting: Cardiology

## 2015-07-30 ENCOUNTER — Ambulatory Visit (INDEPENDENT_AMBULATORY_CARE_PROVIDER_SITE_OTHER): Payer: 59 | Admitting: *Deleted

## 2015-07-30 DIAGNOSIS — J309 Allergic rhinitis, unspecified: Secondary | ICD-10-CM | POA: Diagnosis not present

## 2015-08-06 ENCOUNTER — Ambulatory Visit (INDEPENDENT_AMBULATORY_CARE_PROVIDER_SITE_OTHER): Payer: 59 | Admitting: *Deleted

## 2015-08-06 DIAGNOSIS — J309 Allergic rhinitis, unspecified: Secondary | ICD-10-CM

## 2015-08-08 ENCOUNTER — Telehealth: Payer: Self-pay | Admitting: Internal Medicine

## 2015-08-08 DIAGNOSIS — J309 Allergic rhinitis, unspecified: Secondary | ICD-10-CM | POA: Diagnosis not present

## 2015-08-08 NOTE — Telephone Encounter (Signed)
Allergy Serum Extract Date Mixed: 08/08/15 Vial: 2 Strength: 1:10 Here/Mail/Pick Up: here Mixed By: tbs Last OV: 01/18/13 Pending OV: n/a

## 2015-08-13 ENCOUNTER — Ambulatory Visit (INDEPENDENT_AMBULATORY_CARE_PROVIDER_SITE_OTHER): Payer: 59 | Admitting: *Deleted

## 2015-08-13 DIAGNOSIS — J309 Allergic rhinitis, unspecified: Secondary | ICD-10-CM | POA: Diagnosis not present

## 2015-08-20 ENCOUNTER — Telehealth: Payer: Self-pay | Admitting: Internal Medicine

## 2015-08-20 ENCOUNTER — Ambulatory Visit (INDEPENDENT_AMBULATORY_CARE_PROVIDER_SITE_OTHER): Payer: 59 | Admitting: *Deleted

## 2015-08-20 DIAGNOSIS — J309 Allergic rhinitis, unspecified: Secondary | ICD-10-CM | POA: Diagnosis not present

## 2015-08-20 MED ORDER — MONTELUKAST SODIUM 10 MG PO TABS: 10.0000 mg | ORAL_TABLET | Freq: Every day | ORAL | Status: DC

## 2015-08-20 NOTE — Telephone Encounter (Signed)
Feels well except c/opersistent raspy cough with clear mucus since flu-like illness earlier this Spring. Discussed recent report possible flu-blunting effect of Singulair, at least for influenza pneumonia.

## 2015-08-26 ENCOUNTER — Ambulatory Visit (INDEPENDENT_AMBULATORY_CARE_PROVIDER_SITE_OTHER): Payer: 59 | Admitting: *Deleted

## 2015-08-26 ENCOUNTER — Telehealth: Payer: Self-pay | Admitting: Internal Medicine

## 2015-08-26 DIAGNOSIS — J309 Allergic rhinitis, unspecified: Secondary | ICD-10-CM | POA: Diagnosis not present

## 2015-08-26 NOTE — Telephone Encounter (Signed)
Can't shake lingering bronchitis since viral syndrome this winter. Raspy cough, scant white. Not better on singulair Imp- post viral bronchitis Plan- sample Bevespi 2 puffs, twice daily

## 2015-08-27 ENCOUNTER — Ambulatory Visit: Payer: 59

## 2015-09-03 ENCOUNTER — Ambulatory Visit (INDEPENDENT_AMBULATORY_CARE_PROVIDER_SITE_OTHER): Payer: 59 | Admitting: *Deleted

## 2015-09-03 DIAGNOSIS — J309 Allergic rhinitis, unspecified: Secondary | ICD-10-CM

## 2015-09-05 ENCOUNTER — Encounter: Payer: Self-pay | Admitting: Internal Medicine

## 2015-09-13 DIAGNOSIS — H524 Presbyopia: Secondary | ICD-10-CM | POA: Diagnosis not present

## 2015-09-13 DIAGNOSIS — H5203 Hypermetropia, bilateral: Secondary | ICD-10-CM | POA: Diagnosis not present

## 2015-09-17 ENCOUNTER — Ambulatory Visit (INDEPENDENT_AMBULATORY_CARE_PROVIDER_SITE_OTHER): Payer: 59 | Admitting: *Deleted

## 2015-09-17 DIAGNOSIS — J309 Allergic rhinitis, unspecified: Secondary | ICD-10-CM | POA: Diagnosis not present

## 2015-10-01 ENCOUNTER — Ambulatory Visit (INDEPENDENT_AMBULATORY_CARE_PROVIDER_SITE_OTHER): Payer: 59

## 2015-10-01 DIAGNOSIS — J309 Allergic rhinitis, unspecified: Secondary | ICD-10-CM | POA: Diagnosis not present

## 2015-10-08 ENCOUNTER — Ambulatory Visit (INDEPENDENT_AMBULATORY_CARE_PROVIDER_SITE_OTHER): Payer: 59 | Admitting: *Deleted

## 2015-10-08 DIAGNOSIS — J309 Allergic rhinitis, unspecified: Secondary | ICD-10-CM | POA: Diagnosis not present

## 2015-10-15 ENCOUNTER — Ambulatory Visit (INDEPENDENT_AMBULATORY_CARE_PROVIDER_SITE_OTHER): Payer: 59 | Admitting: *Deleted

## 2015-10-15 DIAGNOSIS — J309 Allergic rhinitis, unspecified: Secondary | ICD-10-CM | POA: Diagnosis not present

## 2015-10-29 ENCOUNTER — Ambulatory Visit (INDEPENDENT_AMBULATORY_CARE_PROVIDER_SITE_OTHER): Payer: 59 | Admitting: *Deleted

## 2015-10-29 DIAGNOSIS — J309 Allergic rhinitis, unspecified: Secondary | ICD-10-CM

## 2015-11-05 ENCOUNTER — Ambulatory Visit (INDEPENDENT_AMBULATORY_CARE_PROVIDER_SITE_OTHER): Payer: 59 | Admitting: *Deleted

## 2015-11-05 DIAGNOSIS — J309 Allergic rhinitis, unspecified: Secondary | ICD-10-CM

## 2015-11-19 ENCOUNTER — Ambulatory Visit (INDEPENDENT_AMBULATORY_CARE_PROVIDER_SITE_OTHER): Payer: 59 | Admitting: *Deleted

## 2015-11-19 DIAGNOSIS — J309 Allergic rhinitis, unspecified: Secondary | ICD-10-CM

## 2015-11-26 ENCOUNTER — Ambulatory Visit (INDEPENDENT_AMBULATORY_CARE_PROVIDER_SITE_OTHER): Payer: 59 | Admitting: *Deleted

## 2015-11-26 DIAGNOSIS — J309 Allergic rhinitis, unspecified: Secondary | ICD-10-CM

## 2015-12-10 ENCOUNTER — Ambulatory Visit (INDEPENDENT_AMBULATORY_CARE_PROVIDER_SITE_OTHER): Payer: 59 | Admitting: *Deleted

## 2015-12-10 DIAGNOSIS — J309 Allergic rhinitis, unspecified: Secondary | ICD-10-CM

## 2015-12-24 ENCOUNTER — Ambulatory Visit (INDEPENDENT_AMBULATORY_CARE_PROVIDER_SITE_OTHER): Payer: 59 | Admitting: *Deleted

## 2015-12-24 DIAGNOSIS — J309 Allergic rhinitis, unspecified: Secondary | ICD-10-CM | POA: Diagnosis not present

## 2015-12-31 ENCOUNTER — Ambulatory Visit (INDEPENDENT_AMBULATORY_CARE_PROVIDER_SITE_OTHER): Payer: 59 | Admitting: *Deleted

## 2015-12-31 DIAGNOSIS — J309 Allergic rhinitis, unspecified: Secondary | ICD-10-CM | POA: Diagnosis not present

## 2016-01-06 DIAGNOSIS — J309 Allergic rhinitis, unspecified: Secondary | ICD-10-CM | POA: Diagnosis not present

## 2016-01-07 ENCOUNTER — Ambulatory Visit (INDEPENDENT_AMBULATORY_CARE_PROVIDER_SITE_OTHER): Payer: 59 | Admitting: *Deleted

## 2016-01-07 DIAGNOSIS — J309 Allergic rhinitis, unspecified: Secondary | ICD-10-CM

## 2016-01-09 ENCOUNTER — Telehealth: Payer: Self-pay | Admitting: Internal Medicine

## 2016-01-09 NOTE — Telephone Encounter (Signed)
Allergy Serum Extract Date Mixed: 01/09/16 Vial: 2 Strength: 1:10 Here/Mail/Pick Up: here Mixed By: tbs Last OV: 01/18/13 Pending OV: N/A

## 2016-01-14 ENCOUNTER — Ambulatory Visit (INDEPENDENT_AMBULATORY_CARE_PROVIDER_SITE_OTHER): Payer: 59 | Admitting: *Deleted

## 2016-01-14 DIAGNOSIS — J309 Allergic rhinitis, unspecified: Secondary | ICD-10-CM | POA: Diagnosis not present

## 2016-01-21 ENCOUNTER — Ambulatory Visit (INDEPENDENT_AMBULATORY_CARE_PROVIDER_SITE_OTHER): Payer: 59 | Admitting: *Deleted

## 2016-01-21 DIAGNOSIS — J309 Allergic rhinitis, unspecified: Secondary | ICD-10-CM | POA: Diagnosis not present

## 2016-02-04 ENCOUNTER — Ambulatory Visit: Payer: 59

## 2016-02-04 ENCOUNTER — Ambulatory Visit (INDEPENDENT_AMBULATORY_CARE_PROVIDER_SITE_OTHER): Payer: 59 | Admitting: *Deleted

## 2016-02-04 DIAGNOSIS — J309 Allergic rhinitis, unspecified: Secondary | ICD-10-CM

## 2016-02-11 ENCOUNTER — Ambulatory Visit (INDEPENDENT_AMBULATORY_CARE_PROVIDER_SITE_OTHER): Payer: 59 | Admitting: *Deleted

## 2016-02-11 DIAGNOSIS — J309 Allergic rhinitis, unspecified: Secondary | ICD-10-CM | POA: Diagnosis not present

## 2016-02-18 ENCOUNTER — Ambulatory Visit (INDEPENDENT_AMBULATORY_CARE_PROVIDER_SITE_OTHER): Payer: 59 | Admitting: *Deleted

## 2016-02-18 DIAGNOSIS — J309 Allergic rhinitis, unspecified: Secondary | ICD-10-CM

## 2016-02-25 ENCOUNTER — Ambulatory Visit (INDEPENDENT_AMBULATORY_CARE_PROVIDER_SITE_OTHER): Payer: 59 | Admitting: *Deleted

## 2016-02-25 DIAGNOSIS — J309 Allergic rhinitis, unspecified: Secondary | ICD-10-CM

## 2016-02-26 DIAGNOSIS — L821 Other seborrheic keratosis: Secondary | ICD-10-CM | POA: Diagnosis not present

## 2016-02-26 DIAGNOSIS — D1801 Hemangioma of skin and subcutaneous tissue: Secondary | ICD-10-CM | POA: Diagnosis not present

## 2016-02-26 DIAGNOSIS — L570 Actinic keratosis: Secondary | ICD-10-CM | POA: Diagnosis not present

## 2016-03-03 ENCOUNTER — Ambulatory Visit (INDEPENDENT_AMBULATORY_CARE_PROVIDER_SITE_OTHER): Payer: 59 | Admitting: *Deleted

## 2016-03-03 DIAGNOSIS — J309 Allergic rhinitis, unspecified: Secondary | ICD-10-CM | POA: Diagnosis not present

## 2016-03-10 ENCOUNTER — Ambulatory Visit: Payer: 59

## 2016-03-17 ENCOUNTER — Ambulatory Visit (INDEPENDENT_AMBULATORY_CARE_PROVIDER_SITE_OTHER): Payer: 59 | Admitting: *Deleted

## 2016-03-17 DIAGNOSIS — J309 Allergic rhinitis, unspecified: Secondary | ICD-10-CM | POA: Diagnosis not present

## 2016-03-24 ENCOUNTER — Ambulatory Visit (INDEPENDENT_AMBULATORY_CARE_PROVIDER_SITE_OTHER): Payer: 59

## 2016-03-24 DIAGNOSIS — J309 Allergic rhinitis, unspecified: Secondary | ICD-10-CM

## 2016-03-27 DIAGNOSIS — H10413 Chronic giant papillary conjunctivitis, bilateral: Secondary | ICD-10-CM | POA: Diagnosis not present

## 2016-03-27 DIAGNOSIS — H02105 Unspecified ectropion of left lower eyelid: Secondary | ICD-10-CM | POA: Diagnosis not present

## 2016-03-27 DIAGNOSIS — H02102 Unspecified ectropion of right lower eyelid: Secondary | ICD-10-CM | POA: Diagnosis not present

## 2016-03-31 ENCOUNTER — Ambulatory Visit (INDEPENDENT_AMBULATORY_CARE_PROVIDER_SITE_OTHER): Payer: 59 | Admitting: *Deleted

## 2016-03-31 DIAGNOSIS — J309 Allergic rhinitis, unspecified: Secondary | ICD-10-CM | POA: Diagnosis not present

## 2016-04-06 ENCOUNTER — Ambulatory Visit (INDEPENDENT_AMBULATORY_CARE_PROVIDER_SITE_OTHER): Payer: 59 | Admitting: *Deleted

## 2016-04-06 DIAGNOSIS — J309 Allergic rhinitis, unspecified: Secondary | ICD-10-CM | POA: Diagnosis not present

## 2016-04-07 ENCOUNTER — Ambulatory Visit: Payer: 59

## 2016-04-10 DIAGNOSIS — B309 Viral conjunctivitis, unspecified: Secondary | ICD-10-CM | POA: Diagnosis not present

## 2016-04-10 DIAGNOSIS — H2513 Age-related nuclear cataract, bilateral: Secondary | ICD-10-CM | POA: Diagnosis not present

## 2016-04-14 ENCOUNTER — Ambulatory Visit: Payer: 59

## 2016-04-15 ENCOUNTER — Ambulatory Visit (INDEPENDENT_AMBULATORY_CARE_PROVIDER_SITE_OTHER): Payer: 59 | Admitting: *Deleted

## 2016-04-15 DIAGNOSIS — J309 Allergic rhinitis, unspecified: Secondary | ICD-10-CM

## 2016-04-15 NOTE — Progress Notes (Signed)
Please disregard [phone]. Only charge for [phone].

## 2016-04-28 ENCOUNTER — Ambulatory Visit: Payer: Self-pay | Admitting: Allergy and Immunology

## 2016-04-28 ENCOUNTER — Ambulatory Visit: Payer: 59

## 2016-04-29 ENCOUNTER — Ambulatory Visit (INDEPENDENT_AMBULATORY_CARE_PROVIDER_SITE_OTHER): Payer: 59 | Admitting: Allergy and Immunology

## 2016-04-29 ENCOUNTER — Encounter: Payer: Self-pay | Admitting: Allergy and Immunology

## 2016-04-29 VITALS — BP 126/76 | HR 64 | Temp 98.0°F | Resp 20 | Ht 73.66 in | Wt 214.8 lb

## 2016-04-29 DIAGNOSIS — H2513 Age-related nuclear cataract, bilateral: Secondary | ICD-10-CM | POA: Diagnosis not present

## 2016-04-29 DIAGNOSIS — J3089 Other allergic rhinitis: Secondary | ICD-10-CM | POA: Diagnosis not present

## 2016-04-29 DIAGNOSIS — B309 Viral conjunctivitis, unspecified: Secondary | ICD-10-CM | POA: Diagnosis not present

## 2016-04-29 NOTE — Progress Notes (Signed)
Dear Dr. Mare Ferrari,  Thank you for referring Harry Goltz MD to the [facility] on 04/29/2016.   Below is a summation of this patient's evaluation and recommendations.  Thank you for your referral. I will keep you informed about this patient's response to treatment.   If you have any questions please do not hesitate to contact me.   Sincerely,  Jiles Prows, MD [place]   ______________________________________________________________________    NEW PATIENT NOTE  Referring Provider: Darlin Coco, MD Primary Provider: Warren Danes, MD Date of office visit: 04/29/2016    Subjective:   Chief Complaint:  Harry Goltz MD (DOB: 09/30/1940) is a 76 y.o. male who presents to the clinic on 04/29/2016 with a chief complaint of Allergic Rhinitis  .     HPI: Harry [name] is  a long-standing patient of Dr. Keturah Barre and has been receiving immunotherapy for 25 years or so in the treatment of nasal and eye atopic disease. Apparently his last skin testing was 2 years ago and he had reformulation of his extract at that point in time. He occasionally does develop some issues with his eyes during the spring but for the most part most of his eyes and nasal issue is under good control while using immunotherapy. Presently he is using immunotherapy every week. Occasionally he'll take a fexofenadine. It did appear as though he required the administration of systemic steroids last spring while visiting [facility] for eye issues and may have actually developed some viral conjunctivitis with that episode.  In addition, Harry [name] has a history of yellow jacket allergy for which she was treated with immunotherapy for 3 years by Dr. Paulita Fujita. He now apparently has tolerance to venom as he been stung multiple times since that treatment without any problem.  He has a history of asthma that was  active during med school but has been inactive since that time period.   Past Medical History:  Diagnosis Date  . Allergic rhinitis   . Hyperlipidemia     Past Surgical History:  Procedure Laterality Date  . abdominal aorta ultrasound  07/22/2009   mild atherosclerotic changes without aneurysm  . HAND SURGERY  age 35   for infection  . NASAL SINUS SURGERY    . NM MYOVIEW LTD  03/19/2009   normal/ EF- 68%    Allergies as of 04/29/2016      Reactions   Bee Venom Other (See Comments)   unknown      Medication List      EPIPEN 2-PAK 0.3 mg/0.3 mL Soaj injection Generic drug:  EPINEPHrine Use as directed for life-threatening allergic reaction.   fexofenadine 180 MG tablet Commonly known as:  ALLEGRA Take 180 mg by mouth.   montelukast 10 MG tablet Commonly known as:  SINGULAIR Take 1 tablet (10 mg total) by mouth at bedtime.   MULTIVITAMIN ADULTS PO Take by mouth.   NONFORMULARY OR COMPOUNDED ITEM Allergy Vaccine 1:10 Given at [facility]       Review of systems negative except as noted in HPI / PMHx or noted below:  Review of Systems  Constitutional: Negative.   HENT: Negative.   Eyes: Negative.   Respiratory: Negative.   Cardiovascular: Negative.   Gastrointestinal: Negative.   Genitourinary: Negative.   Musculoskeletal: Negative.   Skin: Negative.   Neurological: Negative.   Endo/Heme/Allergies: Negative.   Psychiatric/Behavioral: Negative.  Family History  Problem Relation Age of Onset  . Aortic aneurysm Father 63  . Lung cancer Mother 36  . Allergic rhinitis      GM  . Hypertension Brother   . Heart attack Neg Hx   . Stroke Neg Hx     Social History   Social History  . Marital status: Married    Spouse name: N/A  . Number of children: N/A  . Years of education: N/A   Occupational History  . ENT surgeon    Social History Main Topics  . Smoking status: Former Smoker    Types: Pipe  . Smokeless tobacco: Never Used  .  Alcohol use Not on file  . Drug use: No  . Sexual activity: Not on file   Other Topics Concern  . Not on file   Social History Narrative  . No narrative on file    Environmental and Social history  Lives in a house with a dry environment, a dog located inside the household, wooden carpet in the bedroom, no plastic on the bed but plastic on the pillow, and no smoking ongoing inside the household. He is a ENT doctor.  Objective:   Vitals:   04/29/16 0844  BP: 126/76  Pulse: 64  Resp: 20  Temp: 98 F (36.7 C)   Height: 6' 1.66" (187.1 cm) Weight: 214 lb 12.8 oz (97.4 kg)  Physical Exam  Constitutional: He is well-developed, well-nourished, and in no distress.  HENT:  Head: Normocephalic.  Right Ear: Tympanic membrane and ear canal normal.  Left Ear: Tympanic membrane, external ear and ear canal normal.  Nose: Nose normal. No mucosal edema or rhinorrhea.  Mouth/Throat: Uvula is midline, oropharynx is clear and moist and mucous membranes are normal. No oropharyngeal exudate.  External auditory canal osteomas right  Eyes: Conjunctivae are normal.  Neck: Trachea normal. No tracheal tenderness present. No tracheal deviation present.  Cardiovascular: Normal rate, regular rhythm, S1 normal, S2 normal and normal heart sounds.   No murmur heard. Pulmonary/Chest: Breath sounds normal. No stridor. No respiratory distress. He has no wheezes. He has no rales.  Lymphadenopathy:       Head (right side): No tonsillar adenopathy present.       Head (left side): No tonsillar adenopathy present.  Neurological: He is alert.  Skin: No rash noted. He is not diaphoretic. No erythema. Nails show no clubbing.  Psychiatric: Mood and affect normal.    Diagnostics: Allergy skin tests were not performed.  Assessment and Plan:    1. Other allergic rhinitis     1. Continue immunotherapy and EpiPen  2. Return to clinic in 1 year or earlier if problem  Harry [name] would like to continue on  immunotherapy and we will have him continue to receive this form of treatment as formulated by Dr. Keturah Barre in our clinic. Anticipating closure of the allergy extract lab at Dr. Candiss Norse office we will be remixing his immunotherapy extract based upon skin test results and previous formulation at some point in the future. I will see him back in this clinic in 1 year or earlier if there is a problem.  Jiles Prows, MD [facility]

## 2016-04-29 NOTE — Patient Instructions (Signed)
  1.  Continue immunotherapy (and EpiPen) ° °2.  Return to clinic in 1 year or earlier if problem °

## 2016-05-05 ENCOUNTER — Ambulatory Visit: Payer: 59

## 2016-05-05 ENCOUNTER — Ambulatory Visit (INDEPENDENT_AMBULATORY_CARE_PROVIDER_SITE_OTHER): Payer: 59 | Admitting: *Deleted

## 2016-05-05 DIAGNOSIS — J309 Allergic rhinitis, unspecified: Secondary | ICD-10-CM

## 2016-05-12 ENCOUNTER — Ambulatory Visit (INDEPENDENT_AMBULATORY_CARE_PROVIDER_SITE_OTHER): Payer: 59 | Admitting: *Deleted

## 2016-05-12 ENCOUNTER — Ambulatory Visit: Payer: 59

## 2016-05-12 DIAGNOSIS — J309 Allergic rhinitis, unspecified: Secondary | ICD-10-CM

## 2016-05-19 ENCOUNTER — Ambulatory Visit (INDEPENDENT_AMBULATORY_CARE_PROVIDER_SITE_OTHER): Payer: 59

## 2016-05-19 ENCOUNTER — Ambulatory Visit: Payer: 59

## 2016-05-19 DIAGNOSIS — J309 Allergic rhinitis, unspecified: Secondary | ICD-10-CM | POA: Diagnosis not present

## 2016-05-26 ENCOUNTER — Ambulatory Visit (INDEPENDENT_AMBULATORY_CARE_PROVIDER_SITE_OTHER): Payer: 59 | Admitting: Allergy and Immunology

## 2016-05-26 ENCOUNTER — Encounter: Payer: Self-pay | Admitting: Allergy and Immunology

## 2016-05-26 VITALS — BP 120/72 | HR 68 | Resp 16

## 2016-05-26 DIAGNOSIS — J3089 Other allergic rhinitis: Secondary | ICD-10-CM | POA: Diagnosis not present

## 2016-05-26 NOTE — Patient Instructions (Signed)
  1. Continue immunotherapy and EpiPen  2. Can try combination of the following for spring allergies:   A. montelukast 10 mg daily  B. Pazeo one drop each eye one time per day  C. Zyrtec 10 mg tablet 1 time a day  2. Return to clinic in 1 year or earlier if problem

## 2016-05-26 NOTE — Progress Notes (Signed)
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 Allergy skin testing was performed and he demonstrated hypersensitivity against trees, weeds including ragweed, house dust mite, and cat. He'll be starting immunotherapy with a extract mixed from these current skin test results. As well, he can use montelukast and Pazeo for this upcoming spring time as his eyes usually do develop a significant problem around this point in time.

## 2016-05-27 DIAGNOSIS — J301 Allergic rhinitis due to pollen: Secondary | ICD-10-CM | POA: Diagnosis not present

## 2016-05-28 DIAGNOSIS — J3089 Other allergic rhinitis: Secondary | ICD-10-CM | POA: Diagnosis not present

## 2016-06-02 ENCOUNTER — Ambulatory Visit (INDEPENDENT_AMBULATORY_CARE_PROVIDER_SITE_OTHER): Payer: 59 | Admitting: *Deleted

## 2016-06-02 DIAGNOSIS — J309 Allergic rhinitis, unspecified: Secondary | ICD-10-CM

## 2016-06-03 NOTE — Progress Notes (Signed)
Immunotherapy   Patient Details  Name: Harry Sawtelle MD MRN: [id number] Date of Birth: 03-Sep-1940  06/03/2016  Harry Goltz MD started injections for  Tree-Cat & Mite-Weed Following schedule: B  Frequency:2 times per week Epi-Pen:Epi-Pen Available  Consent signed and patient instructions given. No problems noted.    Harry [name] 06/03/2016, 8:58 AM

## 2016-06-05 ENCOUNTER — Ambulatory Visit (INDEPENDENT_AMBULATORY_CARE_PROVIDER_SITE_OTHER): Payer: 59

## 2016-06-05 DIAGNOSIS — J309 Allergic rhinitis, unspecified: Secondary | ICD-10-CM

## 2016-06-08 ENCOUNTER — Ambulatory Visit (INDEPENDENT_AMBULATORY_CARE_PROVIDER_SITE_OTHER): Payer: 59 | Admitting: Gastroenterology

## 2016-06-08 ENCOUNTER — Other Ambulatory Visit (INDEPENDENT_AMBULATORY_CARE_PROVIDER_SITE_OTHER): Payer: 59

## 2016-06-08 ENCOUNTER — Other Ambulatory Visit: Payer: Self-pay

## 2016-06-08 ENCOUNTER — Encounter: Payer: Self-pay | Admitting: Gastroenterology

## 2016-06-08 ENCOUNTER — Ambulatory Visit: Payer: 59 | Admitting: Gastroenterology

## 2016-06-08 ENCOUNTER — Telehealth: Payer: Self-pay

## 2016-06-08 VITALS — BP 126/66 | HR 60 | Ht 73.6 in | Wt 211.1 lb

## 2016-06-08 DIAGNOSIS — R110 Nausea: Secondary | ICD-10-CM

## 2016-06-08 DIAGNOSIS — A09 Infectious gastroenteritis and colitis, unspecified: Secondary | ICD-10-CM | POA: Diagnosis not present

## 2016-06-08 DIAGNOSIS — R197 Diarrhea, unspecified: Secondary | ICD-10-CM

## 2016-06-08 LAB — COMPREHENSIVE METABOLIC PANEL
ALT: 21 U/L (ref 0–53)
AST: 22 U/L (ref 0–37)
Albumin: 4.2 g/dL (ref 3.5–5.2)
Alkaline Phosphatase: 59 U/L (ref 39–117)
BUN: 20 mg/dL (ref 6–23)
CO2: 26 mEq/L (ref 19–32)
Calcium: 9.1 mg/dL (ref 8.4–10.5)
Chloride: 103 mEq/L (ref 96–112)
Creatinine, Ser: 0.93 mg/dL (ref 0.40–1.50)
GFR: 83.92 mL/min (ref >60.00)
Glucose, Bld: 130 mg/dL — ABNORMAL HIGH (ref 70–99)
Potassium: 4.2 mEq/L (ref 3.5–5.1)
Sodium: 139 mEq/L (ref 135–145)
Total Bilirubin: 0.8 mg/dL (ref 0.2–1.2)
Total Protein: 6.9 g/dL (ref 6.0–8.3)

## 2016-06-08 LAB — CBC WITH DIFFERENTIAL/PLATELET
Basophils Absolute: 0 10*3/uL (ref 0.0–0.1)
Basophils Relative: 0.2 % (ref 0.0–3.0)
Eosinophils Absolute: 0.1 10*3/uL (ref 0.0–0.7)
Eosinophils Relative: 2 % (ref 0.0–5.0)
HCT: 45.6 % (ref 39.0–52.0)
Hemoglobin: 15.5 g/dL (ref 13.0–17.0)
Lymphocytes Relative: 16.4 % (ref 12.0–46.0)
Lymphs Abs: 1 10*3/uL (ref 0.7–4.0)
MCHC: 33.9 g/dL (ref 30.0–36.0)
MCV: 94.1 fl (ref 78.0–100.0)
Monocytes Absolute: 0.4 10*3/uL (ref 0.1–1.0)
Monocytes Relative: 7.1 % (ref 3.0–12.0)
Neutro Abs: 4.7 10*3/uL (ref 1.4–7.7)
Neutrophils Relative %: 74.3 % (ref 43.0–77.0)
Platelets: 196 10*3/uL (ref 150.0–400.0)
RBC: 4.84 Mil/uL (ref 4.22–5.81)
RDW: 12.3 % (ref 11.5–15.5)
WBC: 6.3 10*3/uL (ref 4.0–10.5)

## 2016-06-08 MED ORDER — METRONIDAZOLE 500 MG PO TABS: 500.0000 mg | ORAL_TABLET | Freq: Two times a day (BID) | ORAL | 0 refills | Status: DC

## 2016-06-08 MED ORDER — CIPROFLOXACIN HCL 500 MG PO TABS: 500.0000 mg | ORAL_TABLET | Freq: Two times a day (BID) | ORAL | 0 refills | Status: DC

## 2016-06-08 NOTE — Progress Notes (Signed)
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 Patient states he ate an undercooked hamburger about 2 weeks ago and he has had a significant change in his diet the past few weeks eating salads more regularly to attempt to drop weight. For the past few days he has noted intermittent nausea and looser, foul smelling stools. He states he was treated with antibiotics for a sinus infection in the fall on 2017 but no antibiotics since. No recent travel history. No known recent contacts with similar symptoms. CBC, CMP today showed an elevated glucose at 130 otherwise negative. Denies weight loss, abdominal pain, constipation, change in stool caliber, melena, hematochezia, vomiting, dysphagia, reflux symptoms, chest pain.  Colonoscopy 06/2010:  1) No polyps or cancers  2) Otherwise normal examination, adequate prep Colonoscopy 08/2004:   Mild diverticulosis, fair prep.   Allergies  Allergen Reactions  . Bee Venom Other (See Comments)    unknown   Outpatient Medications Prior to Visit  Medication Sig Dispense Refill  . EPINEPHrine (EPIPEN 2-PAK) 0.3 mg/0.3 mL IJ SOAJ injection Use as directed for life-threatening allergic reaction.    . fexofenadine (ALLEGRA) 180 MG tablet Take 180 mg by mouth.    . montelukast (SINGULAIR) 10 MG tablet Take 1 tablet (10 mg total) by mouth at bedtime. 30 tablet 11  . Multiple Vitamins-Minerals (MULTIVITAMIN ADULTS PO) Take by mouth.    . NONFORMULARY OR COMPOUNDED ITEM Allergy Vaccine 1:10 Given at [facility]     No facility-administered medications prior to visit.    Past Medical History:  Diagnosis Date  . Allergic rhinitis   . Hyperlipidemia    Past Surgical History:  Procedure Laterality Date  . abdominal aorta ultrasound  07/22/2009   mild atherosclerotic changes without aneurysm  . HAND SURGERY  age 88   for infection  . NASAL SINUS SURGERY    . NM MYOVIEW LTD   03/19/2009   normal/ EF- 68%   Social History   Social History  . Marital status: Married    Spouse name: N/A  . Number of children: N/A  . Years of education: N/A   Occupational History  . ENT surgeon    Social History Main Topics  . Smoking status: Former Smoker    Types: Pipe  . Smokeless tobacco: Never Used  . Alcohol use None  . Drug use: No  . Sexual activity: Not Asked   Other Topics Concern  . None   Social History Narrative  . None   Family History  Problem Relation Age of Onset  . Aortic aneurysm Father 23  . Lung cancer Mother 42  . Allergic rhinitis      GM  . Hypertension Brother   . Heart attack Neg Hx   . Stroke Neg Hx       Review of Systems: Pertinent positive and negative review of systems were noted in the above HPI section. All other review of systems were otherwise negative.   Physical Exam: General: Well developed, well nourished, no acute distress Head: Normocephalic and atraumatic Eyes:  sclerae anicteric, EOMI Ears: Normal auditory acuity Mouth: No deformity or lesions Neck: Supple, no masses or thyromegaly Lungs: Clear throughout to auscultation Heart: Regular rate and rhythm; no murmurs, rubs or bruits Abdomen: Soft, non tender and non distended. No masses, hepatosplenomegaly or hernias noted. Normal Bowel sounds Rectal: not done Musculoskeletal: Symmetrical with no gross deformities  Skin: No  lesions on visible extremities Pulses:  Normal pulses noted Extremities: No clubbing, cyanosis, edema or deformities noted Neurological: Alert oriented x 4, grossly nonfocal Cervical Nodes:  No significant cervical adenopathy Inguinal Nodes: No significant inguinal adenopathy Psychological:  Alert and cooperative. Normal mood and affect  Assessment and Recommendations:  1. New onset diarrhea and nausea. Suspected infectious diarrhea. Stool GI pathogen panel has been sent. Begin Cipro 500 mg twice a day for 10 days and Flagyl 500 mg  twice a day for 10 days empirically. Avoid dairy products, high fat foods, raw fruits and raw vegetables until GI symptoms have resolved. Call if symptoms not substantially improved in the next 3-5 days to plan additional evaluation.

## 2016-06-08 NOTE — Telephone Encounter (Signed)
Previous pt of Dr. Sharlett Iles, Dr. Ernesto Rutherford came into the office this morning at 7:55am asking to be seen this am before 8:45am. He is c/o nausea for the past week. Discussed with him that there were no available appts until later this afternoon. He states he ate a hamburger last week and thinks he may have picked up something from the burger. He requests to at least have some labs done. Order entered for CBC, CMET, and GI pathogen panel. Offered pt an appt with a midlevel at 3pm but he would really like to see or at least speak to a physician. Dr. Fuller Plan as DOD please advise. There is an appt on your schedule this afternoon at 3:45pm. Contact number for Dr. Ernesto Rutherford [phone].

## 2016-06-08 NOTE — Telephone Encounter (Signed)
Please add on him on at 3:45

## 2016-06-08 NOTE — Patient Instructions (Signed)
We have sent the following medications to your pharmacy for you to pick up at your convenience: Cipro and Flagyl 500 mg to take one tablet by mouth twice daily x 10 days.   Please refrain from eating salads, raw fruits and vegetables, dairy products, and stay on a low fat diet until your GI symptoms improve.   Call our office back if your symptoms do not improve.   Thank you for choosing me and [facility].  Pricilla Riffle. Dagoberto Ligas., MD., [facility] Regal

## 2016-06-08 NOTE — Telephone Encounter (Signed)
Spoke with pt and he is aware of appt 

## 2016-06-09 ENCOUNTER — Ambulatory Visit (INDEPENDENT_AMBULATORY_CARE_PROVIDER_SITE_OTHER): Payer: 59

## 2016-06-09 DIAGNOSIS — J309 Allergic rhinitis, unspecified: Secondary | ICD-10-CM

## 2016-06-10 ENCOUNTER — Telehealth: Payer: Self-pay | Admitting: Gastroenterology

## 2016-06-10 ENCOUNTER — Encounter: Payer: Self-pay | Admitting: Gastroenterology

## 2016-06-10 ENCOUNTER — Ambulatory Visit: Payer: 59 | Admitting: Allergy and Immunology

## 2016-06-10 NOTE — Telephone Encounter (Signed)
Discussed with the patient that he had nausea and "spacey" feeling after taking flagyl.  We discussed that he should take flagyl with a full meal and avoid all alcohol. He was not aware that he needed to avoid alcohol and had a drink last evening.  Notes faxed to number as he requested

## 2016-06-11 ENCOUNTER — Telehealth: Payer: Self-pay | Admitting: Internal Medicine

## 2016-06-11 ENCOUNTER — Ambulatory Visit (INDEPENDENT_AMBULATORY_CARE_PROVIDER_SITE_OTHER): Payer: 59 | Admitting: *Deleted

## 2016-06-11 DIAGNOSIS — J309 Allergic rhinitis, unspecified: Secondary | ICD-10-CM | POA: Diagnosis not present

## 2016-06-11 LAB — GASTROINTESTINAL PATHOGEN PANEL PCR
C. difficile Tox A/B, PCR: NOT DETECTED
Campylobacter, PCR: NOT DETECTED
Cryptosporidium, PCR: NOT DETECTED
E coli (ETEC) LT/ST PCR: NOT DETECTED
E coli (STEC) stx1/stx2, PCR: NOT DETECTED
E coli 0157, PCR: NOT DETECTED
Giardia lamblia, PCR: DETECTED — CR
Norovirus, PCR: NOT DETECTED
Rotavirus A, PCR: NOT DETECTED
Salmonella, PCR: NOT DETECTED
Shigella, PCR: NOT DETECTED

## 2016-06-11 NOTE — Telephone Encounter (Signed)
Contacted by the lab with positive GI pathogen panel Positive for Giardia Will forward to Dr. Fuller Plan for treatment plan Patient can be notified tomorrow

## 2016-06-12 NOTE — Telephone Encounter (Signed)
Discussed with Dr. Ardis Hughs, MD of the day.  Patient can stop Cipro and continue flagyl for another 5 days.  Patient notified of the results and new medication orders. He will call back if his symptoms fail to improve

## 2016-06-16 ENCOUNTER — Ambulatory Visit (INDEPENDENT_AMBULATORY_CARE_PROVIDER_SITE_OTHER): Payer: 59

## 2016-06-16 ENCOUNTER — Telehealth: Payer: Self-pay | Admitting: *Deleted

## 2016-06-16 DIAGNOSIS — J309 Allergic rhinitis, unspecified: Secondary | ICD-10-CM | POA: Diagnosis not present

## 2016-06-16 NOTE — Telephone Encounter (Signed)
Patient would like food RAST testing. He would like a broad spectrum of foods. Please advise. He would like to include gluten.

## 2016-06-22 ENCOUNTER — Telehealth: Payer: Self-pay | Admitting: Allergy and Immunology

## 2016-06-22 ENCOUNTER — Ambulatory Visit (INDEPENDENT_AMBULATORY_CARE_PROVIDER_SITE_OTHER): Payer: 59

## 2016-06-22 DIAGNOSIS — J309 Allergic rhinitis, unspecified: Secondary | ICD-10-CM

## 2016-06-22 DIAGNOSIS — Z8249 Family history of ischemic heart disease and other diseases of the circulatory system: Secondary | ICD-10-CM | POA: Diagnosis not present

## 2016-06-22 DIAGNOSIS — J3089 Other allergic rhinitis: Secondary | ICD-10-CM | POA: Diagnosis not present

## 2016-06-22 DIAGNOSIS — Z23 Encounter for immunization: Secondary | ICD-10-CM | POA: Diagnosis not present

## 2016-06-22 DIAGNOSIS — T7800XA Anaphylactic reaction due to unspecified food, initial encounter: Secondary | ICD-10-CM

## 2016-06-22 DIAGNOSIS — I700 Atherosclerosis of aorta: Secondary | ICD-10-CM | POA: Diagnosis not present

## 2016-06-22 DIAGNOSIS — R7301 Impaired fasting glucose: Secondary | ICD-10-CM | POA: Diagnosis not present

## 2016-06-22 DIAGNOSIS — E784 Other hyperlipidemia: Secondary | ICD-10-CM | POA: Diagnosis not present

## 2016-06-22 DIAGNOSIS — Z1389 Encounter for screening for other disorder: Secondary | ICD-10-CM | POA: Diagnosis not present

## 2016-06-22 DIAGNOSIS — Z6826 Body mass index (BMI) 26.0-26.9, adult: Secondary | ICD-10-CM | POA: Diagnosis not present

## 2016-06-22 NOTE — Telephone Encounter (Signed)
Please advise if you would like for him to schedule an appt or send him for labs?

## 2016-06-22 NOTE — Telephone Encounter (Signed)
Please ask him which foods does he want to be tested for and obtain those labs.

## 2016-06-22 NOTE — Telephone Encounter (Signed)
patient thinks he has a food allergy - does he need an appt for an evaluation - or can labs just be ordered for the patient?

## 2016-06-22 NOTE — Telephone Encounter (Signed)
Pt is mainly concerned with milk and wheat. He would rather have an appointment for skin testing. I informed him that the next opening is on 03/27 with just OV slots or I could get permission to double book if needed. He is going out of town on March 16th-25th. Please advise.

## 2016-06-23 NOTE — Telephone Encounter (Signed)
Orders are ready to be signed. They need a diagnosis code.  Thank you.

## 2016-06-23 NOTE — Telephone Encounter (Signed)
Lets first start with milk with reflex, wheat, and celiac screen w/IgA. Then based on these results can consider skin tests.

## 2016-06-24 DIAGNOSIS — T7800XA Anaphylactic reaction due to unspecified food, initial encounter: Secondary | ICD-10-CM | POA: Diagnosis not present

## 2016-06-24 NOTE — Telephone Encounter (Signed)
Was Dr. Ernesto Rutherford contacted about this lab request?

## 2016-06-25 ENCOUNTER — Telehealth: Payer: Self-pay | Admitting: Allergy and Immunology

## 2016-06-25 ENCOUNTER — Ambulatory Visit (HOSPITAL_COMMUNITY)
Admission: RE | Admit: 2016-06-25 | Discharge: 2016-06-25 | Disposition: A | Discharge status: Home / Self Care | Payer: 59 | Source: Ambulatory Visit | Attending: Vascular Surgery | Admitting: Vascular Surgery

## 2016-06-25 ENCOUNTER — Other Ambulatory Visit (HOSPITAL_COMMUNITY): Payer: Self-pay | Admitting: Internal Medicine

## 2016-06-25 ENCOUNTER — Ambulatory Visit (INDEPENDENT_AMBULATORY_CARE_PROVIDER_SITE_OTHER): Payer: 59 | Admitting: *Deleted

## 2016-06-25 DIAGNOSIS — J309 Allergic rhinitis, unspecified: Secondary | ICD-10-CM

## 2016-06-25 DIAGNOSIS — Z8249 Family history of ischemic heart disease and other diseases of the circulatory system: Secondary | ICD-10-CM | POA: Insufficient documentation

## 2016-06-25 DIAGNOSIS — I714 Abdominal aortic aneurysm, without rupture, unspecified: Secondary | ICD-10-CM

## 2016-06-25 LAB — ALLERGEN MILK: Milk IgE: <0.1 kU/L

## 2016-06-25 LAB — CELIAC DISEASE COMPREHENSIVE PANEL WITH REFLEXES
IgA: 243 mg/dL (ref 81–463)
Tissue Transglutaminase Ab, IgA: 1 U/mL (ref <4)

## 2016-06-25 LAB — ALLERGEN, WHEAT, F4: Wheat IgE: <0.1 kU/L

## 2016-06-25 LAB — GLIADIN ANTIBODIES, SERUM
Gliadin IgA: 5 Units (ref <20)
Gliadin IgG: 1 Units (ref <20)

## 2016-06-25 LAB — TISSUE TRANSGLUTAMINASE, IGG: Tissue Transglut Ab: 1 U/mL (ref <6)

## 2016-06-25 NOTE — Telephone Encounter (Signed)
Yes sir he was. I put the orders in. There is a copy of the requisitions for him to pick up as well.

## 2016-06-25 NOTE — Telephone Encounter (Signed)
[facility] called about Harry [name] refer # [id number].   [phone] option 2.

## 2016-06-25 NOTE — Telephone Encounter (Signed)
Celiac panel that was ordered was inactive so they had to reorder diff labs ok to run new lab codes

## 2016-06-26 LAB — ENDOMYSIAL AB IGA RFLX TITER: Endomysial Screen: NEGATIVE

## 2016-06-30 ENCOUNTER — Ambulatory Visit (INDEPENDENT_AMBULATORY_CARE_PROVIDER_SITE_OTHER): Payer: 59 | Admitting: *Deleted

## 2016-06-30 DIAGNOSIS — J309 Allergic rhinitis, unspecified: Secondary | ICD-10-CM

## 2016-07-02 ENCOUNTER — Other Ambulatory Visit (HOSPITAL_COMMUNITY): Payer: 59

## 2016-07-02 ENCOUNTER — Ambulatory Visit (INDEPENDENT_AMBULATORY_CARE_PROVIDER_SITE_OTHER): Payer: 59 | Admitting: *Deleted

## 2016-07-02 DIAGNOSIS — J309 Allergic rhinitis, unspecified: Secondary | ICD-10-CM | POA: Diagnosis not present

## 2016-07-15 ENCOUNTER — Ambulatory Visit (INDEPENDENT_AMBULATORY_CARE_PROVIDER_SITE_OTHER): Payer: 59

## 2016-07-15 DIAGNOSIS — J309 Allergic rhinitis, unspecified: Secondary | ICD-10-CM

## 2016-07-21 ENCOUNTER — Ambulatory Visit (INDEPENDENT_AMBULATORY_CARE_PROVIDER_SITE_OTHER): Payer: 59 | Admitting: *Deleted

## 2016-07-21 DIAGNOSIS — J309 Allergic rhinitis, unspecified: Secondary | ICD-10-CM

## 2016-07-23 ENCOUNTER — Ambulatory Visit (INDEPENDENT_AMBULATORY_CARE_PROVIDER_SITE_OTHER): Payer: 59 | Admitting: *Deleted

## 2016-07-23 DIAGNOSIS — J309 Allergic rhinitis, unspecified: Secondary | ICD-10-CM | POA: Diagnosis not present

## 2016-07-28 ENCOUNTER — Ambulatory Visit (INDEPENDENT_AMBULATORY_CARE_PROVIDER_SITE_OTHER): Payer: 59 | Admitting: *Deleted

## 2016-07-28 DIAGNOSIS — J309 Allergic rhinitis, unspecified: Secondary | ICD-10-CM | POA: Diagnosis not present

## 2016-07-30 ENCOUNTER — Ambulatory Visit (INDEPENDENT_AMBULATORY_CARE_PROVIDER_SITE_OTHER): Payer: 59 | Admitting: *Deleted

## 2016-07-30 DIAGNOSIS — J309 Allergic rhinitis, unspecified: Secondary | ICD-10-CM

## 2016-08-04 ENCOUNTER — Ambulatory Visit (INDEPENDENT_AMBULATORY_CARE_PROVIDER_SITE_OTHER): Payer: 59 | Admitting: *Deleted

## 2016-08-04 DIAGNOSIS — J309 Allergic rhinitis, unspecified: Secondary | ICD-10-CM

## 2016-08-06 ENCOUNTER — Ambulatory Visit (INDEPENDENT_AMBULATORY_CARE_PROVIDER_SITE_OTHER): Payer: 59

## 2016-08-06 DIAGNOSIS — J309 Allergic rhinitis, unspecified: Secondary | ICD-10-CM | POA: Diagnosis not present

## 2016-08-10 ENCOUNTER — Ambulatory Visit (INDEPENDENT_AMBULATORY_CARE_PROVIDER_SITE_OTHER): Payer: 59 | Admitting: *Deleted

## 2016-08-10 DIAGNOSIS — J309 Allergic rhinitis, unspecified: Secondary | ICD-10-CM

## 2016-08-13 ENCOUNTER — Ambulatory Visit (INDEPENDENT_AMBULATORY_CARE_PROVIDER_SITE_OTHER): Payer: 59

## 2016-08-13 DIAGNOSIS — J309 Allergic rhinitis, unspecified: Secondary | ICD-10-CM | POA: Diagnosis not present

## 2016-08-18 ENCOUNTER — Ambulatory Visit (INDEPENDENT_AMBULATORY_CARE_PROVIDER_SITE_OTHER): Payer: 59 | Admitting: *Deleted

## 2016-08-18 DIAGNOSIS — J309 Allergic rhinitis, unspecified: Secondary | ICD-10-CM

## 2016-08-24 ENCOUNTER — Ambulatory Visit (INDEPENDENT_AMBULATORY_CARE_PROVIDER_SITE_OTHER): Payer: 59

## 2016-08-24 DIAGNOSIS — J309 Allergic rhinitis, unspecified: Secondary | ICD-10-CM | POA: Diagnosis not present

## 2016-08-31 ENCOUNTER — Ambulatory Visit (INDEPENDENT_AMBULATORY_CARE_PROVIDER_SITE_OTHER): Payer: 59

## 2016-08-31 DIAGNOSIS — J309 Allergic rhinitis, unspecified: Secondary | ICD-10-CM | POA: Diagnosis not present

## 2016-09-09 ENCOUNTER — Ambulatory Visit (INDEPENDENT_AMBULATORY_CARE_PROVIDER_SITE_OTHER): Payer: 59 | Admitting: *Deleted

## 2016-09-09 DIAGNOSIS — J309 Allergic rhinitis, unspecified: Secondary | ICD-10-CM | POA: Diagnosis not present

## 2016-09-11 ENCOUNTER — Other Ambulatory Visit: Payer: Self-pay | Admitting: *Deleted

## 2016-09-11 DIAGNOSIS — R509 Fever, unspecified: Secondary | ICD-10-CM

## 2016-09-11 DIAGNOSIS — W57XXXS Bitten or stung by nonvenomous insect and other nonvenomous arthropods, sequela: Secondary | ICD-10-CM | POA: Diagnosis not present

## 2016-09-14 ENCOUNTER — Telehealth: Payer: Self-pay | Admitting: Internal Medicine

## 2016-09-14 NOTE — Telephone Encounter (Signed)
Patient sustained tick bite roughly 7-10 day: thus he was started on doxycycline and labs done at [facility] on 5/25. Called today to find out results,   RMSF still pending

## 2016-09-15 ENCOUNTER — Telehealth: Payer: Self-pay

## 2016-09-15 DIAGNOSIS — R195 Other fecal abnormalities: Secondary | ICD-10-CM

## 2016-09-15 DIAGNOSIS — R110 Nausea: Secondary | ICD-10-CM

## 2016-09-15 NOTE — Telephone Encounter (Signed)
Patient notified to come pick up containers

## 2016-09-15 NOTE — Telephone Encounter (Signed)
Patient called to report that he feels he still gas giardia.  He is having nausea and had "some diarrhea" last week.  He is on doxycycline currently for a sinus infection.  He doesn't feel the nausea is due to the antibiotics.  He is asking if he can have stool studies repeated.  Please advise

## 2016-09-15 NOTE — Telephone Encounter (Signed)
Yes, obtain a GI pathogen panel

## 2016-09-16 ENCOUNTER — Ambulatory Visit (INDEPENDENT_AMBULATORY_CARE_PROVIDER_SITE_OTHER): Payer: 59

## 2016-09-16 DIAGNOSIS — J309 Allergic rhinitis, unspecified: Secondary | ICD-10-CM | POA: Diagnosis not present

## 2016-09-17 ENCOUNTER — Other Ambulatory Visit: Payer: 59

## 2016-09-17 DIAGNOSIS — R195 Other fecal abnormalities: Secondary | ICD-10-CM | POA: Diagnosis not present

## 2016-09-17 DIAGNOSIS — R110 Nausea: Secondary | ICD-10-CM | POA: Diagnosis not present

## 2016-09-21 LAB — GASTROINTESTINAL PATHOGEN PANEL PCR
C. difficile Tox A/B, PCR: NOT DETECTED
Campylobacter, PCR: NOT DETECTED
Cryptosporidium, PCR: NOT DETECTED
E coli (ETEC) LT/ST PCR: NOT DETECTED
E coli (STEC) stx1/stx2, PCR: NOT DETECTED
E coli 0157, PCR: NOT DETECTED
Giardia lamblia, PCR: NOT DETECTED
Norovirus, PCR: NOT DETECTED
Rotavirus A, PCR: NOT DETECTED
Salmonella, PCR: NOT DETECTED
Shigella, PCR: NOT DETECTED

## 2016-09-22 ENCOUNTER — Telehealth: Payer: Self-pay | Admitting: Gastroenterology

## 2016-09-22 NOTE — Telephone Encounter (Signed)
Patient notified GI pathogen panel was negative.

## 2016-09-28 ENCOUNTER — Ambulatory Visit (INDEPENDENT_AMBULATORY_CARE_PROVIDER_SITE_OTHER): Payer: 59

## 2016-09-28 DIAGNOSIS — J309 Allergic rhinitis, unspecified: Secondary | ICD-10-CM | POA: Diagnosis not present

## 2016-09-30 NOTE — Progress Notes (Signed)
Vials to be made 10-02-16  jm

## 2016-10-01 DIAGNOSIS — J301 Allergic rhinitis due to pollen: Secondary | ICD-10-CM | POA: Diagnosis not present

## 2016-10-02 DIAGNOSIS — J3089 Other allergic rhinitis: Secondary | ICD-10-CM | POA: Diagnosis not present

## 2016-10-12 ENCOUNTER — Ambulatory Visit (INDEPENDENT_AMBULATORY_CARE_PROVIDER_SITE_OTHER): Payer: 59 | Admitting: *Deleted

## 2016-10-12 DIAGNOSIS — J309 Allergic rhinitis, unspecified: Secondary | ICD-10-CM | POA: Diagnosis not present

## 2016-11-02 ENCOUNTER — Ambulatory Visit (INDEPENDENT_AMBULATORY_CARE_PROVIDER_SITE_OTHER): Payer: 59 | Admitting: *Deleted

## 2016-11-02 DIAGNOSIS — J309 Allergic rhinitis, unspecified: Secondary | ICD-10-CM | POA: Diagnosis not present

## 2016-11-09 ENCOUNTER — Ambulatory Visit (INDEPENDENT_AMBULATORY_CARE_PROVIDER_SITE_OTHER): Payer: 59

## 2016-11-09 DIAGNOSIS — J309 Allergic rhinitis, unspecified: Secondary | ICD-10-CM | POA: Diagnosis not present

## 2016-12-01 ENCOUNTER — Ambulatory Visit (INDEPENDENT_AMBULATORY_CARE_PROVIDER_SITE_OTHER): Payer: 59 | Admitting: *Deleted

## 2016-12-01 DIAGNOSIS — J309 Allergic rhinitis, unspecified: Secondary | ICD-10-CM

## 2016-12-07 ENCOUNTER — Ambulatory Visit (INDEPENDENT_AMBULATORY_CARE_PROVIDER_SITE_OTHER): Payer: 59 | Admitting: *Deleted

## 2016-12-07 DIAGNOSIS — J309 Allergic rhinitis, unspecified: Secondary | ICD-10-CM | POA: Diagnosis not present

## 2016-12-22 ENCOUNTER — Ambulatory Visit (INDEPENDENT_AMBULATORY_CARE_PROVIDER_SITE_OTHER): Payer: 59

## 2016-12-22 DIAGNOSIS — J309 Allergic rhinitis, unspecified: Secondary | ICD-10-CM

## 2016-12-22 DIAGNOSIS — M5442 Lumbago with sciatica, left side: Secondary | ICD-10-CM | POA: Diagnosis not present

## 2016-12-22 DIAGNOSIS — S39012A Strain of muscle, fascia and tendon of lower back, initial encounter: Secondary | ICD-10-CM | POA: Diagnosis not present

## 2016-12-28 ENCOUNTER — Ambulatory Visit (INDEPENDENT_AMBULATORY_CARE_PROVIDER_SITE_OTHER): Payer: 59

## 2016-12-28 DIAGNOSIS — J309 Allergic rhinitis, unspecified: Secondary | ICD-10-CM | POA: Diagnosis not present

## 2017-01-01 DIAGNOSIS — R040 Epistaxis: Secondary | ICD-10-CM | POA: Diagnosis not present

## 2017-01-05 ENCOUNTER — Ambulatory Visit (INDEPENDENT_AMBULATORY_CARE_PROVIDER_SITE_OTHER): Payer: 59

## 2017-01-05 DIAGNOSIS — J309 Allergic rhinitis, unspecified: Secondary | ICD-10-CM

## 2017-01-05 DIAGNOSIS — R040 Epistaxis: Secondary | ICD-10-CM | POA: Diagnosis not present

## 2017-01-08 ENCOUNTER — Encounter (HOSPITAL_BASED_OUTPATIENT_CLINIC_OR_DEPARTMENT_OTHER)
Admission: RE | Disposition: A | Discharge status: Home / Self Care | Payer: Self-pay | Source: Ambulatory Visit | Attending: Otolaryngology

## 2017-01-08 ENCOUNTER — Encounter (HOSPITAL_BASED_OUTPATIENT_CLINIC_OR_DEPARTMENT_OTHER): Payer: Self-pay | Admitting: Anesthesiology

## 2017-01-08 ENCOUNTER — Ambulatory Visit (HOSPITAL_BASED_OUTPATIENT_CLINIC_OR_DEPARTMENT_OTHER)
Admission: RE | Admit: 2017-01-08 | Discharge: 2017-01-08 | Disposition: A | Discharge status: Home / Self Care | Payer: 59 | Source: Ambulatory Visit | Attending: Otolaryngology | Admitting: Otolaryngology

## 2017-01-08 ENCOUNTER — Encounter (HOSPITAL_BASED_OUTPATIENT_CLINIC_OR_DEPARTMENT_OTHER): Payer: Self-pay | Admitting: *Deleted

## 2017-01-08 DIAGNOSIS — Z91030 Bee allergy status: Secondary | ICD-10-CM | POA: Insufficient documentation

## 2017-01-08 DIAGNOSIS — Z87891 Personal history of nicotine dependence: Secondary | ICD-10-CM | POA: Insufficient documentation

## 2017-01-08 DIAGNOSIS — R040 Epistaxis: Secondary | ICD-10-CM | POA: Diagnosis not present

## 2017-01-08 DIAGNOSIS — Z79899 Other long term (current) drug therapy: Secondary | ICD-10-CM | POA: Insufficient documentation

## 2017-01-08 DIAGNOSIS — E785 Hyperlipidemia, unspecified: Secondary | ICD-10-CM | POA: Diagnosis not present

## 2017-01-08 HISTORY — PX: NASAL HEMORRHAGE CONTROL: SHX287

## 2017-01-08 SURGERY — CONTROL OF EPISTAXIS
Anesthesia: LOCAL | Site: Nose

## 2017-01-08 MED ORDER — MUPIROCIN CALCIUM 2 % EX CREA
TOPICAL_CREAM | CUTANEOUS | Status: DC | PRN
  Administered 2017-01-08: 1 via TOPICAL

## 2017-01-08 MED ORDER — COCAINE HCL 4 % EX SOLN
CUTANEOUS | Status: DC | PRN
  Administered 2017-01-08: 1 mL via TOPICAL

## 2017-01-08 MED ORDER — LIDOCAINE-EPINEPHRINE 1 %-1:100000 IJ SOLN
INTRAMUSCULAR | Status: DC | PRN
  Administered 2017-01-08: 7 mL

## 2017-01-08 MED ORDER — BACITRACIN ZINC 500 UNIT/GM EX OINT
TOPICAL_OINTMENT | CUTANEOUS | Status: AC
  Filled 2017-01-08: qty 28.35

## 2017-01-08 MED ORDER — SILVER NITRATE-POT NITRATE 75-25 % EX MISC
CUTANEOUS | Status: DC | PRN
  Administered 2017-01-08: 1

## 2017-01-08 MED ORDER — SILVER NITRATE-POT NITRATE 75-25 % EX MISC
CUTANEOUS | Status: AC
  Filled 2017-01-08: qty 3

## 2017-01-08 SURGICAL SUPPLY — 36 items
APPLICATOR COTTON TIP 6IN STRL (MISCELLANEOUS) ×2 IMPLANT
CANISTER SUCT 1200ML W/VALVE (MISCELLANEOUS) ×2 IMPLANT
COAGULATOR SUCT 8FR VV (MISCELLANEOUS) IMPLANT
CONT SPEC 4OZ CLIKSEAL STRL BL (MISCELLANEOUS) ×4 IMPLANT
CORD BIPOLAR FORCEPS 12FT (ELECTRODE) IMPLANT
COVER BACK TABLE 60X90IN (DRAPES) ×2 IMPLANT
DECANTER SPIKE VIAL GLASS SM (MISCELLANEOUS) ×2 IMPLANT
DEPRESSOR TONGUE BLADE STERILE (MISCELLANEOUS) IMPLANT
DRESSING NASAL POPE 10X1.5X2.5 (GAUZE/BANDAGES/DRESSINGS) IMPLANT
DRSG NASAL POPE 10X1.5X2.5 (GAUZE/BANDAGES/DRESSINGS)
DRSG TELFA 3X8 NADH (GAUZE/BANDAGES/DRESSINGS) IMPLANT
ELECT REM PT RETURN 9FT ADLT (ELECTROSURGICAL) ×2
ELECT REM PT RETURN 9FT PED (ELECTROSURGICAL)
ELECTRODE REM PT RETRN 9FT PED (ELECTROSURGICAL) IMPLANT
ELECTRODE REM PT RTRN 9FT ADLT (ELECTROSURGICAL) ×1 IMPLANT
GAUZE SPONGE 4X4 12PLY STRL LF (GAUZE/BANDAGES/DRESSINGS) ×2 IMPLANT
GLOVE BIOGEL PI IND STRL 7.0 (GLOVE) ×1 IMPLANT
GLOVE BIOGEL PI INDICATOR 7.0 (GLOVE) ×1
GLOVE SS BIOGEL STRL SZ 7.5 (GLOVE) ×1 IMPLANT
GLOVE SUPERSENSE BIOGEL SZ 7.5 (GLOVE) ×1
GOWN STRL REUS W/ TWL LRG LVL3 (GOWN DISPOSABLE) IMPLANT
GOWN STRL REUS W/ TWL XL LVL3 (GOWN DISPOSABLE) IMPLANT
GOWN STRL REUS W/TWL LRG LVL3 (GOWN DISPOSABLE)
GOWN STRL REUS W/TWL XL LVL3 (GOWN DISPOSABLE)
HEMOSTAT SURGICEL 2X14 (HEMOSTASIS) IMPLANT
MARKER SKIN DUAL TIP RULER LAB (MISCELLANEOUS) IMPLANT
NEEDLE PRECISIONGLIDE 27X1.5 (NEEDLE) ×2 IMPLANT
PACK BASIN DAY SURGERY FS (CUSTOM PROCEDURE TRAY) IMPLANT
PATTIES SURGICAL .5 X3 (DISPOSABLE) ×2 IMPLANT
SHEET MEDIUM DRAPE 40X70 STRL (DRAPES) ×2 IMPLANT
SOLUTION BUTLER CLEAR DIP (MISCELLANEOUS) IMPLANT
SPONGE GAUZE 2X2 8PLY STRL LF (GAUZE/BANDAGES/DRESSINGS) IMPLANT
SYR CONTROL 10ML LL (SYRINGE) IMPLANT
TOWEL OR 17X24 6PK STRL BLUE (TOWEL DISPOSABLE) ×4 IMPLANT
TUBE CONNECTING 20X1/4 (TUBING) ×2 IMPLANT
YANKAUER SUCT BULB TIP NO VENT (SUCTIONS) ×2 IMPLANT

## 2017-01-08 NOTE — H&P (Signed)
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 It was finally stopped with Silver nitrate and packing. He had recurrent bleeding that was again cauterized with silver nitrate and a merocel pack. He's continued to have intermittent bleediing and is taken to the OR for electrical cauterization of the recurrent epistaxis.   Past Medical History:  Diagnosis Date  . Allergic rhinitis   . Hyperlipidemia    Past Surgical History:  Procedure Laterality Date  . abdominal aorta ultrasound  07/22/2009   mild atherosclerotic changes without aneurysm  . HAND SURGERY  age 60   for infection  . NASAL SINUS SURGERY    . NM MYOVIEW LTD  03/19/2009   normal/ EF- 68%   Social History   Social History  . Marital status: Married    Spouse name: N/A  . Number of children: N/A  . Years of education: N/A   Occupational History  . ENT surgeon    Social History Main Topics  . Smoking status: Former Smoker    Types: Pipe  . Smokeless tobacco: Never Used  . Alcohol use Not on file  . Drug use: No  . Sexual activity: Not on file   Other Topics Concern  . Not on file   Social History Narrative  . No narrative on file   Family History  Problem Relation Age of Onset  . Aortic aneurysm Father 37  . Lung cancer Mother 72  . Allergic rhinitis Unknown        GM  . Hypertension Brother   . Heart attack Neg Hx   . Stroke Neg Hx    Allergies  Allergen Reactions  . Bee Venom Other (See Comments)    unknown   Prior to Admission medications   Medication Sig Start Date End Date Taking? Authorizing Provider  atorvastatin (LIPITOR) 10 MG tablet Take 10 mg by mouth every other day.    [provider]  ciprofloxacin (CIPRO) 500 MG tablet Take 1 tablet (500 mg total) by  mouth 2 (two) times daily. 06/08/16   Ladene Artist, MD  EPINEPHrine (EPIPEN 2-PAK) 0.3 mg/0.3 mL IJ SOAJ injection Use as directed for life-threatening allergic reaction.    [provider]  fexofenadine (ALLEGRA) 180 MG tablet Take 180 mg by mouth.    [provider]  metroNIDAZOLE (FLAGYL) 500 MG tablet Take 1 tablet (500 mg total) by mouth 2 (two) times daily. 06/08/16   Ladene Artist, MD  montelukast (SINGULAIR) 10 MG tablet Take 1 tablet (10 mg total) by mouth at bedtime. 08/20/15   Deneise Lever, MD  Multiple Vitamins-Minerals (MULTIVITAMIN ADULTS PO) Take by mouth.    [provider]  NONFORMULARY OR COMPOUNDED ITEM Allergy Vaccine 1:10 Given at [facility]    [provider]     Positive ROS: per HPI  All other systems have been reviewed and were otherwise negative with the exception of those mentioned in the HPI and as above.  Physical Exam: There were no vitals filed for this visit.  General: Alert, no acute distress Oral: Normal oral mucosa and tonsils Nasal: Bleeding from left anterior inferior septum and also Kiesselbach's plexus Neck: No palpable adenopathy or thyroid nodules Ear: Ear canal is clear with normal appearing TMs Cardiovascular: Regular rate  and rhythm, no murmur.  Respiratory: Clear to auscultation Neurologic: Alert and oriented x 3   Assessment/Plan: Nosebleed Plan for Procedure(s): MINOR EPISTAXIS CONTROL   Melony Overly, MD 01/08/2017 11:10 AM

## 2017-01-08 NOTE — Brief Op Note (Signed)
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  BLOOD ADMINISTERED:none  DRAINS: none   LOCAL MEDICATIONS USED:  LIDOCAINE with EPI 5 cc  SPECIMEN:  No Specimen  DISPOSITION OF SPECIMEN:  N/A  COUNTS:  YES  TOURNIQUET:  * No tourniquets in log *  DICTATION: .Other Dictation: Dictation Number [id number]  PLAN OF CARE: Discharge to home after PACU  PATIENT DISPOSITION:  PACU - hemodynamically stable.   Delay start of Pharmacological VTE agent (>24hrs) due to surgical blood loss or risk of bleeding: yes

## 2017-01-08 NOTE — Discharge Instructions (Signed)
Do not blow nose for the next 5 days. Contact Dr Lucia Gaskins if you have any further bleeding. Can apply topical antibiotic ointment to left nostril bid if desired.

## 2017-01-08 NOTE — Interval H&P Note (Signed)
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 After consideration of risks, benefits and other options for treatment, the patient has consented to  Procedure(s): MINOR EPISTAXIS CONTROL (N/A) as a surgical intervention .  The patient's history has been reviewed, patient examined, no change in status, stable for surgery.  I have reviewed the patient's chart and labs.  Questions were answered to the patient's satisfaction.     CHRISTOPHER NEWMAN

## 2017-01-11 ENCOUNTER — Encounter (HOSPITAL_BASED_OUTPATIENT_CLINIC_OR_DEPARTMENT_OTHER): Payer: Self-pay | Admitting: Otolaryngology

## 2017-01-11 ENCOUNTER — Ambulatory Visit (INDEPENDENT_AMBULATORY_CARE_PROVIDER_SITE_OTHER): Payer: 59 | Admitting: *Deleted

## 2017-01-11 DIAGNOSIS — J309 Allergic rhinitis, unspecified: Secondary | ICD-10-CM | POA: Diagnosis not present

## 2017-01-11 NOTE — Op Note (Signed)
NAME:  Harry [name], Harry [name]                   ACCOUNT NO.:  MEDICAL RECORD NO.:  [id number]  LOCATION:                                 FACILITY:  PHYSICIAN:  Leonides Sake. Lucia Gaskins, M.D. DATE OF BIRTH:  DATE OF PROCEDURE:  01/08/2017 DATE OF DISCHARGE:                              OPERATIVE REPORT   PREOPERATIVE DIAGNOSIS:  Recurrent left-sided epistaxis.  POSTOPERATIVE DIAGNOSIS:  Recurrent left-sided epistaxis.  OPERATION PERFORMED:  Nasal exam with cauterization of left-sided epistaxis.  SURGEON:  Leonides Sake. Lucia Gaskins, M.D.  ANESTHESIA:  Local, 1% Xylocaine with epinephrine, 5 mL.  COMPLICATIONS:  None.  BRIEF CLINICAL NOTE:  Claud Gowan is a 76 year old gentleman, who initially had a nosebleed that began about 4 days ago.  It was a fairly brisk bleeding from the anterior-inferior left side of the septum.  This was cauterized with silver nitrate.  He had recurrent episodes of epistaxis over the following few days that was re-cauterized and packed with Merocel.  He had further bleeding yesterday that was cauterized again with silver nitrate.  He had further bleeding this morning and is taken to the operating room at this time for cauterization with electrical suction cautery using local anesthetic.  DESCRIPTION OF PROCEDURE:  The patient was brought to the operating room.  The nose was injected with 4-5 mL of Xylocaine with epinephrine for local hemostasis as well as a cotton pledgets soaked in topical 4% cocaine was used for topical hemostasis and analgesia.  Then, using a suction cautery at 30 watts, suction cautery of the inferior septum on the left side was performed.  The area where the previous arterial bleeding that had been previously cauterized was cauterized with suction cautery.  Again, a small area of bleeding from the Kiesselbach's plexus, which was a little bit higher on the septum was also lightly cauterized with suction cautery as well as silver nitrate.   There was no further bleeding.  Bacitracin ointment was applied.  The patient was subsequently discharged home.  He will follow up in my office p.r.n. with any further bleeding.          ______________________________ Leonides Sake. Lucia Gaskins, M.D.     CEN/MEDQ  D:  01/08/2017  T:  01/08/2017  Job:  [id number]

## 2017-01-18 ENCOUNTER — Ambulatory Visit (INDEPENDENT_AMBULATORY_CARE_PROVIDER_SITE_OTHER): Payer: 59 | Admitting: *Deleted

## 2017-01-18 DIAGNOSIS — J309 Allergic rhinitis, unspecified: Secondary | ICD-10-CM | POA: Diagnosis not present

## 2017-02-04 ENCOUNTER — Ambulatory Visit (INDEPENDENT_AMBULATORY_CARE_PROVIDER_SITE_OTHER): Payer: 59 | Admitting: *Deleted

## 2017-02-04 DIAGNOSIS — J309 Allergic rhinitis, unspecified: Secondary | ICD-10-CM | POA: Diagnosis not present

## 2017-02-08 ENCOUNTER — Ambulatory Visit (INDEPENDENT_AMBULATORY_CARE_PROVIDER_SITE_OTHER): Payer: 59

## 2017-02-08 DIAGNOSIS — J309 Allergic rhinitis, unspecified: Secondary | ICD-10-CM

## 2017-02-11 DIAGNOSIS — R040 Epistaxis: Secondary | ICD-10-CM | POA: Diagnosis not present

## 2017-02-11 NOTE — Progress Notes (Signed)
VIALS EXP 02-12-18

## 2017-02-12 DIAGNOSIS — J301 Allergic rhinitis due to pollen: Secondary | ICD-10-CM | POA: Diagnosis not present

## 2017-02-15 ENCOUNTER — Ambulatory Visit (INDEPENDENT_AMBULATORY_CARE_PROVIDER_SITE_OTHER): Payer: 59 | Admitting: *Deleted

## 2017-02-15 DIAGNOSIS — J309 Allergic rhinitis, unspecified: Secondary | ICD-10-CM | POA: Diagnosis not present

## 2017-02-22 ENCOUNTER — Ambulatory Visit (INDEPENDENT_AMBULATORY_CARE_PROVIDER_SITE_OTHER): Payer: 59 | Admitting: *Deleted

## 2017-02-22 DIAGNOSIS — J309 Allergic rhinitis, unspecified: Secondary | ICD-10-CM

## 2017-03-02 ENCOUNTER — Ambulatory Visit (INDEPENDENT_AMBULATORY_CARE_PROVIDER_SITE_OTHER): Payer: 59 | Admitting: *Deleted

## 2017-03-02 DIAGNOSIS — J309 Allergic rhinitis, unspecified: Secondary | ICD-10-CM | POA: Diagnosis not present

## 2017-03-08 ENCOUNTER — Ambulatory Visit (INDEPENDENT_AMBULATORY_CARE_PROVIDER_SITE_OTHER): Payer: 59 | Admitting: *Deleted

## 2017-03-08 DIAGNOSIS — J309 Allergic rhinitis, unspecified: Secondary | ICD-10-CM

## 2017-03-16 ENCOUNTER — Ambulatory Visit (INDEPENDENT_AMBULATORY_CARE_PROVIDER_SITE_OTHER): Payer: 59 | Admitting: *Deleted

## 2017-03-16 DIAGNOSIS — J309 Allergic rhinitis, unspecified: Secondary | ICD-10-CM | POA: Diagnosis not present

## 2017-03-22 ENCOUNTER — Ambulatory Visit (INDEPENDENT_AMBULATORY_CARE_PROVIDER_SITE_OTHER): Payer: 59 | Admitting: *Deleted

## 2017-03-22 DIAGNOSIS — J309 Allergic rhinitis, unspecified: Secondary | ICD-10-CM

## 2017-03-31 DIAGNOSIS — H6123 Impacted cerumen, bilateral: Secondary | ICD-10-CM | POA: Diagnosis not present

## 2017-03-31 DIAGNOSIS — H61813 Exostosis of external canal, bilateral: Secondary | ICD-10-CM | POA: Diagnosis not present

## 2017-04-05 ENCOUNTER — Ambulatory Visit (INDEPENDENT_AMBULATORY_CARE_PROVIDER_SITE_OTHER): Payer: 59 | Admitting: *Deleted

## 2017-04-05 DIAGNOSIS — J309 Allergic rhinitis, unspecified: Secondary | ICD-10-CM

## 2017-04-26 ENCOUNTER — Ambulatory Visit (INDEPENDENT_AMBULATORY_CARE_PROVIDER_SITE_OTHER): Payer: 59 | Admitting: *Deleted

## 2017-04-26 DIAGNOSIS — J309 Allergic rhinitis, unspecified: Secondary | ICD-10-CM | POA: Diagnosis not present

## 2017-04-26 DIAGNOSIS — R040 Epistaxis: Secondary | ICD-10-CM | POA: Diagnosis not present

## 2017-05-03 ENCOUNTER — Ambulatory Visit (INDEPENDENT_AMBULATORY_CARE_PROVIDER_SITE_OTHER): Payer: 59

## 2017-05-03 DIAGNOSIS — J309 Allergic rhinitis, unspecified: Secondary | ICD-10-CM

## 2017-05-10 ENCOUNTER — Ambulatory Visit (INDEPENDENT_AMBULATORY_CARE_PROVIDER_SITE_OTHER): Payer: 59 | Admitting: *Deleted

## 2017-05-10 DIAGNOSIS — J309 Allergic rhinitis, unspecified: Secondary | ICD-10-CM | POA: Diagnosis not present

## 2017-05-12 DIAGNOSIS — H01022 Squamous blepharitis right lower eyelid: Secondary | ICD-10-CM | POA: Diagnosis not present

## 2017-05-12 DIAGNOSIS — H00021 Hordeolum internum right upper eyelid: Secondary | ICD-10-CM | POA: Diagnosis not present

## 2017-05-12 DIAGNOSIS — H01025 Squamous blepharitis left lower eyelid: Secondary | ICD-10-CM | POA: Diagnosis not present

## 2017-05-12 DIAGNOSIS — H01024 Squamous blepharitis left upper eyelid: Secondary | ICD-10-CM | POA: Diagnosis not present

## 2017-05-24 ENCOUNTER — Ambulatory Visit (INDEPENDENT_AMBULATORY_CARE_PROVIDER_SITE_OTHER): Payer: 59 | Admitting: *Deleted

## 2017-05-24 DIAGNOSIS — J309 Allergic rhinitis, unspecified: Secondary | ICD-10-CM

## 2017-05-25 DIAGNOSIS — R040 Epistaxis: Secondary | ICD-10-CM | POA: Diagnosis not present

## 2017-05-31 ENCOUNTER — Ambulatory Visit (INDEPENDENT_AMBULATORY_CARE_PROVIDER_SITE_OTHER): Payer: 59 | Admitting: *Deleted

## 2017-05-31 DIAGNOSIS — J309 Allergic rhinitis, unspecified: Secondary | ICD-10-CM | POA: Diagnosis not present

## 2017-06-07 ENCOUNTER — Ambulatory Visit (INDEPENDENT_AMBULATORY_CARE_PROVIDER_SITE_OTHER): Payer: 59 | Admitting: *Deleted

## 2017-06-07 DIAGNOSIS — J309 Allergic rhinitis, unspecified: Secondary | ICD-10-CM | POA: Diagnosis not present

## 2017-06-07 DIAGNOSIS — H6123 Impacted cerumen, bilateral: Secondary | ICD-10-CM | POA: Diagnosis not present

## 2017-06-14 ENCOUNTER — Ambulatory Visit (INDEPENDENT_AMBULATORY_CARE_PROVIDER_SITE_OTHER): Payer: 59

## 2017-06-14 DIAGNOSIS — J309 Allergic rhinitis, unspecified: Secondary | ICD-10-CM | POA: Diagnosis not present

## 2017-06-15 NOTE — Progress Notes (Signed)
VIALS EXP 06-16-18

## 2017-06-16 DIAGNOSIS — J301 Allergic rhinitis due to pollen: Secondary | ICD-10-CM | POA: Diagnosis not present

## 2017-06-22 ENCOUNTER — Ambulatory Visit (INDEPENDENT_AMBULATORY_CARE_PROVIDER_SITE_OTHER): Payer: 59 | Admitting: *Deleted

## 2017-06-22 DIAGNOSIS — J309 Allergic rhinitis, unspecified: Secondary | ICD-10-CM

## 2017-06-28 ENCOUNTER — Ambulatory Visit (INDEPENDENT_AMBULATORY_CARE_PROVIDER_SITE_OTHER): Payer: 59 | Admitting: *Deleted

## 2017-06-28 DIAGNOSIS — J309 Allergic rhinitis, unspecified: Secondary | ICD-10-CM | POA: Diagnosis not present

## 2017-06-30 ENCOUNTER — Telehealth: Payer: Self-pay | Admitting: Gastroenterology

## 2017-06-30 DIAGNOSIS — R197 Diarrhea, unspecified: Secondary | ICD-10-CM

## 2017-06-30 NOTE — Telephone Encounter (Signed)
Patient reports loose stool and "abdominal grumbling" intermittent diarrhea with loose stool.  He is requesting a repeat stool culture.  Reports similar symptoms to when he had giardia last year.  Ok to order stool studies

## 2017-06-30 NOTE — Telephone Encounter (Signed)
Discussed with Nicoletta Ba PA .  Ok to order GI pathogen panel

## 2017-06-30 NOTE — Telephone Encounter (Signed)
Patient Harry [name] possibly wanting a stick test? ordered for him. Requesting a call back as soon as possible at office [phone].

## 2017-07-01 ENCOUNTER — Telehealth: Payer: Self-pay | Admitting: Gastroenterology

## 2017-07-01 ENCOUNTER — Other Ambulatory Visit: Payer: 59

## 2017-07-01 DIAGNOSIS — R197 Diarrhea, unspecified: Secondary | ICD-10-CM

## 2017-07-01 NOTE — Telephone Encounter (Signed)
Patient dropped off stool specimen this am.  He is notified will likely not get the results until next week.  He would like to be seen by Dr. Fuller Plan as soon as possible.  He is scheduled for Monday at 3:45

## 2017-07-05 ENCOUNTER — Telehealth: Payer: Self-pay | Admitting: Gastroenterology

## 2017-07-05 ENCOUNTER — Encounter: Payer: Self-pay | Admitting: Gastroenterology

## 2017-07-05 ENCOUNTER — Ambulatory Visit: Payer: 59 | Admitting: Gastroenterology

## 2017-07-05 VITALS — BP 124/62 | HR 68 | Ht 73.62 in | Wt 211.4 lb

## 2017-07-05 DIAGNOSIS — R197 Diarrhea, unspecified: Secondary | ICD-10-CM

## 2017-07-05 DIAGNOSIS — Z125 Encounter for screening for malignant neoplasm of prostate: Secondary | ICD-10-CM | POA: Diagnosis not present

## 2017-07-05 DIAGNOSIS — R7301 Impaired fasting glucose: Secondary | ICD-10-CM | POA: Diagnosis not present

## 2017-07-05 DIAGNOSIS — Z0000 Encounter for general adult medical examination without abnormal findings: Secondary | ICD-10-CM | POA: Diagnosis not present

## 2017-07-05 DIAGNOSIS — R82998 Other abnormal findings in urine: Secondary | ICD-10-CM | POA: Diagnosis not present

## 2017-07-05 LAB — GASTROINTESTINAL PATHOGEN PANEL PCR
C. difficile Tox A/B, PCR: NOT DETECTED
Campylobacter, PCR: NOT DETECTED
Cryptosporidium, PCR: NOT DETECTED
E coli (ETEC) LT/ST PCR: NOT DETECTED
E coli (STEC) stx1/stx2, PCR: NOT DETECTED
E coli 0157, PCR: NOT DETECTED
Giardia lamblia, PCR: NOT DETECTED
Norovirus, PCR: NOT DETECTED
Rotavirus A, PCR: NOT DETECTED
Salmonella, PCR: NOT DETECTED
Shigella, PCR: NOT DETECTED

## 2017-07-05 MED ORDER — METRONIDAZOLE 500 MG PO TABS: 500.0000 mg | ORAL_TABLET | Freq: Two times a day (BID) | ORAL | 0 refills | Status: DC

## 2017-07-05 NOTE — Telephone Encounter (Signed)
Patient calling back regarding this.  °

## 2017-07-05 NOTE — Patient Instructions (Addendum)
If you are age 78 or older, your body mass index should be between 23-30. Your Body mass index is 26.42 kg/m. If this is out of the aforementioned range listed, please consider follow up with your Primary Care Provider.  If you are age 46 or younger, your body mass index should be between 19-25. Your Body mass index is 26.42 kg/m. If this is out of the aformentioned range listed, please consider follow up with your Primary Care Provider.   We have sent the following medications to your pharmacy for you to pick up at your convenience: Metronidazole 500mg  twice daily x 7 days.

## 2017-07-05 NOTE — Progress Notes (Signed)
    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  He has a history of Giardia in February 2018 which was successfully treated with metronidazole.  He had mild recurrent diarrhea a couple months later and a GI pathogen panel was repeated in May 2018 and it was negative.  Celiac antibody panel was obtained in March 2018 and it was negative.  He felt well until with normal bowel habits until 1 month ago when he had a recurrence of diarrhea with a foul odor.  He notes about 1-2 less well formed stools per day and frequently he will have loose, watery stools.  He has not noted any bleeding.  He occasionally notes a mild rumbling sensation in his lower abdomen but no pain or cramping.  His appetite is very good and his weight is stable.  No fevers. No recent antibiotic usage. He relates a coworker was recently diagnosed with Giardia.  Gastrointestinal pathogen panel was ordered on March 14 but has not returned a result.  He states he makes an effort to have a high amount of raw fruits and raw vegetables in his regular diet.  Colonoscopy performed in June 2013 in [place] for rectal bleeding was normal.  Colonoscopy performed by Dr. Verl Blalock in March 2013 was normal.  Current Medications, Allergies, Past Medical History, Past Surgical History, Family History and Social History were reviewed in [facility] record.  Physical Exam: General: Well developed, well nourished, no acute distress Head: Normocephalic and atraumatic Eyes:  sclerae anicteric, EOMI Ears: Normal auditory acuity Mouth: No deformity or lesions Lungs: Clear throughout to auscultation Heart: Regular rate and rhythm; no murmurs, rubs or bruits Abdomen: Soft, non tender and non distended. No masses, hepatosplenomegaly or hernias noted. Normal Bowel sounds Rectal: not done Musculoskeletal: Symmetrical with no gross deformities  Pulses:  Normal pulses  noted Extremities: No clubbing, cyanosis, edema or deformities noted Neurological: Alert oriented x 4, grossly nonfocal Psychological:  Alert and cooperative. Normal mood and affect  Assessment and Recommendations:  1.  Diarrhea, presumed infectious.  We discussed waiting for the GI pathogen panel to return this week or beginning an antibiotic empirically.  He prefers to proceed with antibiotic treatment now and modify antibiotic therapy pending the results of the GI pathogen panel - this is reasonable.  Metronidazole 500 mg po bid for 7 days as this will provide reasonable coverage for C. difficile and Giardia.  He was cautioned about the use of alcohol while taking metronidazole.  If the GI pathogen panel is positive will adjust antibiotics as indicated and if negative, and his diarrhea persists, will plan for colonoscopy.  I have asked him to eliminate/reduce his intake of raw fruits and vegetables until his diarrhea improves/resolves.

## 2017-07-05 NOTE — Telephone Encounter (Signed)
Patient notified results are not back. He is encouraged to keep the appt today.

## 2017-07-07 ENCOUNTER — Telehealth: Payer: Self-pay | Admitting: Gastroenterology

## 2017-07-07 ENCOUNTER — Ambulatory Visit (INDEPENDENT_AMBULATORY_CARE_PROVIDER_SITE_OTHER): Payer: 59

## 2017-07-07 DIAGNOSIS — J309 Allergic rhinitis, unspecified: Secondary | ICD-10-CM

## 2017-07-07 MED ORDER — CIPROFLOXACIN HCL 500 MG PO TABS: 500.0000 mg | ORAL_TABLET | Freq: Two times a day (BID) | ORAL | 0 refills | Status: DC

## 2017-07-07 NOTE — Telephone Encounter (Signed)
Still having diarrhea. Reviewed concern for low likelihood of a missed infection or possible SIBO. He is traveling to [place] in 10 days. Complete course of Flagyl as prescribed. Add Cipro 500 mg po bid, #14. Schedule colonoscopy - he prefers an afternoon appt.

## 2017-07-07 NOTE — Telephone Encounter (Signed)
I did not promise him any certain date for his colonoscopy or suggest that I overbook. I advised him that you would contact him to work on a date for colonoscopy. Please ask Lanelle Bal or Cyril Mourning if they will allow [place] to add a 12 pm or 1 pm colonoscopy for him on Monday. Of course if anyone cancels for Monday please block the slot.

## 2017-07-07 NOTE — Telephone Encounter (Signed)
rx sent for the cipro.  Patient would like the colonoscopy on Monday.  I have explained numerous times that there are no openings on Monday.  Patient is insistent that he would be worked in when it was convenient.  I advised the patient that I would have to check with Dr. Fuller Plan and call him back tomorrow with the response.  I have encouraged him to take my next available appointment on 08/02/17, but he declines.  " I don't have anything scheduled on Monday and that would be a good day for me". Dr. Fuller Plan please advise

## 2017-07-08 ENCOUNTER — Other Ambulatory Visit: Payer: Self-pay

## 2017-07-08 ENCOUNTER — Ambulatory Visit (AMBULATORY_SURGERY_CENTER): Payer: Self-pay | Admitting: *Deleted

## 2017-07-08 VITALS — Ht 75.0 in | Wt 211.6 lb

## 2017-07-08 DIAGNOSIS — Z1212 Encounter for screening for malignant neoplasm of rectum: Secondary | ICD-10-CM | POA: Diagnosis not present

## 2017-07-08 DIAGNOSIS — R197 Diarrhea, unspecified: Secondary | ICD-10-CM

## 2017-07-08 NOTE — Progress Notes (Signed)
No egg or soy allergy known to patient  No issues with past sedation with any surgeries  or procedures, no intubation problems  No diet pills per patient No home 02 use per patient  No blood thinners per patient  Pt denies issues with constipation  No A fib or A flutter  EMMI video sent to pt's e mail  Pt. declined 

## 2017-07-08 NOTE — Telephone Encounter (Signed)
Patient notified Pre-visit for today and procedure scheduled for 2:30 on 07/12/17

## 2017-07-09 ENCOUNTER — Telehealth: Payer: Self-pay | Admitting: Gastroenterology

## 2017-07-09 NOTE — Telephone Encounter (Signed)
[place] and called in [place] directed  Lelan Pons PV

## 2017-07-12 ENCOUNTER — Encounter: Payer: Self-pay | Admitting: Gastroenterology

## 2017-07-12 ENCOUNTER — Other Ambulatory Visit: Payer: Self-pay | Admitting: Internal Medicine

## 2017-07-12 ENCOUNTER — Ambulatory Visit (AMBULATORY_SURGERY_CENTER): Payer: 59 | Admitting: Gastroenterology

## 2017-07-12 VITALS — BP 127/65 | HR 56 | Temp 98.4°F | Resp 15 | Ht 75.0 in | Wt 211.0 lb

## 2017-07-12 DIAGNOSIS — R197 Diarrhea, unspecified: Secondary | ICD-10-CM | POA: Diagnosis present

## 2017-07-12 DIAGNOSIS — Z0000 Encounter for general adult medical examination without abnormal findings: Secondary | ICD-10-CM | POA: Diagnosis not present

## 2017-07-12 DIAGNOSIS — R109 Unspecified abdominal pain: Secondary | ICD-10-CM | POA: Diagnosis not present

## 2017-07-12 DIAGNOSIS — Z8249 Family history of ischemic heart disease and other diseases of the circulatory system: Secondary | ICD-10-CM | POA: Diagnosis not present

## 2017-07-12 DIAGNOSIS — R194 Change in bowel habit: Secondary | ICD-10-CM | POA: Diagnosis not present

## 2017-07-12 DIAGNOSIS — J309 Allergic rhinitis, unspecified: Secondary | ICD-10-CM | POA: Diagnosis not present

## 2017-07-12 DIAGNOSIS — I700 Atherosclerosis of aorta: Secondary | ICD-10-CM | POA: Diagnosis not present

## 2017-07-12 DIAGNOSIS — Z1389 Encounter for screening for other disorder: Secondary | ICD-10-CM | POA: Diagnosis not present

## 2017-07-12 DIAGNOSIS — Z23 Encounter for immunization: Secondary | ICD-10-CM | POA: Diagnosis not present

## 2017-07-12 DIAGNOSIS — E7849 Other hyperlipidemia: Secondary | ICD-10-CM | POA: Diagnosis not present

## 2017-07-12 DIAGNOSIS — K599 Functional intestinal disorder, unspecified: Secondary | ICD-10-CM | POA: Diagnosis not present

## 2017-07-12 DIAGNOSIS — R7301 Impaired fasting glucose: Secondary | ICD-10-CM | POA: Diagnosis not present

## 2017-07-12 MED ORDER — SODIUM CHLORIDE 0.9 % IV SOLN: 500.0000 mL | Freq: Once | INTRAVENOUS | Status: DC

## 2017-07-12 NOTE — Progress Notes (Signed)
A/ox3 pleased with MAC, report to [facility]

## 2017-07-12 NOTE — Progress Notes (Signed)
Called to room to assist during endoscopic procedure.  Patient ID and intended procedure confirmed with present staff. Received instructions for my participation in the procedure from the performing physician.  

## 2017-07-12 NOTE — Op Note (Signed)
[facility] Patient Name: Harry Zarazua Md Procedure Date: 07/12/2017 1:47 PM MRN: [id number] Endoscopist: Ladene Artist , MD Age: 77 Referring MD:  Date of Birth: [birth year] Gender: Male Account #: [id number] Procedure:                Colonoscopy Indications:              Clinically significant diarrhea of unexplained                            origin, Change in bowel habits Medicines:                Monitored Anesthesia Care Procedure:                Pre-Anesthesia Assessment:                           - Prior to the procedure, a History and Physical                            was performed, and patient medications and                            allergies were reviewed. The patient's tolerance of                            previous anesthesia was also reviewed. The risks                            and benefits of the procedure and the sedation                            options and risks were discussed with the patient.                            All questions were answered, and informed consent                            was obtained. Prior Anticoagulants: The patient has                            taken no previous anticoagulant or antiplatelet                            agents. ASA Grade Assessment: II - A patient with                            mild systemic disease. After reviewing the risks                            and benefits, the patient was deemed in                            satisfactory condition to undergo the procedure.  After obtaining informed consent, the colonoscope                            was passed under direct vision. Throughout the                            procedure, the patient's blood pressure, pulse, and                            oxygen saturations were monitored continuously. The                            Model PCF-H190DL [id number]) scope was introduced                            through the anus and  advanced to the the terminal                            ileum, with identification of the appendiceal                            orifice and IC valve. The ileocecal valve,                            appendiceal orifice, and rectum were photographed.                            The quality of the bowel preparation was good. The                            colonoscopy was performed without difficulty. The                            patient tolerated the procedure well. Scope In: 1:51:38 PM Scope Out: 2:15:02 PM Scope Withdrawal Time: 0 hours 13 minutes 30 seconds  Total Procedure Duration: 0 hours 23 minutes 24 seconds  Findings:                 The perianal and digital rectal examinations were                            normal.                           The terminal ileum appeared normal.                           Scattered medium-mouthed diverticula were found in                            the left colon.                           Internal hemorrhoids were found during  retroflexion. The hemorrhoids were medium-sized and                            Grade I (internal hemorrhoids that do not prolapse).                           The exam was otherwise without abnormality on                            direct and retroflexion views. Random biopsies                            obtained. Complications:            No immediate complications. Estimated blood loss:                            None. Estimated Blood Loss:     Estimated blood loss: none. Impression:               - The examined portion of the ileum was normal.                           - Mild left colon diverticulosis.                           - Internal hemorrhoids.                           - The examination was otherwise normal on direct                            and retroflexion views. Random biopsies obtained. Recommendation:           - Patient has a contact number available for                             emergencies. The signs and symptoms of potential                            delayed complications were discussed with the                            patient. Return to normal activities tomorrow.                            Written discharge instructions were provided to the                            patient.                           - Resume previous diet today.                           - Low FODMAP diet for 2 weeks if diarrhea persist.                           -  Continue present medications.                           - Await pathology results.                           - No repeat colonoscopy due to age and the absence                            of colonic polyps.                           - Perform a CT scan (computed tomography) of                            abdomen with contrast and pelvis with contrast at                            appointment to be scheduled. Ladene Artist, MD 07/12/2017 2:22:06 PM This report has been signed electronically.

## 2017-07-12 NOTE — Patient Instructions (Addendum)
YOU HAD AN ENDOSCOPIC PROCEDURE TODAY AT [facility]:   Refer to the procedure report that was given to you for any specific questions about what was found during the examination.  If the procedure report does not answer your questions, please call your gastroenterologist to clarify.  If you requested that your care partner not be given the details of your procedure findings, then the procedure report has been included in a sealed envelope for you to review at your convenience later.  YOU SHOULD EXPECT: Some feelings of bloating in the abdomen. Passage of more gas than usual.  Walking can help get rid of the air that was put into your GI tract during the procedure and reduce the bloating. If you had a lower endoscopy (such as a colonoscopy or flexible sigmoidoscopy) you may notice spotting of blood in your stool or on the toilet paper. If you underwent a bowel prep for your procedure, you may not have a normal bowel movement for a few days.  Please Note:  You might notice some irritation and congestion in your nose or some drainage.  This is from the oxygen used during your procedure.  There is no need for concern and it should clear up in a day or so.  SYMPTOMS TO REPORT IMMEDIATELY:   Following lower endoscopy (colonoscopy or flexible sigmoidoscopy):  Excessive amounts of blood in the stool  Significant tenderness or worsening of abdominal pains  Swelling of the abdomen that is new, acute  Fever of 100F or higher  For urgent or emergent issues, a gastroenterologist can be reached at any hour by calling [phone].   DIET:  We do recommend a small meal at first, but then you may proceed to your regular diet.  Please follow a Low FODMAP diet for 2 weeks if diarrhea persists. Drink plenty of fluids but you should avoid alcoholic beverages for 24 hours.  MEDICATIONS: Continue present present medications.  Please see handouts given to you by your recovery nurse.  FOLLOW UP:   Follow up with a CT scan (computed tomography) of abdomen with contrast and pelvis with contrast. Dr. Lynne Leader office will call you to schedule appointment. 2 bottles of Ready-CAT 2 and CT scan instruction sheet given to patient to take home.  ACTIVITY:  You should plan to take it easy for the rest of today and you should NOT DRIVE or use heavy machinery until tomorrow (because of the sedation medicines used during the test).    FOLLOW UP: Our staff will call the number listed on your records the next business day following your procedure to check on you and address any questions or concerns that you may have regarding the information given to you following your procedure. If we do not reach you, we will leave a message.  However, if you are feeling well and you are not experiencing any problems, there is no need to return our call.  We will assume that you have returned to your regular daily activities without incident.  If any biopsies were taken you will be contacted by phone or by letter within the next 1-3 weeks.  Please call us at [phone] if you have not heard about the biopsies in 3 weeks.   Thank you for allowing [place] to provide for your healthcare needs today.  SIGNATURES/CONFIDENTIALITY: You and/or your care partner have signed paperwork which will be entered into your electronic medical record.  These signatures attest to the fact that that the  information above on your After Visit Summary has been reviewed and is understood.  Full responsibility of the confidentiality of this discharge information lies with you and/or your care-partner.

## 2017-07-12 NOTE — Progress Notes (Signed)
Pt's states no medical or surgical changes since previsit or office visit. 

## 2017-07-13 ENCOUNTER — Ambulatory Visit (INDEPENDENT_AMBULATORY_CARE_PROVIDER_SITE_OTHER): Payer: 59 | Admitting: *Deleted

## 2017-07-13 ENCOUNTER — Telehealth: Payer: Self-pay | Admitting: *Deleted

## 2017-07-13 ENCOUNTER — Ambulatory Visit
Admission: RE | Admit: 2017-07-13 | Discharge: 2017-07-13 | Disposition: A | Discharge status: Home / Self Care | Payer: 59 | Source: Ambulatory Visit | Attending: Internal Medicine | Admitting: Internal Medicine

## 2017-07-13 DIAGNOSIS — J309 Allergic rhinitis, unspecified: Secondary | ICD-10-CM | POA: Diagnosis not present

## 2017-07-13 DIAGNOSIS — R109 Unspecified abdominal pain: Secondary | ICD-10-CM

## 2017-07-13 DIAGNOSIS — N401 Enlarged prostate with lower urinary tract symptoms: Secondary | ICD-10-CM | POA: Diagnosis not present

## 2017-07-13 MED ORDER — IOPAMIDOL (ISOVUE-300) INJECTION 61%
100.0000 mL | Freq: Once | INTRAVENOUS | Status: AC | PRN
  Administered 2017-07-13: 100 mL via INTRAVENOUS

## 2017-07-13 NOTE — Telephone Encounter (Signed)
  Follow up Call-  Call back number 07/12/2017  Post procedure Call Back phone  # [phone]  Permission to leave phone message Yes  Some recent data might be hidden     Patient questions:  Do you have a fever, pain , or abdominal swelling? No. Pain Score  0 *  Have you tolerated food without any problems? Yes.    Have you been able to return to your normal activities? Yes.    Do you have any questions about your discharge instructions: Diet   No. Medications  No. Follow up visit  No.  Do you have questions or concerns about your Care? No.  Actions: * If pain score is 4 or above: No action needed, pain <4.

## 2017-07-16 ENCOUNTER — Other Ambulatory Visit: Payer: 59

## 2017-07-16 ENCOUNTER — Encounter: Payer: Self-pay | Admitting: Gastroenterology

## 2017-07-26 ENCOUNTER — Ambulatory Visit (INDEPENDENT_AMBULATORY_CARE_PROVIDER_SITE_OTHER): Payer: 59

## 2017-07-26 DIAGNOSIS — J309 Allergic rhinitis, unspecified: Secondary | ICD-10-CM

## 2017-08-02 ENCOUNTER — Ambulatory Visit (INDEPENDENT_AMBULATORY_CARE_PROVIDER_SITE_OTHER): Payer: 59 | Admitting: *Deleted

## 2017-08-02 DIAGNOSIS — J309 Allergic rhinitis, unspecified: Secondary | ICD-10-CM

## 2017-08-11 DIAGNOSIS — D485 Neoplasm of uncertain behavior of skin: Secondary | ICD-10-CM | POA: Diagnosis not present

## 2017-08-11 DIAGNOSIS — L570 Actinic keratosis: Secondary | ICD-10-CM | POA: Diagnosis not present

## 2017-08-11 DIAGNOSIS — B379 Candidiasis, unspecified: Secondary | ICD-10-CM | POA: Diagnosis not present

## 2017-08-12 ENCOUNTER — Ambulatory Visit (INDEPENDENT_AMBULATORY_CARE_PROVIDER_SITE_OTHER): Payer: 59 | Admitting: *Deleted

## 2017-08-12 DIAGNOSIS — J309 Allergic rhinitis, unspecified: Secondary | ICD-10-CM | POA: Diagnosis not present

## 2017-08-16 ENCOUNTER — Ambulatory Visit (INDEPENDENT_AMBULATORY_CARE_PROVIDER_SITE_OTHER): Payer: 59 | Admitting: *Deleted

## 2017-08-16 DIAGNOSIS — J309 Allergic rhinitis, unspecified: Secondary | ICD-10-CM | POA: Diagnosis not present

## 2017-08-25 ENCOUNTER — Ambulatory Visit (INDEPENDENT_AMBULATORY_CARE_PROVIDER_SITE_OTHER): Payer: 59 | Admitting: *Deleted

## 2017-08-25 DIAGNOSIS — J309 Allergic rhinitis, unspecified: Secondary | ICD-10-CM

## 2017-08-26 DIAGNOSIS — J301 Allergic rhinitis due to pollen: Secondary | ICD-10-CM | POA: Diagnosis not present

## 2017-08-26 NOTE — Progress Notes (Signed)
VIALS EXP 08-28-18 

## 2017-08-30 ENCOUNTER — Ambulatory Visit (INDEPENDENT_AMBULATORY_CARE_PROVIDER_SITE_OTHER): Payer: 59 | Admitting: *Deleted

## 2017-08-30 DIAGNOSIS — J309 Allergic rhinitis, unspecified: Secondary | ICD-10-CM

## 2017-09-06 ENCOUNTER — Ambulatory Visit (INDEPENDENT_AMBULATORY_CARE_PROVIDER_SITE_OTHER): Payer: 59 | Admitting: *Deleted

## 2017-09-06 DIAGNOSIS — J309 Allergic rhinitis, unspecified: Secondary | ICD-10-CM | POA: Diagnosis not present

## 2017-09-15 ENCOUNTER — Ambulatory Visit (INDEPENDENT_AMBULATORY_CARE_PROVIDER_SITE_OTHER): Payer: 59

## 2017-09-15 DIAGNOSIS — J309 Allergic rhinitis, unspecified: Secondary | ICD-10-CM

## 2017-09-20 ENCOUNTER — Ambulatory Visit (INDEPENDENT_AMBULATORY_CARE_PROVIDER_SITE_OTHER): Payer: 59

## 2017-09-20 DIAGNOSIS — J309 Allergic rhinitis, unspecified: Secondary | ICD-10-CM

## 2017-10-04 ENCOUNTER — Ambulatory Visit (INDEPENDENT_AMBULATORY_CARE_PROVIDER_SITE_OTHER): Payer: 59

## 2017-10-04 DIAGNOSIS — J309 Allergic rhinitis, unspecified: Secondary | ICD-10-CM

## 2017-10-12 ENCOUNTER — Ambulatory Visit (INDEPENDENT_AMBULATORY_CARE_PROVIDER_SITE_OTHER): Payer: 59 | Admitting: *Deleted

## 2017-10-12 DIAGNOSIS — J309 Allergic rhinitis, unspecified: Secondary | ICD-10-CM

## 2017-10-19 ENCOUNTER — Encounter: Payer: Self-pay | Admitting: Allergy and Immunology

## 2017-10-27 ENCOUNTER — Ambulatory Visit (INDEPENDENT_AMBULATORY_CARE_PROVIDER_SITE_OTHER): Payer: 59 | Admitting: *Deleted

## 2017-10-27 DIAGNOSIS — J309 Allergic rhinitis, unspecified: Secondary | ICD-10-CM | POA: Diagnosis not present

## 2017-11-01 ENCOUNTER — Ambulatory Visit (INDEPENDENT_AMBULATORY_CARE_PROVIDER_SITE_OTHER): Payer: 59 | Admitting: *Deleted

## 2017-11-01 DIAGNOSIS — J309 Allergic rhinitis, unspecified: Secondary | ICD-10-CM | POA: Diagnosis not present

## 2017-11-02 DIAGNOSIS — L738 Other specified follicular disorders: Secondary | ICD-10-CM | POA: Diagnosis not present

## 2017-11-15 ENCOUNTER — Ambulatory Visit (INDEPENDENT_AMBULATORY_CARE_PROVIDER_SITE_OTHER): Payer: 59

## 2017-11-15 DIAGNOSIS — J309 Allergic rhinitis, unspecified: Secondary | ICD-10-CM | POA: Diagnosis not present

## 2017-12-01 ENCOUNTER — Ambulatory Visit (INDEPENDENT_AMBULATORY_CARE_PROVIDER_SITE_OTHER): Payer: 59 | Admitting: *Deleted

## 2017-12-01 DIAGNOSIS — J309 Allergic rhinitis, unspecified: Secondary | ICD-10-CM

## 2017-12-23 ENCOUNTER — Ambulatory Visit (INDEPENDENT_AMBULATORY_CARE_PROVIDER_SITE_OTHER): Payer: 59 | Admitting: *Deleted

## 2017-12-23 DIAGNOSIS — J309 Allergic rhinitis, unspecified: Secondary | ICD-10-CM

## 2017-12-29 ENCOUNTER — Ambulatory Visit (INDEPENDENT_AMBULATORY_CARE_PROVIDER_SITE_OTHER): Payer: 59 | Admitting: *Deleted

## 2017-12-29 ENCOUNTER — Encounter: Payer: Self-pay | Admitting: *Deleted

## 2017-12-29 DIAGNOSIS — J309 Allergic rhinitis, unspecified: Secondary | ICD-10-CM

## 2017-12-29 NOTE — Progress Notes (Signed)
Vials made. Exp: 12-30-18. hv 

## 2017-12-31 DIAGNOSIS — J301 Allergic rhinitis due to pollen: Secondary | ICD-10-CM | POA: Diagnosis not present

## 2018-01-10 ENCOUNTER — Ambulatory Visit (INDEPENDENT_AMBULATORY_CARE_PROVIDER_SITE_OTHER): Payer: 59

## 2018-01-10 DIAGNOSIS — J309 Allergic rhinitis, unspecified: Secondary | ICD-10-CM | POA: Diagnosis not present

## 2018-01-18 ENCOUNTER — Ambulatory Visit (INDEPENDENT_AMBULATORY_CARE_PROVIDER_SITE_OTHER): Payer: 59 | Admitting: *Deleted

## 2018-01-18 DIAGNOSIS — J309 Allergic rhinitis, unspecified: Secondary | ICD-10-CM

## 2018-01-31 ENCOUNTER — Ambulatory Visit (INDEPENDENT_AMBULATORY_CARE_PROVIDER_SITE_OTHER): Payer: 59 | Admitting: *Deleted

## 2018-01-31 DIAGNOSIS — J309 Allergic rhinitis, unspecified: Secondary | ICD-10-CM | POA: Diagnosis not present

## 2018-02-04 ENCOUNTER — Telehealth: Payer: Self-pay | Admitting: Gastroenterology

## 2018-02-04 DIAGNOSIS — R197 Diarrhea, unspecified: Secondary | ICD-10-CM

## 2018-02-04 NOTE — Telephone Encounter (Signed)
Pt states that he is not feeling well would like to discuss with Dr Fuller Plan

## 2018-02-04 NOTE — Telephone Encounter (Signed)
Patient reports that he has been traveling out [place].  He has a week or so of diarrhea.  He is asking for stool studies.  Please advise.

## 2018-02-04 NOTE — Telephone Encounter (Signed)
GI pathogen panel  

## 2018-02-04 NOTE — Telephone Encounter (Signed)
Patient notified to come pick up containers at his convenience

## 2018-02-07 ENCOUNTER — Ambulatory Visit (INDEPENDENT_AMBULATORY_CARE_PROVIDER_SITE_OTHER): Payer: 59

## 2018-02-07 ENCOUNTER — Other Ambulatory Visit: Payer: 59

## 2018-02-07 ENCOUNTER — Telehealth: Payer: Self-pay | Admitting: *Deleted

## 2018-02-07 ENCOUNTER — Telehealth: Payer: Self-pay | Admitting: Allergy and Immunology

## 2018-02-07 DIAGNOSIS — T7800XA Anaphylactic reaction due to unspecified food, initial encounter: Secondary | ICD-10-CM | POA: Diagnosis not present

## 2018-02-07 DIAGNOSIS — J309 Allergic rhinitis, unspecified: Secondary | ICD-10-CM

## 2018-02-07 DIAGNOSIS — J3089 Other allergic rhinitis: Secondary | ICD-10-CM | POA: Diagnosis not present

## 2018-02-07 DIAGNOSIS — R197 Diarrhea, unspecified: Secondary | ICD-10-CM | POA: Diagnosis not present

## 2018-02-07 NOTE — Telephone Encounter (Signed)
Dr. Ernesto Rutherford came in to obtain blood work and was discussing that he believes he may be allergic to Pomegranates and possibly Almonds. He was wondering if he could obtain blood work for [employer] and a Nut Panel, will you please advise?

## 2018-02-07 NOTE — Addendum Note (Signed)
Addended by: Romeo Apple R on: 02/07/2018 12:04 PM   Modules accepted: Orders

## 2018-02-07 NOTE — Telephone Encounter (Signed)
Labs have been added and given [place].

## 2018-02-07 NOTE — Telephone Encounter (Signed)
Labs have been placed. Called Dr. Ernesto Rutherford and informed him. He stated that he will be in shortly to obtain blood work.

## 2018-02-07 NOTE — Telephone Encounter (Signed)
Yes, can add those foods to blood tests.

## 2018-02-07 NOTE — Telephone Encounter (Signed)
Dr. Kozlow will you please advise?  °

## 2018-02-07 NOTE — Telephone Encounter (Signed)
Please inform Dr. Ernesto Rutherford that we can check aeroallergen two profile for Other Allergic Rhinitis diagnosis. He can come to clinic for blood draw.

## 2018-02-07 NOTE — Telephone Encounter (Signed)
Patient walked in to the clinic wanting further allergy testing Says he has had issues in the past but has some new concerns - wants a RAST test done to check on this Patient has allergy testing appt set up for November - but want to know if he actually need to be seen or can he just come and get his labs dont Please cll

## 2018-02-09 DIAGNOSIS — R197 Diarrhea, unspecified: Secondary | ICD-10-CM | POA: Diagnosis not present

## 2018-02-09 DIAGNOSIS — R1084 Generalized abdominal pain: Secondary | ICD-10-CM | POA: Diagnosis not present

## 2018-02-10 DIAGNOSIS — R197 Diarrhea, unspecified: Secondary | ICD-10-CM | POA: Diagnosis not present

## 2018-02-10 LAB — GASTROINTESTINAL PATHOGEN PANEL PCR
C. difficile Tox A/B, PCR: NOT DETECTED
Campylobacter, PCR: NOT DETECTED
Cryptosporidium, PCR: NOT DETECTED
E coli (ETEC) LT/ST PCR: NOT DETECTED
E coli (STEC) stx1/stx2, PCR: NOT DETECTED
E coli 0157, PCR: NOT DETECTED
Giardia lamblia, PCR: DETECTED — AB
Norovirus, PCR: NOT DETECTED
Rotavirus A, PCR: NOT DETECTED
Salmonella, PCR: NOT DETECTED
Shigella, PCR: NOT DETECTED

## 2018-02-11 LAB — IGE NUT PROF. W/COMPONENT RFLX
Brazil Nut IgE: <0.1 kU/L
F020-IgE Almond: <0.1 kU/L
F202-IgE Cashew Nut: <0.1 kU/L
F203-IgE Pistachio Nut: <0.1 kU/L
Hazelnut (Filbert) IgE: 0.25 kU/L — AB
Macadamia Nut, IgE: <0.1 kU/L
Peanut, IgE: 0.11 kU/L — AB
Pecan Nut IgE: <0.1 kU/L
Walnut IgE: <0.1 kU/L

## 2018-02-11 LAB — ALLERGENS W/TOTAL IGE AREA 2
Alternaria Alternata IgE: <0.1 kU/L
Aspergillus Fumigatus IgE: <0.1 kU/L
Bermuda Grass IgE: <0.1 kU/L
Cat Dander IgE: <0.1 kU/L
Cedar, Mountain IgE: 9.6 kU/L — AB
Cladosporium Herbarum IgE: <0.1 kU/L
Cockroach, German IgE: <0.1 kU/L
Common Silver Birch IgE: 0.77 kU/L — AB
Cottonwood IgE: 0.11 kU/L — AB
D Farinae IgE: 1.03 kU/L — AB
D Pteronyssinus IgE: 0.91 kU/L — AB
Dog Dander IgE: <0.1 kU/L
Elm, American IgE: 1.13 kU/L — AB
IgE (Immunoglobulin E), Serum: 64 IU/mL (ref 6–495)
Johnson Grass IgE: <0.1 kU/L
Maple/Box Elder IgE: 0.35 kU/L — AB
Mouse Urine IgE: <0.1 kU/L
Oak, White IgE: 0.9 kU/L — AB
Pecan, Hickory IgE: 0.36 kU/L — AB
Penicillium Chrysogen IgE: <0.1 kU/L
Pigweed, Rough IgE: <0.1 kU/L
Ragweed, Short IgE: 0.39 kU/L — AB
Sheep Sorrel IgE Qn: <0.1 kU/L
Timothy Grass IgE: <0.1 kU/L
White Mulberry IgE: 0.1 kU/L — AB

## 2018-02-11 LAB — ALLERGEN, POMEGRANATE
Class Interpretation: 0
Pomegranate IgE: <0.35 kU/L (ref <0.35)

## 2018-02-11 LAB — PEANUT COMPONENTS
F352-IgE Ara h 8: 0.15 kU/L — AB
F422-IgE Ara h 1: <0.1 kU/L
F423-IgE Ara h 2: <0.1 kU/L
F424-IgE Ara h 3: <0.1 kU/L
F427-IgE Ara h 9: <0.1 kU/L
F447-IgE Ara h 6: <0.1 kU/L

## 2018-02-11 LAB — ALLERGEN COMPONENT COMMENTS

## 2018-02-11 LAB — PANEL 604726
Cor A 1 IgE: 0.4 kU/L — AB
Cor A 14 IgE: <0.1 kU/L
Cor A 8 IgE: <0.1 kU/L
Cor A 9 IgE: <0.1 kU/L

## 2018-02-14 ENCOUNTER — Ambulatory Visit (INDEPENDENT_AMBULATORY_CARE_PROVIDER_SITE_OTHER): Payer: 59

## 2018-02-14 DIAGNOSIS — J309 Allergic rhinitis, unspecified: Secondary | ICD-10-CM | POA: Diagnosis not present

## 2018-02-21 ENCOUNTER — Ambulatory Visit (INDEPENDENT_AMBULATORY_CARE_PROVIDER_SITE_OTHER): Payer: 59 | Admitting: *Deleted

## 2018-02-21 DIAGNOSIS — J309 Allergic rhinitis, unspecified: Secondary | ICD-10-CM

## 2018-02-22 ENCOUNTER — Ambulatory Visit: Payer: Medicare Other | Admitting: Allergy & Immunology

## 2018-02-28 ENCOUNTER — Ambulatory Visit (INDEPENDENT_AMBULATORY_CARE_PROVIDER_SITE_OTHER): Payer: 59 | Admitting: *Deleted

## 2018-02-28 DIAGNOSIS — J309 Allergic rhinitis, unspecified: Secondary | ICD-10-CM | POA: Diagnosis not present

## 2018-03-09 ENCOUNTER — Ambulatory Visit (INDEPENDENT_AMBULATORY_CARE_PROVIDER_SITE_OTHER): Payer: 59 | Admitting: *Deleted

## 2018-03-09 DIAGNOSIS — J309 Allergic rhinitis, unspecified: Secondary | ICD-10-CM | POA: Diagnosis not present

## 2018-03-22 ENCOUNTER — Ambulatory Visit (INDEPENDENT_AMBULATORY_CARE_PROVIDER_SITE_OTHER): Payer: 59 | Admitting: *Deleted

## 2018-03-22 DIAGNOSIS — J309 Allergic rhinitis, unspecified: Secondary | ICD-10-CM | POA: Diagnosis not present

## 2018-03-29 NOTE — Progress Notes (Signed)
Vials exp 03-30-19

## 2018-04-03 DIAGNOSIS — J3089 Other allergic rhinitis: Secondary | ICD-10-CM | POA: Diagnosis not present

## 2018-04-05 ENCOUNTER — Ambulatory Visit (INDEPENDENT_AMBULATORY_CARE_PROVIDER_SITE_OTHER): Payer: 59 | Admitting: *Deleted

## 2018-04-05 DIAGNOSIS — J309 Allergic rhinitis, unspecified: Secondary | ICD-10-CM | POA: Diagnosis not present

## 2018-04-15 ENCOUNTER — Ambulatory Visit (INDEPENDENT_AMBULATORY_CARE_PROVIDER_SITE_OTHER): Payer: 59

## 2018-04-15 DIAGNOSIS — J309 Allergic rhinitis, unspecified: Secondary | ICD-10-CM | POA: Diagnosis not present

## 2018-04-21 ENCOUNTER — Ambulatory Visit (INDEPENDENT_AMBULATORY_CARE_PROVIDER_SITE_OTHER)
Admission: RE | Admit: 2018-04-21 | Discharge: 2018-04-21 | Disposition: A | Discharge status: Home / Self Care | Payer: 59 | Source: Ambulatory Visit | Attending: Pulmonary Disease | Admitting: Pulmonary Disease

## 2018-04-21 ENCOUNTER — Telehealth: Payer: Self-pay | Admitting: Internal Medicine

## 2018-04-21 ENCOUNTER — Telehealth: Payer: Self-pay | Admitting: Pulmonary Disease

## 2018-04-21 ENCOUNTER — Encounter: Payer: Self-pay | Admitting: Internal Medicine

## 2018-04-21 ENCOUNTER — Ambulatory Visit: Payer: 59 | Admitting: Internal Medicine

## 2018-04-21 DIAGNOSIS — J4521 Mild intermittent asthma with (acute) exacerbation: Secondary | ICD-10-CM

## 2018-04-21 DIAGNOSIS — R059 Cough, unspecified: Secondary | ICD-10-CM

## 2018-04-21 DIAGNOSIS — R05 Cough: Secondary | ICD-10-CM

## 2018-04-21 DIAGNOSIS — R911 Solitary pulmonary nodule: Secondary | ICD-10-CM | POA: Diagnosis not present

## 2018-04-21 DIAGNOSIS — J984 Other disorders of lung: Secondary | ICD-10-CM | POA: Diagnosis not present

## 2018-04-21 MED ORDER — MOMETASONE FURO-FORMOTEROL FUM 200-5 MCG/ACT IN AERO: 2.0000 | INHALATION_SPRAY | Freq: Two times a day (BID) | RESPIRATORY_TRACT | 0 refills | Status: DC

## 2018-04-21 MED ORDER — ACETAMINOPHEN-CODEINE #3 300-30 MG PO TABS: 1.0000 | ORAL_TABLET | ORAL | 0 refills | Status: AC | PRN

## 2018-04-21 MED ORDER — PREDNISONE 10 MG PO TABS: ORAL_TABLET | ORAL | 0 refills | Status: DC

## 2018-04-21 NOTE — Telephone Encounter (Signed)
PCCM:  I received a call from Dr. Ernesto Rutherford. Concern for pneumonia with ongoing cough and sputum production. I have placed an order for a CXR. I will setup a follow up appointment with [place] in the office this afternoon.  Please set up an appointment this afternoon for Dr. Ernesto Rutherford. He requested Drs Annamaria Boots or Melvyn Novas if possible. If not, he would be fine seeing one of our Nps as a work in too.   Thanks  Garner Nash, DO [facility] 04/21/2018 7:55 AM

## 2018-04-21 NOTE — Telephone Encounter (Signed)
Received call report from [place] with [facility] on patient's CXR done on 04/21/18.  Please review the result/impression copied below:   IMPRESSION: 7 mm nodular opacity left upper lobe. Advise correlation with noncontrast enhanced chest CT to further assess given this change from 2016 study.  Please advise, thank you.  Will route to Dr. Valeta Harms

## 2018-04-21 NOTE — Telephone Encounter (Signed)
See ov same date 

## 2018-04-21 NOTE — Patient Instructions (Signed)
Try dulera 200 1-2 puffs every 12 hours  Work on inhaler technique:  relax and gently blow all the way out then take a nice smooth deep breath back in, triggering the inhaler at same time you start breathing in.  Hold for up to 5 seconds if you can. Blow out thru nose. Rinse and gargle with water when done  Take delsym two tsp every 12 hours and supplement if needed with tylenol #3  up to 1every 4 hours to suppress the urge to cough. Swallowing water and/or using ice chips/non mint and menthol containing candies (such as lifesavers or sugarless jolly ranchers) are also effective.  You should rest your voice and avoid activities that you know make you cough.  Once you have eliminated the cough for 3 straight days try reducing the tylenol #3 first,  then the delsym as tolerated.    If not improving start Prednisone 10  Mg x 2 each am until better then 1 daily then stop

## 2018-04-21 NOTE — Progress Notes (Signed)
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   History of Present Illness  04/21/2018  Pulmonary/ 1st office eval/ finished zpak one day prior to OV  No better Chief Complaint  Patient presents with  . Acute Visit    Cough off and on since 04/03/18- prod with clear to white sputum.  He has had some runny nose also.   Dyspnea:  Only with coughing fits  Cough: white foamy early morning worst / assoc nasal congestion   Sleep: premature wakening  Due to cough  SABA use: none   No obvious day to day or daytime variability or assoc excess/ purulent sputum or mucus plugs or hemoptysis or cp or chest tightness, subjective wheeze or overt sinus or hb symptoms.   Also denies any obvious fluctuation of symptoms with weather or environmental changes or other aggravating or alleviating factors except as outlined above   No unusual exposure hx or h/o childhood pna/ asthma or knowledge of premature birth.  Current Allergies, Complete Past Medical History, Past Surgical History, Family History, and Social History were reviewed in [employer] record.  ROS  The following are not active complaints unless bolded Hoarseness, sore throat, dysphagia, dental problems, itching, sneezing,  nasal congestion or discharge of excess mucus or purulent secretions, ear ache,   fever, chills, sweats, unintended wt loss or wt gain, classically pleuritic or exertional cp,  orthopnea pnd or arm/hand swelling  or leg swelling, presyncope, palpitations, abdominal pain, anorexia, nausea, vomiting, diarrhea  or change in bowel habits or change in bladder habits, change in stools or change in urine, dysuria, hematuria,  rash, arthralgias, visual complaints, headache, numbness, weakness or ataxia or problems with walking or  coordination,  change in mood or  memory.           Past Medical History:  Diagnosis Date  . Allergic rhinitis   . Allergy   . Asthma    50 years ago  . Giardia     Outpatient Medications Prior to Visit  Medication Sig Dispense Refill  . EPINEPHrine (EPIPEN 2-PAK) 0.3 mg/0.3 mL IJ SOAJ injection Use as directed for life-threatening allergic reaction.    . fexofenadine (ALLEGRA) 180 MG tablet Take 180 mg by mouth.    . Multiple Vitamins-Minerals (MULTIVITAMIN ADULTS PO) Take by mouth.    . NONFORMULARY OR COMPOUNDED ITEM Allergy Vaccine 1:10 Given at [facility]        Objective:     BP 122/66 (BP Location: Left Arm, Cuff Size: Normal)   Pulse 73   Temp (!) 97.2 F (36.2 C) (Oral)   Ht 6\' 3"  (1.905 m)   Wt 211 lb 9.6 oz (96 kg)   SpO2 97%   BMI 26.45 kg/m   SpO2: 97 % RA   HEENT: nl dentition, and oropharynx.  Mod  bilateral non-specific turbinate edema    NECK :  without JVD/Nodes/TM  LUNGS: no acc muscle use,  Nl contour chest mild insp/exp rhonchi  bilaterally with  cough   exp maneuvers   CV:  RRR  no s3 or murmur or increase in P2, and no edema   ABD:  soft and nontender with nl inspiratory excursion in the supine position.  MS:  Nl gait/ ext warm without deformities,  calf tenderness, cyanosis or clubbing No obvious joint restrictions   SKIN: warm and dry without lesions    NEURO:  alert, approp, nl sensorium with  no motor or cerebellar deficits apparent.       CXR PA and Lateral:   04/21/2018 :    I personally reviewed images and agree with radiology impression as follows:   7 mm nodular opacity left upper lobe My review: appears similar to prior study Mar 25 2015 , can't be seen on lateral in either study       Assessment   Mild asthma with acute exacerbation 04/21/2018  After extensive coaching inhaler device,  effectiveness =    90% so try dulera 200 up to 2 q 12h    Occurred in setting of viral uri in pt with underlying allergic  rhinitis so ok to treat short term with dulera Based on two studies from NEJM  378; 20 p 1865 (2018) and 380 : p2020-30 (2019) in pts with mild asthma it is reasonable to use low dose symbicort eg 80 2bid "prn" flare (since we don't have symb 80 samples will use dulera 200 for this purpose for now as very similar)  in this setting but I emphasized this was only shown with symbicort and takes advantage of the rapid onset of action but is not the same as "rescue therapy" but can be stopped once the acute symptoms have resolved and the need for rescue has been minimized (< 2 x weekly)    If not better add low dose prednisone course- see avs for instructions unique to this ov     In addition: Of the three most common causes of  Sub-acute / recurrent or chronic cough, only one (GERD)  can actually contribute to/ trigger  the other two (asthma and post nasal drip syndrome)  and perpetuate the cylce of cough.  While not intuitively obvious, many patients with chronic low grade reflux do not cough until there is a primary insult that disturbs the protective epithelial barrier and exposes sensitive nerve endings.   This is typically viral but can due to PNDS and  either may apply here.   The point is that once this occurs, it is difficult to eliminate the cycle once severe s elimiating the cough itself for up to 3 straight days using non-mint/menthol candies, delsym and codeine / advised      Solitary pulmonary nodule See cxr 04/21/2018 ? Present since 2016 ? - placed in reminder file for 10/20/18   Never smoker, probably present previously, low risk so  reasonable to f/u with CT chest in 6 months in this setting to close the loop  Discussed in detail all the  indications, usual  risks and alternatives  relative to the benefits with patient who agrees to proceed with conservative f/u as outlined       Total time devoted to counseling  > 50 % of initial 40 min office visit:  review case with pt/  device  teaching which extended face to face time for this visit /   discussion of options/alternatives/ personally creating written customized instructions  in presence of pt  then going over those specific  Instructions directly with the pt including how to use all of the meds but in particular covering each new medication in detail and the difference between the maintenance= "automatic" meds and the prns using an action plan format for the latter (If this problem/symptom => do that organization reading Left to  right).  Please see AVS from this visit for a full list of these instructions which I personally wrote for this pt and  are unique to this visit.      Christinia Gully, MD 04/21/2018

## 2018-04-21 NOTE — Telephone Encounter (Signed)
PCCM:  Patient is seeing Dr. Melvyn Novas today. Please inform Dr. Melvyn Novas as well.   Thanks  Garner Nash, DO [facility] 04/21/2018 10:16 AM

## 2018-04-21 NOTE — Telephone Encounter (Signed)
Message has been sent to [facility].

## 2018-04-21 NOTE — Telephone Encounter (Signed)
Call made to Dr. Ernesto Rutherford, he would like to be seen by a physician.   Per Dr. Valeta Harms:  I received a call from Dr. Ernesto Rutherford. Concern for pneumonia with ongoing cough and sputum production. I have placed an order for a CXR. I will setup a follow up appointment with [place] in the office this afternoon.  Please set up an appointment this afternoon for Dr. Ernesto Rutherford. He requested Drs Annamaria Boots or Melvyn Novas if possible. If not, he would be fine seeing one of our Nps as a work in too.   Dr. Melvyn Novas  Or Dr. Annamaria Boots please advise if one of you guys can fit Dr. Ernesto Rutherford in this afternoon after lunch per his request.

## 2018-04-21 NOTE — Telephone Encounter (Signed)
Will route message to MW as the pt only wants to speak to him.

## 2018-04-21 NOTE — Telephone Encounter (Signed)
Ok to add on

## 2018-04-21 NOTE — Telephone Encounter (Signed)
Called and spoke with Dr. Ernesto Rutherford. He has been scheduled to see Dr. Melvyn Novas today at 1:30. Patient stated that he will show up a little early. Nothing further needed. Will route to MW and Magda Paganini as an FYI.

## 2018-04-22 ENCOUNTER — Encounter: Payer: Self-pay | Admitting: Internal Medicine

## 2018-04-22 DIAGNOSIS — R911 Solitary pulmonary nodule: Secondary | ICD-10-CM | POA: Insufficient documentation

## 2018-04-22 NOTE — Assessment & Plan Note (Addendum)
See cxr 04/21/2018 ? Present since 2016 ? - placed in reminder file for 10/20/18     Never smoker, probably present previously, low risk so  reasonable to f/u with CT chest in 6 months in this setting to close the loop   Discussed in detail all the  indications, usual  risks and alternatives  relative to the benefits with patient who agrees to proceed with conservative f/u as outlined

## 2018-04-22 NOTE — Assessment & Plan Note (Addendum)
04/21/2018  After extensive coaching inhaler device,  effectiveness =    90% so try dulera 200 up to 2 q 12h   Occurred in setting of viral uri in pt with underlying allergic rhinitis so ok to treat short term with dulera Based on two studies from [place]; 20 p 1865 (2018) and 380 : p2020-30 (2019) in pts with mild asthma it is reasonable to use low dose symbicort eg 80 2bid "prn" flare (since we don't have symb 80 samples will use dulera 200 for this purpose for now as very similar)  in this setting but I emphasized this was only shown with symbicort and takes advantage of the rapid onset of action but is not the same as "rescue therapy" but can be stopped once the acute symptoms have resolved and the need for rescue has been minimized (< 2 x weekly)    If not better add low dose prednisone course- see avs for instructions unique to this ov     In addition: Of the three most common causes of  Sub-acute / recurrent or chronic cough, only one (GERD)  can actually contribute to/ trigger  the other two (asthma and post nasal drip syndrome)  and perpetuate the cylce of cough.  While not intuitively obvious, many patients with chronic low grade reflux do not cough until there is a primary insult that disturbs the protective epithelial barrier and exposes sensitive nerve endings.   This is typically viral but can due to PNDS and  either may apply here.   The point is that once this occurs, it is difficult to eliminate the cycle once severe s elimiating the cough itself for up to 3 straight days using non-mint/menthol candies, delsym and codeine / advised    Total time devoted to counseling  > 50 % of initial 40 min office visit:  review case with pt/  device teaching which extended face to face time for this visit /   discussion of options/alternatives/ personally creating written customized instructions  in presence of pt  then going over those specific  Instructions directly with the pt including how to  use all of the meds but in particular covering each new medication in detail and the difference between the maintenance= "automatic" meds and the prns using an action plan format for the latter (If this problem/symptom => do that organization reading Left to right).  Please see AVS from this visit for a full list of these instructions which I personally wrote for this pt and  are unique to this visit.

## 2018-05-03 ENCOUNTER — Ambulatory Visit (INDEPENDENT_AMBULATORY_CARE_PROVIDER_SITE_OTHER): Payer: 59 | Admitting: *Deleted

## 2018-05-03 DIAGNOSIS — J309 Allergic rhinitis, unspecified: Secondary | ICD-10-CM

## 2018-05-20 ENCOUNTER — Telehealth: Payer: Self-pay | Admitting: Internal Medicine

## 2018-05-20 MED ORDER — PREDNISONE 10 MG PO TABS: ORAL_TABLET | ORAL | 0 refills | Status: DC

## 2018-05-20 NOTE — Telephone Encounter (Signed)
Still some nasal congestion/ no purulent mucus   rec Prednisone 10 mg take  4 each am x 2 days,   2 each am x 2 days,  1 each am x 2 days and stop

## 2018-05-20 NOTE — Telephone Encounter (Signed)
Noted by triage. Will route to Dr. Melvyn Novas as he is familiar with Dr. Ernesto Rutherford.

## 2018-05-20 NOTE — Telephone Encounter (Signed)
Done . rx sent  

## 2018-05-23 ENCOUNTER — Telehealth: Payer: Self-pay | Admitting: Neurology

## 2018-05-23 ENCOUNTER — Other Ambulatory Visit: Payer: Self-pay | Admitting: Neurology

## 2018-05-23 ENCOUNTER — Emergency Department (HOSPITAL_COMMUNITY)
Admission: EM | Admit: 2018-05-23 | Discharge: 2018-05-23 | Disposition: A | Discharge status: Home / Self Care | Payer: 59 | Attending: Emergency Medicine | Admitting: Emergency Medicine

## 2018-05-23 ENCOUNTER — Emergency Department (HOSPITAL_COMMUNITY): Payer: 59

## 2018-05-23 DIAGNOSIS — R42 Dizziness and giddiness: Secondary | ICD-10-CM | POA: Insufficient documentation

## 2018-05-23 DIAGNOSIS — Z87891 Personal history of nicotine dependence: Secondary | ICD-10-CM | POA: Diagnosis not present

## 2018-05-23 DIAGNOSIS — Z79899 Other long term (current) drug therapy: Secondary | ICD-10-CM | POA: Diagnosis not present

## 2018-05-23 DIAGNOSIS — J45909 Unspecified asthma, uncomplicated: Secondary | ICD-10-CM | POA: Diagnosis not present

## 2018-05-23 LAB — URINALYSIS, ROUTINE W REFLEX MICROSCOPIC
Bilirubin Urine: NEGATIVE
Glucose, UA: NEGATIVE mg/dL
Hgb urine dipstick: NEGATIVE
Ketones, ur: NEGATIVE mg/dL
Leukocytes, UA: NEGATIVE
Nitrite: NEGATIVE
Protein, ur: NEGATIVE mg/dL
Specific Gravity, Urine: 1.018 (ref 1.005–1.030)
pH: 5 (ref 5.0–8.0)

## 2018-05-23 LAB — CBC WITH DIFFERENTIAL/PLATELET
Abs Immature Granulocytes: 0.03 10*3/uL (ref 0.00–0.07)
Basophils Absolute: 0 10*3/uL (ref 0.0–0.1)
Basophils Relative: 0 %
Eosinophils Absolute: 0.3 10*3/uL (ref 0.0–0.5)
Eosinophils Relative: 6 %
HCT: 46.9 % (ref 39.0–52.0)
Hemoglobin: 15.1 g/dL (ref 13.0–17.0)
Immature Granulocytes: 1 %
Lymphocytes Relative: 32 %
Lymphs Abs: 1.5 10*3/uL (ref 0.7–4.0)
MCH: 30.8 pg (ref 26.0–34.0)
MCHC: 32.2 g/dL (ref 30.0–36.0)
MCV: 95.7 fL (ref 80.0–100.0)
Monocytes Absolute: 0.4 10*3/uL (ref 0.1–1.0)
Monocytes Relative: 9 %
Neutro Abs: 2.4 10*3/uL (ref 1.7–7.7)
Neutrophils Relative %: 52 %
Platelets: 172 10*3/uL (ref 150–400)
RBC: 4.9 MIL/uL (ref 4.22–5.81)
RDW: 12 % (ref 11.5–15.5)
WBC: 4.7 10*3/uL (ref 4.0–10.5)
nRBC: 0 % (ref 0.0–0.2)

## 2018-05-23 LAB — BASIC METABOLIC PANEL
Anion gap: 12 (ref 5–15)
BUN: 21 mg/dL (ref 8–23)
CO2: 22 mmol/L (ref 22–32)
Calcium: 9 mg/dL (ref 8.9–10.3)
Chloride: 105 mmol/L (ref 98–111)
Creatinine, Ser: 0.93 mg/dL (ref 0.61–1.24)
GFR calc Af Amer: >60 mL/min (ref >60)
GFR calc non Af Amer: >60 mL/min (ref >60)
Glucose, Bld: 125 mg/dL — ABNORMAL HIGH (ref 70–99)
Potassium: 4.1 mmol/L (ref 3.5–5.1)
Sodium: 139 mmol/L (ref 135–145)

## 2018-05-23 MED ORDER — ONDANSETRON 4 MG PO TBDP: 4.0000 mg | ORAL_TABLET | Freq: Three times a day (TID) | ORAL | 6 refills | Status: DC | PRN

## 2018-05-23 NOTE — Discharge Instructions (Addendum)
You were seen in the emergency department for 1 month of lightheadedness.  You had lab work that was unremarkable.  Your MRI also did not show any acute findings.  Will be important for you to follow-up with your primary care doctor and neurology.  Please return if any worsening symptoms.

## 2018-05-23 NOTE — ED Provider Notes (Signed)
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  States it is mostly a lightheaded feeling but he feels like he is unsteady on his feet.  He states that he feels he needs an MRI of his brain.  No numbness, tingling or focal weakness.  No vision or hearing changes.  No ear pain, tinnitus.  No new hearing loss.  No chest pain or chest discomfort, shortness of breath.  No vomiting, diarrhea, bloody stools or melena.  Reports 3 weeks ago he had flulike symptoms which have improved.  No longer having fever or cough.  Feels like he may be coming down with another viral illness.  States he was also worried that his electrolytes could be off or this could be related to his blood sugar.  ROS: See HPI Constitutional: no fever  Eyes: no drainage  ENT: no runny nose   Cardiovascular:  no chest pain  Resp: no SOB  GI: no vomiting GU: no dysuria Integumentary: no rash  Allergy: no hives  Musculoskeletal: no leg swelling  Neurological: no slurred speech ROS otherwise negative  PAST MEDICAL HISTORY/PAST SURGICAL HISTORY:  Past Medical History:  Diagnosis Date  . Allergic rhinitis   . Allergy   . Asthma    50 years ago  . Giardia     MEDICATIONS:  Prior to Admission medications   Medication Sig Start Date End Date Taking? Authorizing Provider  EPINEPHrine (EPIPEN 2-PAK) 0.3 mg/0.3 mL IJ SOAJ injection Use as directed for life-threatening allergic reaction.    [provider]  fexofenadine (ALLEGRA) 180 MG tablet Take 180 mg by mouth.    [provider]  mometasone-formoterol (DULERA) 200-5 MCG/ACT AERO Inhale 2 puffs into the lungs 2 (two) times daily. 04/21/18   Tanda Rockers, MD  Multiple Vitamins-Minerals (MULTIVITAMIN ADULTS PO) Take by mouth.    [provider]  NONFORMULARY OR COMPOUNDED ITEM  Allergy Vaccine 1:10 Given at [facility]    [provider]  predniSONE (DELTASONE) 10 MG tablet Take prednisone 10 mg x 2 until better then one daily x 3 days and stop 04/21/18   Tanda Rockers, MD  predniSONE (DELTASONE) 10 MG tablet 4 x 2 days, 2 x 2 days, 1 x 2 days, then stop 05/20/18   Tanda Rockers, MD    ALLERGIES:  Allergies  Allergen Reactions  . Apple   . Bee Venom Other (See Comments)    unknown  . Blackberry [Rubus Fruticosus]   . Onion   . Peach Flavor   . Pear   . Plum Pulp     SOCIAL HISTORY:  Social History   Tobacco Use  . Smoking status: Former Smoker    Types: Pipe  . Smokeless tobacco: Never Used  . Tobacco comment: 40 years ago  Substance Use Topics  . Alcohol use: Yes    Comment: occasionally    FAMILY HISTORY: Family History  Problem Relation Age of Onset  . Aortic aneurysm Father 1  . Lung cancer Mother 51  . Allergic rhinitis Unknown        GM  . Hypertension Brother   . Pulmonary embolism Maternal Grandfather   . Heart attack Neg Hx   . Stroke Neg Hx   . Colon cancer Neg Hx   . Colon polyps Neg Hx   . Esophageal  cancer Neg Hx   . Rectal cancer Neg Hx   . Stomach cancer Neg Hx     EXAM: BP (!) 130/91 (BP Location: Right Arm)   Pulse 69   Temp (!) 97.5 F (36.4 C) (Oral)   Resp 18   SpO2 95%  CONSTITUTIONAL: Alert and oriented and responds appropriately to questions. Well-appearing; well-nourished HEAD: Normocephalic EYES: Conjunctivae clear, pupils appear equal, EOMI ENT: normal nose; moist mucous membranes; No pharyngeal erythema, no tonsillar hypertrophy or exudate, no uvular deviation, no unilateral swelling, no trismus or drooling, no muffled voice, normal phonation, no stridor, no Ludwig's angina, tongue sits flat in the bottom of the mouth, no angioedema, no facial erythema, no facial swelling. TMs are clear bilaterally without erythema, purulence, bulging, perforation, effusion.  No cerumen impaction or  sign of foreign body in the external auditory canal. No inflammation, erythema or drainage from the external auditory canal. No signs of mastoiditis. No pain with manipulation of the pinna bilaterally. NECK: Supple, no meningismus CARD: RRR; S1 and S2 appreciated; no murmurs, no clicks, no rubs, no gallops RESP: Normal chest excursion without splinting or tachypnea; breath sounds clear and equal bilaterally; no wheezes, no rhonchi, no rales, no hypoxia or respiratory distress, speaking full sentences ABD/GI: Normal bowel sounds; non-distended; soft, non-tender, no rebound, no guarding, no peritoneal signs, no hepatosplenomegaly BACK:  The back appears normal EXT: Normal ROM in all joints; normal in appearance, no swelling, no cyanosis SKIN: Normal color for age and race; warm; no rash NEURO: Moves all extremities equally, strength 5/5 in all 4 extremities, cranial nerves II through XII intact, sensation to light touch intact diffusely, normal speech, no dysmetria to finger-to-nose testing bilaterally PSYCH: The patient's mood and manner are appropriate. Grooming and personal hygiene are appropriate.  MEDICAL DECISION MAKING: Patient here with complaints of feeling unsteady on his feet for the past month.  Reports feeling lightheaded but no true vertiginous symptoms.  Neurologic exam currently normal.  Hemodynamically stable but states his blood pressure is slightly higher than it normally runs.  He is otherwise healthy and not on medications other than allergy medications.  Recently had flulike symptoms that have improved.  No longer having fever or cough.  Will check labs, urine today to evaluate for possible electrolyte abnormality, anemia, dehydration, UTI.  Will also obtain MRI of the brain to rule out stroke.  Outside of TPA window given 1 month of symptoms and currently NIH stroke scale is 0.  EKG shows sinus rhythm with first-degree AV block but no other interval changes, ischemic abnormality or  arrhythmia.  ED PROGRESS: 6:15 AM  Patient's labs, urine reassuring.  Discussed these findings with Dr. Ernesto Rutherford.  MRI pending.   7:00 AM  Signed out to Dr. Melina Copa to follow up on MRI results.   I reviewed all nursing notes, vitals, pertinent previous records, EKGs, lab and urine results, imaging (as available).    EKG Interpretation  Date/Time:  Monday May 23 2018 04:48:23 EST Ventricular Rate:  66 PR Interval:  216 QRS Duration: 96 QT Interval:  406 QTC Calculation: 425 R Axis:   51 Text Interpretation:  Sinus rhythm with 1st degree A-V block Otherwise normal ECG No old tracing to compare Confirmed by Ward, Cyril Mourning [phone]) on 05/23/2018 4:53:40 AM         Ward, Delice Bison, DO 05/23/18 [phone]

## 2018-05-23 NOTE — ED Provider Notes (Signed)
Patient signed out to me from Dr. Leonides Schanz.  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  Initial work-up is been unremarkable.  Set up plan is to follow-up on MRI brain.   Patient's MRI did not show any acute findings.  I explained the results to him and given him a copy of the report.  He is actively reaching out to neurology to follow-up with them.  He is comfortable with discharge.      Hayden Rasmussen, MD 05/23/18 [phone]

## 2018-05-23 NOTE — Telephone Encounter (Signed)
I discussed with patient. Symptoms likely viral. I reviewed the MRi images and micro-hemorrhages are incidental. Will try zofran prn for the dizziness and he will see me in the office. thanks

## 2018-05-23 NOTE — ED Triage Notes (Signed)
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  States he has been sick twice with URI/cold symptoms that has gotten better but continued to have vertigo.  EKG obtained at triage and pt straight to treatment room.

## 2018-05-23 NOTE — ED Notes (Signed)
Patient verbalizes understanding of discharge instructions. Opportunity for questioning and answers were provided. Armband removed by staff, pt discharged from ED ambulatory.   

## 2018-05-24 ENCOUNTER — Ambulatory Visit (INDEPENDENT_AMBULATORY_CARE_PROVIDER_SITE_OTHER): Payer: 59

## 2018-05-24 DIAGNOSIS — J309 Allergic rhinitis, unspecified: Secondary | ICD-10-CM

## 2018-05-24 DIAGNOSIS — R7301 Impaired fasting glucose: Secondary | ICD-10-CM | POA: Diagnosis not present

## 2018-05-26 DIAGNOSIS — R918 Other nonspecific abnormal finding of lung field: Secondary | ICD-10-CM | POA: Diagnosis not present

## 2018-05-26 DIAGNOSIS — R7301 Impaired fasting glucose: Secondary | ICD-10-CM | POA: Diagnosis not present

## 2018-05-26 DIAGNOSIS — J3089 Other allergic rhinitis: Secondary | ICD-10-CM | POA: Diagnosis not present

## 2018-05-26 DIAGNOSIS — Z6826 Body mass index (BMI) 26.0-26.9, adult: Secondary | ICD-10-CM | POA: Diagnosis not present

## 2018-05-26 DIAGNOSIS — R030 Elevated blood-pressure reading, without diagnosis of hypertension: Secondary | ICD-10-CM | POA: Diagnosis not present

## 2018-06-06 ENCOUNTER — Ambulatory Visit (INDEPENDENT_AMBULATORY_CARE_PROVIDER_SITE_OTHER): Payer: 59

## 2018-06-06 DIAGNOSIS — J309 Allergic rhinitis, unspecified: Secondary | ICD-10-CM

## 2018-06-13 ENCOUNTER — Ambulatory Visit (INDEPENDENT_AMBULATORY_CARE_PROVIDER_SITE_OTHER): Payer: 59

## 2018-06-13 DIAGNOSIS — J309 Allergic rhinitis, unspecified: Secondary | ICD-10-CM | POA: Diagnosis not present

## 2018-06-21 ENCOUNTER — Ambulatory Visit (INDEPENDENT_AMBULATORY_CARE_PROVIDER_SITE_OTHER): Payer: 59 | Admitting: *Deleted

## 2018-06-21 DIAGNOSIS — J309 Allergic rhinitis, unspecified: Secondary | ICD-10-CM | POA: Diagnosis not present

## 2018-06-22 DIAGNOSIS — H6123 Impacted cerumen, bilateral: Secondary | ICD-10-CM | POA: Diagnosis not present

## 2018-06-27 ENCOUNTER — Ambulatory Visit (INDEPENDENT_AMBULATORY_CARE_PROVIDER_SITE_OTHER): Payer: 59

## 2018-06-27 DIAGNOSIS — J309 Allergic rhinitis, unspecified: Secondary | ICD-10-CM

## 2018-07-27 ENCOUNTER — Ambulatory Visit (INDEPENDENT_AMBULATORY_CARE_PROVIDER_SITE_OTHER): Payer: 59 | Admitting: *Deleted

## 2018-07-27 DIAGNOSIS — J309 Allergic rhinitis, unspecified: Secondary | ICD-10-CM | POA: Diagnosis not present

## 2018-08-17 ENCOUNTER — Ambulatory Visit (INDEPENDENT_AMBULATORY_CARE_PROVIDER_SITE_OTHER): Payer: 59

## 2018-08-17 DIAGNOSIS — J309 Allergic rhinitis, unspecified: Secondary | ICD-10-CM

## 2018-09-21 ENCOUNTER — Ambulatory Visit (INDEPENDENT_AMBULATORY_CARE_PROVIDER_SITE_OTHER): Payer: Medicare Other

## 2018-09-21 DIAGNOSIS — J309 Allergic rhinitis, unspecified: Secondary | ICD-10-CM

## 2018-10-04 ENCOUNTER — Ambulatory Visit (INDEPENDENT_AMBULATORY_CARE_PROVIDER_SITE_OTHER): Payer: Medicare Other | Admitting: *Deleted

## 2018-10-04 DIAGNOSIS — J309 Allergic rhinitis, unspecified: Secondary | ICD-10-CM | POA: Diagnosis not present

## 2018-10-12 NOTE — Progress Notes (Signed)
Vials exp 10-12-2019

## 2018-10-12 NOTE — Progress Notes (Signed)
Reprinted vial labels

## 2018-10-13 DIAGNOSIS — J301 Allergic rhinitis due to pollen: Secondary | ICD-10-CM

## 2018-10-17 DIAGNOSIS — J309 Allergic rhinitis, unspecified: Secondary | ICD-10-CM

## 2018-10-20 ENCOUNTER — Telehealth: Payer: Self-pay | Admitting: *Deleted

## 2018-10-20 NOTE — Telephone Encounter (Signed)
-----   Message from Tanda Rockers, MD sent at 04/22/2018  6:50 AM EST ----- F/u ct chest due re spn

## 2018-10-20 NOTE — Telephone Encounter (Signed)
Spoke with the pt  He refused the CT and states that he reviewed the images from prior films with the radiologist and that he did not feel that this is neccessary  He does not want to risk the exposure  I advised will let Dr Melvyn Novas know of his response

## 2018-10-31 ENCOUNTER — Ambulatory Visit (INDEPENDENT_AMBULATORY_CARE_PROVIDER_SITE_OTHER): Payer: Medicare Other

## 2018-10-31 ENCOUNTER — Telehealth: Payer: Self-pay | Admitting: Allergy and Immunology

## 2018-10-31 DIAGNOSIS — J309 Allergic rhinitis, unspecified: Secondary | ICD-10-CM | POA: Diagnosis not present

## 2018-10-31 NOTE — Telephone Encounter (Signed)
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 He stated he did not need an appointment and he was feeling fine. He said if you wanted to check on him, you could personally call him.

## 2018-11-11 NOTE — Telephone Encounter (Signed)
noted 

## 2018-11-25 ENCOUNTER — Ambulatory Visit (INDEPENDENT_AMBULATORY_CARE_PROVIDER_SITE_OTHER): Payer: Medicare Other | Admitting: *Deleted

## 2018-11-25 DIAGNOSIS — J309 Allergic rhinitis, unspecified: Secondary | ICD-10-CM | POA: Diagnosis not present

## 2018-12-02 ENCOUNTER — Ambulatory Visit: Payer: Self-pay | Admitting: *Deleted

## 2019-01-02 ENCOUNTER — Ambulatory Visit (INDEPENDENT_AMBULATORY_CARE_PROVIDER_SITE_OTHER): Payer: Medicare Other

## 2019-01-02 DIAGNOSIS — J309 Allergic rhinitis, unspecified: Secondary | ICD-10-CM

## 2019-01-09 ENCOUNTER — Ambulatory Visit (INDEPENDENT_AMBULATORY_CARE_PROVIDER_SITE_OTHER): Payer: Medicare Other

## 2019-01-09 DIAGNOSIS — J309 Allergic rhinitis, unspecified: Secondary | ICD-10-CM

## 2019-01-23 ENCOUNTER — Ambulatory Visit (INDEPENDENT_AMBULATORY_CARE_PROVIDER_SITE_OTHER): Payer: Medicare Other | Admitting: *Deleted

## 2019-01-23 DIAGNOSIS — J309 Allergic rhinitis, unspecified: Secondary | ICD-10-CM

## 2019-01-24 ENCOUNTER — Ambulatory Visit (INDEPENDENT_AMBULATORY_CARE_PROVIDER_SITE_OTHER): Payer: Medicare Other | Admitting: Allergy and Immunology

## 2019-01-24 ENCOUNTER — Encounter: Payer: Self-pay | Admitting: Allergy and Immunology

## 2019-01-24 DIAGNOSIS — J3089 Other allergic rhinitis: Secondary | ICD-10-CM | POA: Diagnosis not present

## 2019-01-24 NOTE — Progress Notes (Signed)
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 male who returns to the [facility] on 01/24/2019 in re-evaluation of the following:  HPI: This is a E - med visit requested by patient who is located at home.  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  No allergy symptoms at all. No nasal and no eye symptoms. Occassionally uses OTC Allegra.  Injections every 3 weeks without adverse effect.   Allergies as of 01/24/2019      Reactions   Apple    Bee Venom Other (See Comments)   unknown   Blackberry [rubus Fruticosus]    Onion    Peach Flavor    Pear    Plum Pulp       Medication List    EpiPen 2-Pak 0.3 mg/0.3 mL Soaj injection Generic drug: EPINEPHrine Use as directed for life-threatening allergic reaction.   fexofenadine 180 MG tablet Commonly known as: ALLEGRA Take 180 mg by mouth.   MULTIVITAMIN ADULTS PO Take by mouth.   NONFORMULARY OR COMPOUNDED ITEM Allergy Vaccine 1:10        Past Medical History:  Diagnosis Date  . Allergic rhinitis   . Allergy   . Asthma    50 years ago  . Giardia     Past Surgical History:  Procedure Laterality Date  . abdominal aorta ultrasound  07/22/2009   mild atherosclerotic changes without aneurysm  . COLONOSCOPY    . HAND SURGERY  age 33   for infection  . NASAL HEMORRHAGE CONTROL N/A 01/08/2017   Procedure: MINOR EPISTAXIS CONTROL;  Surgeon: Rozetta Nunnery, MD;  Location: [facility];  Service: ENT;  Laterality: N/A;  . NASAL SINUS SURGERY    . NM MYOVIEW LTD  03/19/2009   normal/ EF- 68%    Review of systems negative except as noted  in HPI / PMHx or noted below:  Review of Systems  Constitutional: Negative.   HENT: Negative.   Eyes: Negative.   Respiratory: Negative.   Cardiovascular: Negative.   Gastrointestinal: Negative.   Genitourinary: Negative.   Musculoskeletal: Negative.   Skin: Negative.   Neurological: Negative.   Endo/Heme/Allergies: Negative.   Psychiatric/Behavioral: Negative.      Objective:   There were no vitals filed for this visit.        Physical Exam-deferred  Diagnostics: none  Assessment and Plan:   1. Other allergic rhinitis     1.  Continue immunotherapy  2.  Continue injectable epinephrine device if required  3.  Continue OTC antihistamine if needed  4.  Return to clinic in 1 year or earlier if problem  5.  Obtain fall flu vaccine (and COVID vaccine)  Overall Dr. Ernesto Rutherford appears to have received significant improvement with his immunotherapy.  He is interested in maintaining this form of therapy at this point in time.  Soon he will be out to every 4-week administration and we will use that dosage interval for 1 year and then we can try every 6-week administration.  I will see him back in this clinic in 1 year or earlier if there is a problem.  Allena Katz, MD Allergy /  Immunology [facility]

## 2019-01-24 NOTE — Patient Instructions (Signed)
  1.  Continue immunotherapy  2.  Continue injectable epinephrine device if required  3.  Continue OTC antihistamine if needed  4.  Return to clinic in 1 year or earlier if problem  5.  Obtain fall flu vaccine (and COVID vaccine)

## 2019-01-25 ENCOUNTER — Encounter: Payer: Self-pay | Admitting: Allergy and Immunology

## 2019-01-30 ENCOUNTER — Ambulatory Visit (INDEPENDENT_AMBULATORY_CARE_PROVIDER_SITE_OTHER): Payer: Medicare Other

## 2019-01-30 DIAGNOSIS — J309 Allergic rhinitis, unspecified: Secondary | ICD-10-CM | POA: Diagnosis not present

## 2019-02-06 ENCOUNTER — Ambulatory Visit (INDEPENDENT_AMBULATORY_CARE_PROVIDER_SITE_OTHER): Payer: Medicare Other

## 2019-02-06 DIAGNOSIS — J309 Allergic rhinitis, unspecified: Secondary | ICD-10-CM

## 2019-02-13 ENCOUNTER — Ambulatory Visit (INDEPENDENT_AMBULATORY_CARE_PROVIDER_SITE_OTHER): Payer: Medicare Other | Admitting: *Deleted

## 2019-02-13 DIAGNOSIS — J309 Allergic rhinitis, unspecified: Secondary | ICD-10-CM | POA: Diagnosis not present

## 2019-02-20 ENCOUNTER — Other Ambulatory Visit: Payer: Self-pay

## 2019-02-20 ENCOUNTER — Encounter (INDEPENDENT_AMBULATORY_CARE_PROVIDER_SITE_OTHER): Payer: Self-pay | Admitting: Otolaryngology

## 2019-02-20 ENCOUNTER — Ambulatory Visit (INDEPENDENT_AMBULATORY_CARE_PROVIDER_SITE_OTHER): Payer: Self-pay | Admitting: Otolaryngology

## 2019-02-20 ENCOUNTER — Telehealth (INDEPENDENT_AMBULATORY_CARE_PROVIDER_SITE_OTHER): Payer: Self-pay

## 2019-02-20 DIAGNOSIS — H61813 Exostosis of external canal, bilateral: Secondary | ICD-10-CM

## 2019-02-20 NOTE — Telephone Encounter (Signed)
Test to see if note go threw. Pt needs appt.

## 2019-02-20 NOTE — Progress Notes (Addendum)
HPI: Harry [name] is a 78 y.o. male who returns today for evaluation of complaints of fullness and itching in his right ear. For the past two weeks, it has gradually gotten worse. He has not noticed a big change in his hearing although he has some long-standing underlying hearing loss that the has known about for years. He denies any pain or drainage from the ear. No history of trauma to the hear. He presents today to have the ear examined.  Past Medical History:  Diagnosis Date  . Allergic rhinitis   . Allergy   . Asthma    50 years ago  . Giardia    Past Surgical History:  Procedure Laterality Date  . abdominal aorta ultrasound  07/22/2009   mild atherosclerotic changes without aneurysm  . COLONOSCOPY    . HAND SURGERY  age 43   for infection  . NASAL HEMORRHAGE CONTROL N/A 01/08/2017   Procedure: MINOR EPISTAXIS CONTROL;  Surgeon: Rozetta Nunnery, [name];  Location: [facility];  Service: ENT;  Laterality: N/A;  . NASAL SINUS SURGERY    . NM MYOVIEW LTD  03/19/2009   normal/ EF- 68%   Social History   Socioeconomic History  . Marital status: Married    Spouse name: Not on file  . Number of children: Not on file  . Years of education: Not on file  . Highest education level: Not on file  Occupational History  . Occupation: ENT surgeon  Social Needs  . Financial resource strain: Not on file  . Food insecurity    Worry: Not on file    Inability: Not on file  . Transportation needs    Medical: Not on file    Non-medical: Not on file  Tobacco Use  . Smoking status: Former Smoker    Types: Pipe  . Smokeless tobacco: Never Used  . Tobacco comment: 40 years ago  Substance and Sexual Activity  . Alcohol use: Yes    Comment: occasionally  . Drug use: No  . Sexual activity: Not on file  Lifestyle  . Physical activity    Days per week: Not on file    Minutes per session: Not on file  . Stress: Not on file  Relationships  . Social Product manager on phone: Not on file    Gets together: Not on file    Attends religious service: Not on file    Active member of club or organization: Not on file    Attends meetings of clubs or organizations: Not on file    Relationship status: Not on file  Other Topics Concern  . Not on file  Social History Narrative  . Not on file   Family History  Problem Relation Age of Onset  . Aortic aneurysm Father 55  . Lung cancer Mother 6  . Allergic rhinitis Other        GM  . Hypertension Brother   . Pulmonary embolism Maternal Grandfather   . Heart attack Neg Hx   . Stroke Neg Hx   . Colon cancer Neg Hx   . Colon polyps Neg Hx   . Esophageal cancer Neg Hx   . Rectal cancer Neg Hx   . Stomach cancer Neg Hx    Allergies  Allergen Reactions  . Apple   . Bee Venom Other (See Comments)    unknown  . Blackberry [Rubus Fruticosus]   . Onion   . Peach Flavor   .  Pear   . Plum Pulp    Prior to Admission medications   Medication Sig Start Date End Date Taking? Authorizing Provider  EPINEPHrine (EPIPEN 2-PAK) 0.3 mg/0.3 mL IJ SOAJ injection Use as directed for life-threatening allergic reaction.    Provider, Historical, [name]  fexofenadine (ALLEGRA) 180 MG tablet Take 180 mg by mouth.    Provider, Historical, [name]  Multiple Vitamins-Minerals (MULTIVITAMIN ADULTS PO) Take by mouth.    Provider, Historical, [name]  NONFORMULARY OR COMPOUNDED ITEM Allergy Vaccine 1:10 Given at [facility]    Provider, Historical, [name]     Positive ROS: No ear drainage noted and no change in hearing and no pain in ear otherwise negative  All other systems have been reviewed and were otherwise negative with the exception of those mentioned in the HPI and as above.  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  Right ear canal reveals multiple osteomas with stenosis of the bony portion of the right ear canal.  Ear canal is  approximately 90% closed from osteomas.  I am barely able to visualize the TM which appears clear.  There are no signs of infection no significant wax buildup he does have dry crusting in the ear canal.  On tuning fork testing Weber was midline.  AC > BC bilaterally. 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  Plan: I discussed with patient concerning possibly removal of the osteomas but he is not interested to have the ostium was removed at this point time.  Concerning the itching in his ears discussed use of Dermotic oil if itching is severe but would otherwise keep the ear canal dry.   [name], [name]

## 2019-03-16 IMAGING — DX DG CHEST 2V
2 series · 2 of 2 positions shown · non-contrast
Comparison: March 25, 2015

CLINICAL DATA: Cough and congestion

EXAM:
CHEST - 2 VIEW

[chest pa]
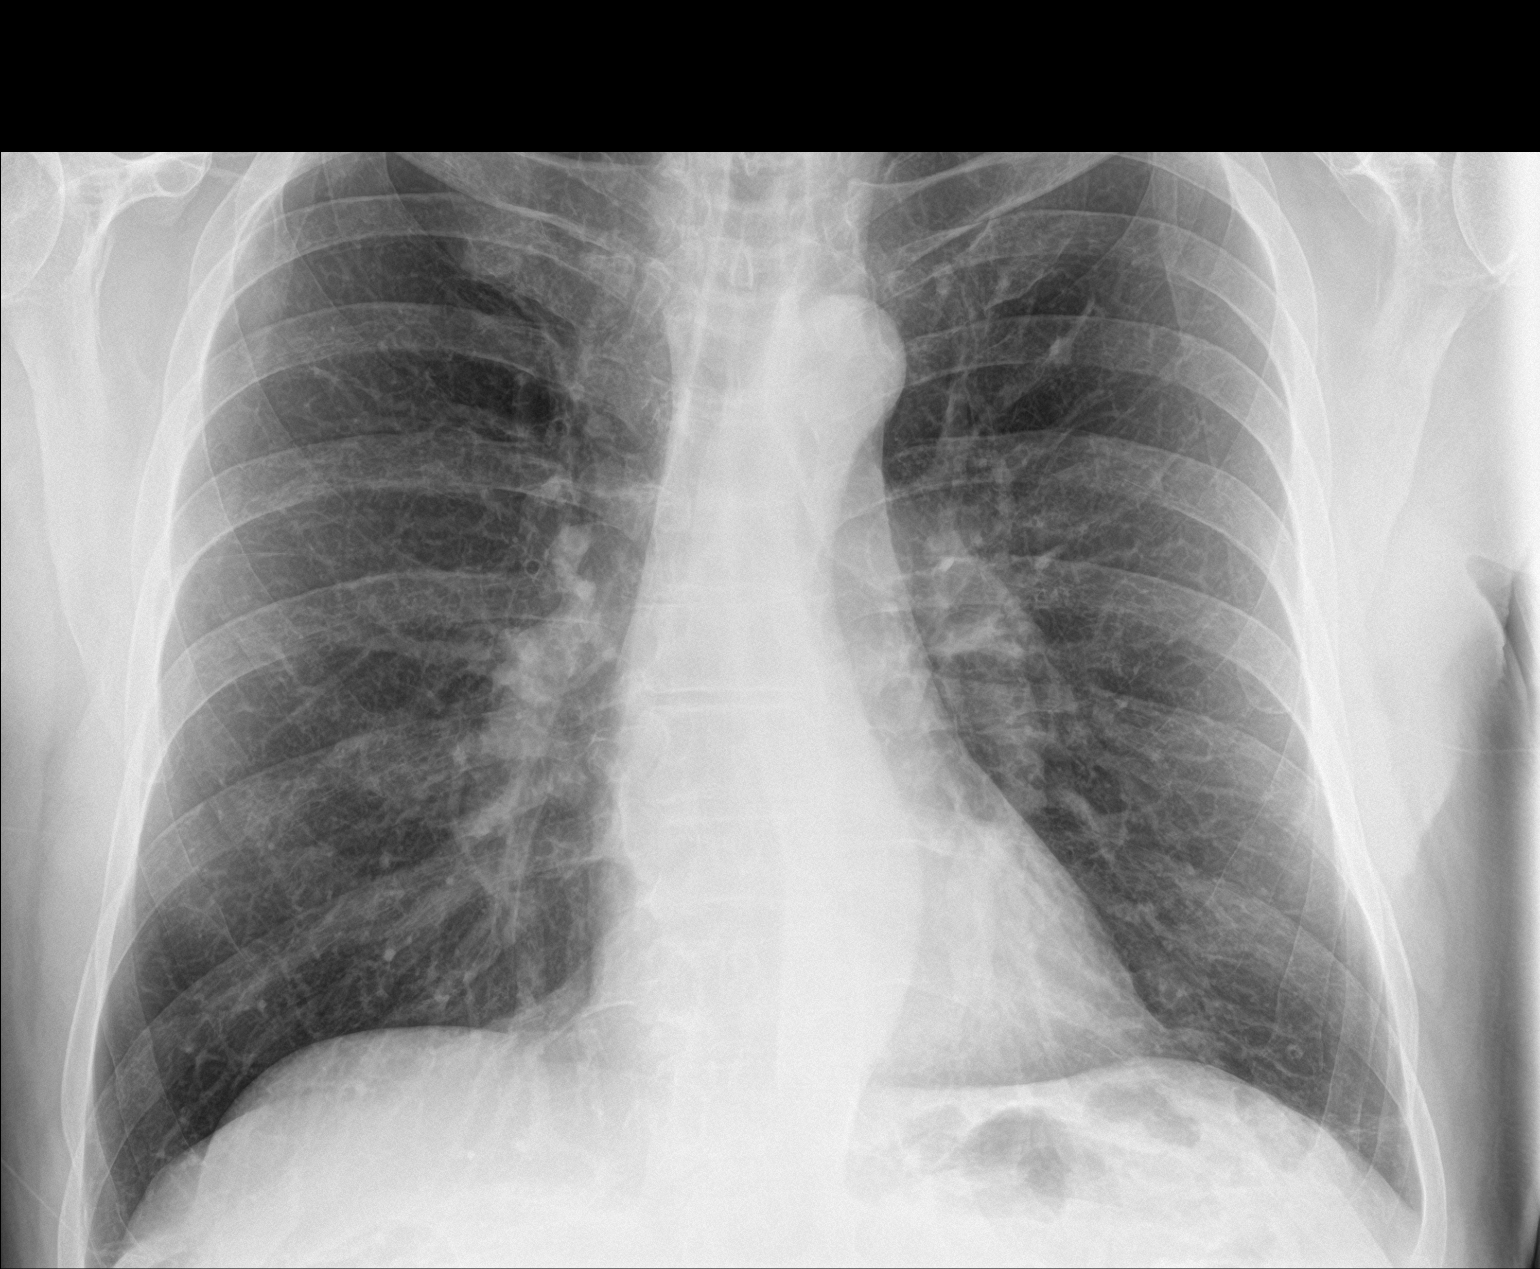

[chest lat]
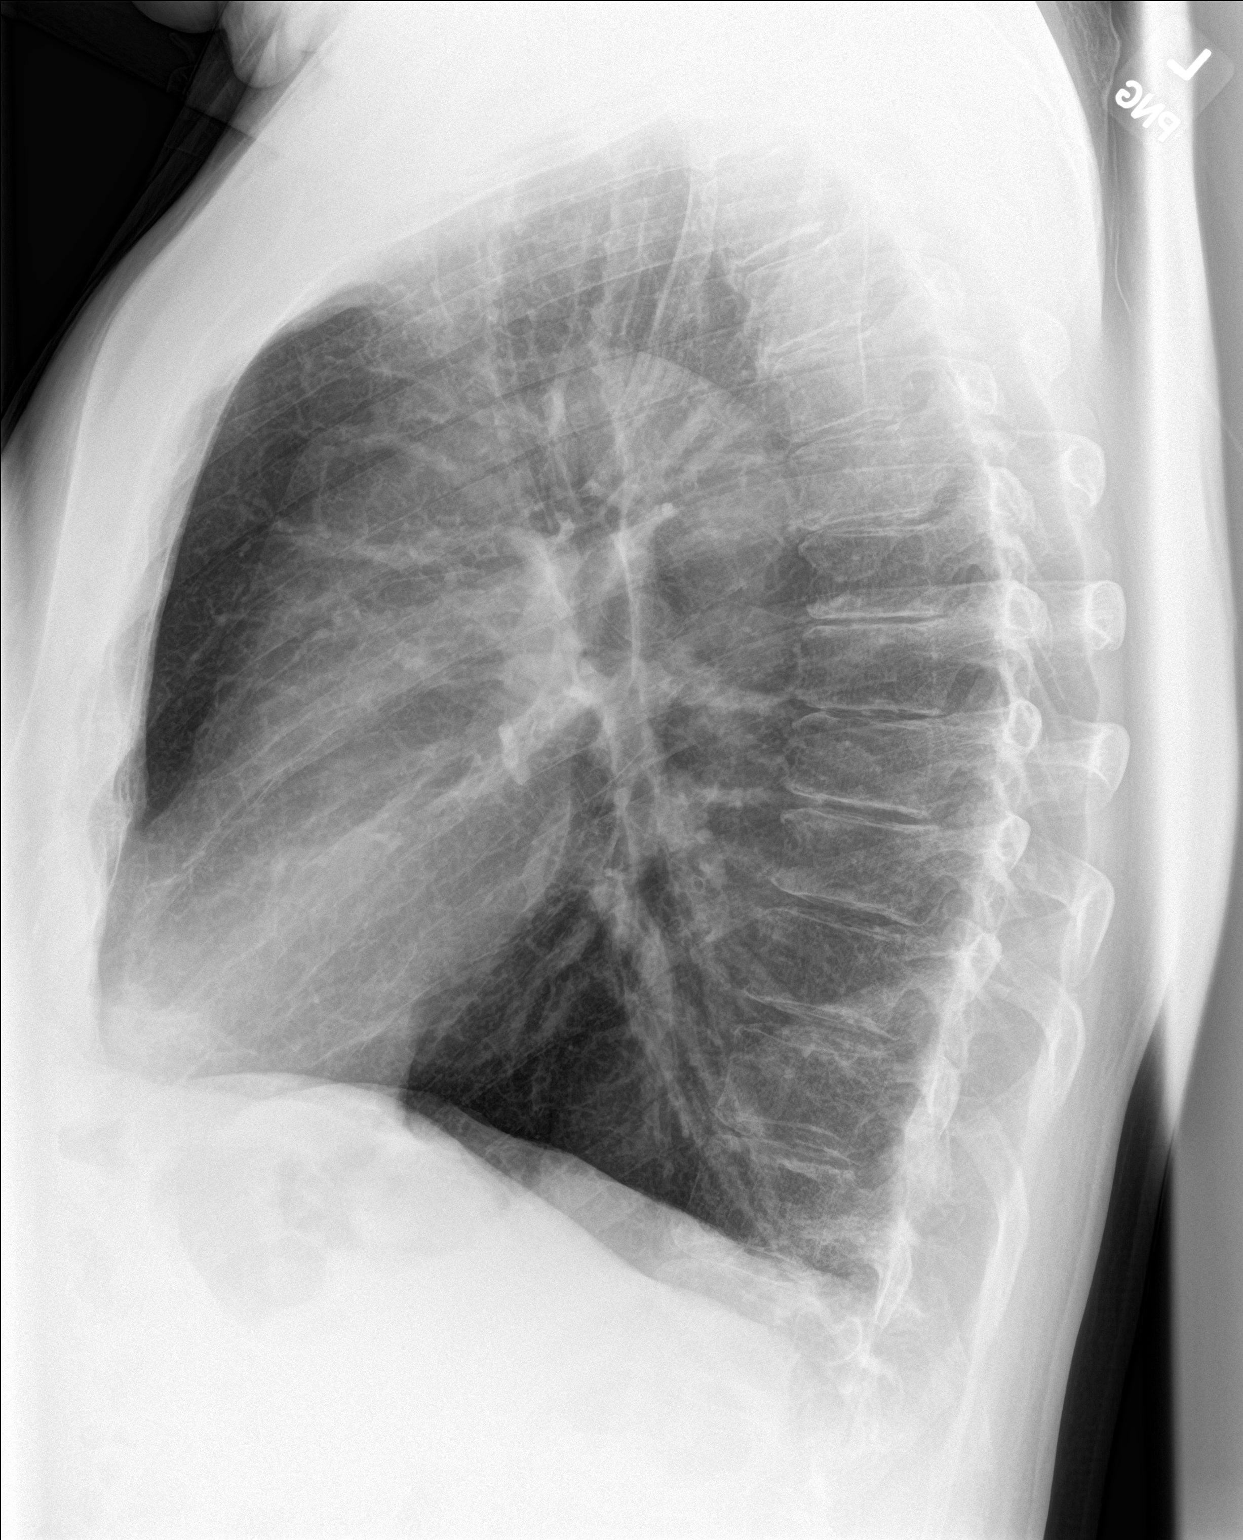

[2 of 2 positions shown; findings below may reference images not displayed]

FINDINGS: There is mild scarring in the left base. There is a 7 mm nodular
opacity in the left upper lobe, not appreciable on previous study.
Lungs elsewhere are clear. Heart size and pulmonary vascularity are
normal. No adenopathy. There is mild degenerative change in the
thoracic spine.
IMPRESSION: 7 mm nodular opacity left upper lobe. Advise correlation with
noncontrast enhanced chest CT to further assess given this change
from 8730 study.

Mild scarring left base. No edema or consolidation. Stable cardiac
silhouette.

These results will be called to the ordering clinician or
representative by the Radiologist Assistant, and communication
documented in the PACS or zVision Dashboard.

## 2019-03-24 ENCOUNTER — Ambulatory Visit (INDEPENDENT_AMBULATORY_CARE_PROVIDER_SITE_OTHER): Payer: Medicare Other | Admitting: *Deleted

## 2019-03-24 DIAGNOSIS — J309 Allergic rhinitis, unspecified: Secondary | ICD-10-CM

## 2019-04-17 IMAGING — MR MR HEAD W/O CM
12 of 13 series · 44 of 48 positions shown · non-contrast
Comparison: None.

CLINICAL DATA: Ataxia.  Dizziness.

EXAM:
MRI HEAD WITHOUT CONTRAST
TECHNIQUE: Multiplanar, multiecho pulse sequences of the brain and surrounding
structures were obtained without intravenous contrast.

[Series 5: DWI · axial · 3.0mm · 0.88mm/px · z∈[-44,+114]mm · 8 of 108 slices shown (1 of 4)]
[im 1/108]
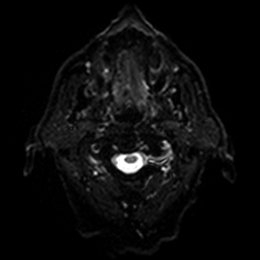
[im 16/108]
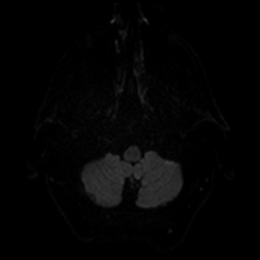
[im 31/108]
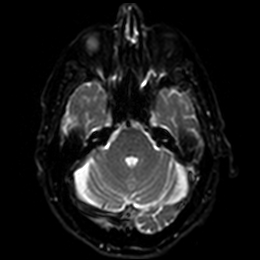
[im 46/108]
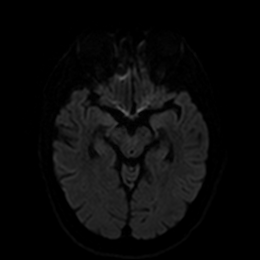
[im 62/108]
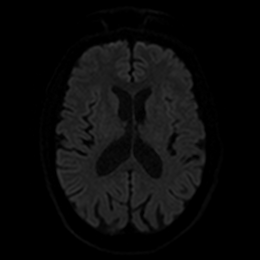
[im 77/108]
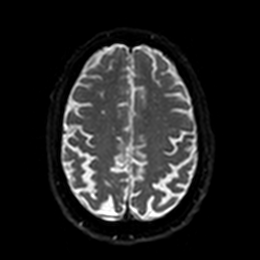
[im 92/108]
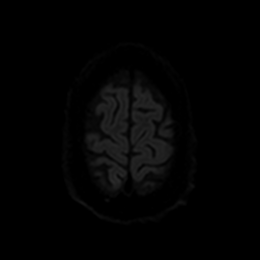
[im 108/108]
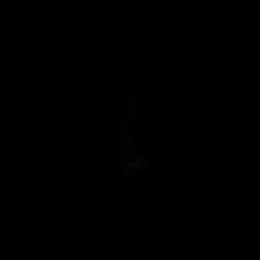

[Series 6: DWI · axial · 3.0mm · 0.88mm/px · z∈[-44,+114]mm · 4 of 54 slices shown (2 of 4)]
[im 1/54]
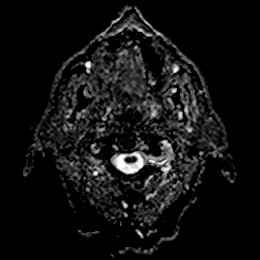
[im 18/54]
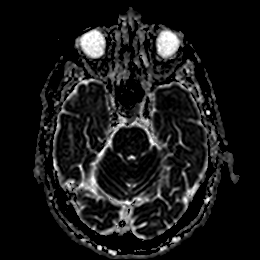
[im 36/54]
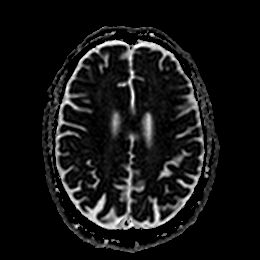
[im 54/54]
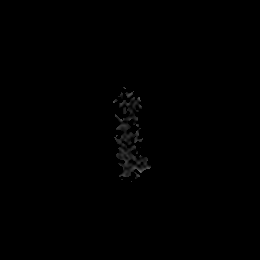

[Series 7: DWI · coronal · 4.0mm · 0.88mm/px · 6 of 80 slices shown (3 of 4)]
[im 1/80]
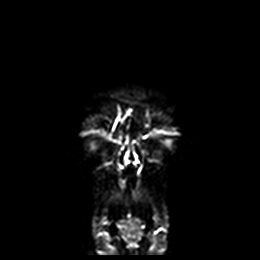
[im 16/80]
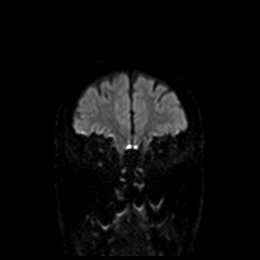
[im 32/80]
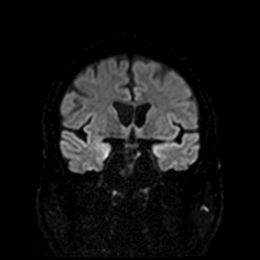
[im 48/80]
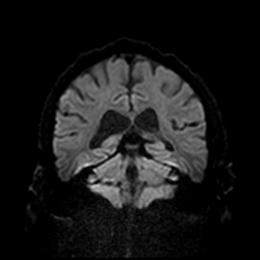
[im 64/80]
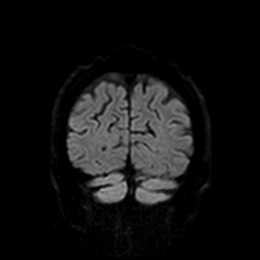
[im 80/80]
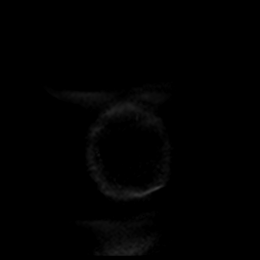

[Series 8: DWI · coronal · 4.0mm · 0.88mm/px · 3 of 40 slices shown (4 of 4)]
[im 1/40]
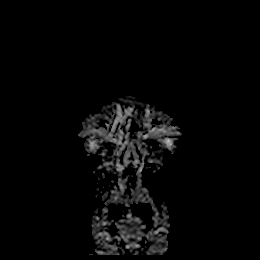
[im 20/40]
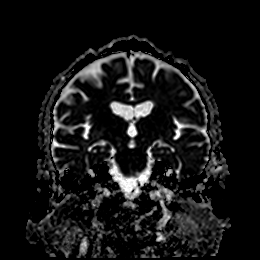
[im 40/40]
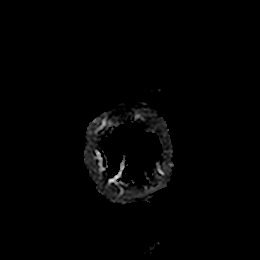

[Series 9: T1 · sagittal · 5.0mm · 0.75mm/px · 2 of 25 slices shown]
[im 1/25]
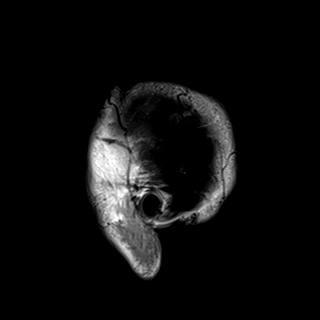
[im 25/25]
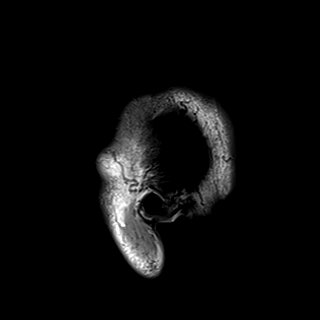

[Series 10: T2 · axial · 5.0mm · 0.72mm/px · z∈[-46,+126]mm · 2 of 30 slices shown (1 of 2)]
[im 1/30]
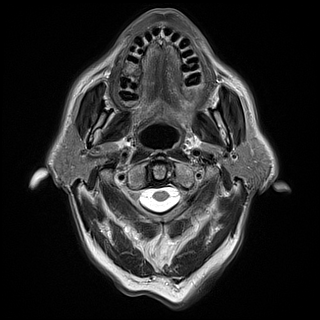
[im 30/30]
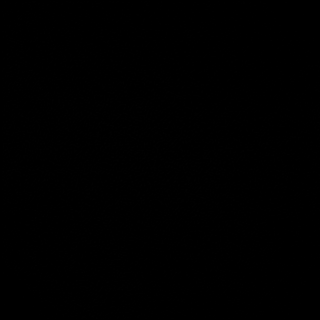

[Series 11: FLAIR · axial · 5.0mm · 0.45mm/px · z∈[-45,+127]mm · 2 of 30 slices shown]
[im 1/30]
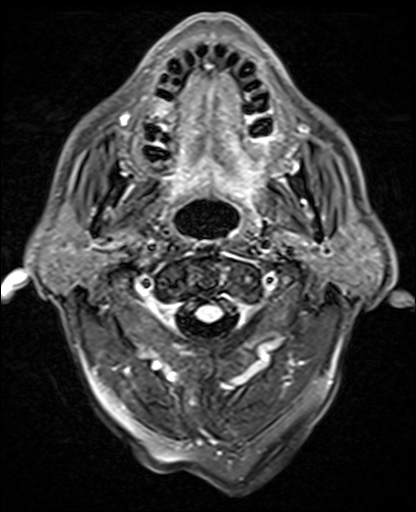
[im 30/30]
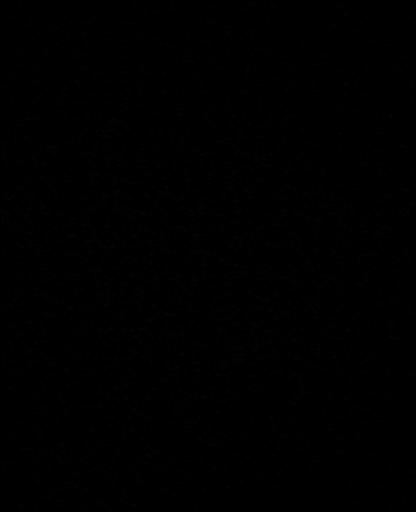

[Series 12: mag_images · axial · 3.0mm · 0.90mm/px · z∈[-40,+112]mm · 4 of 52 slices shown]
[im 1/52]
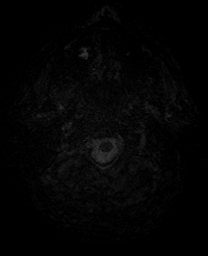
[im 18/52]
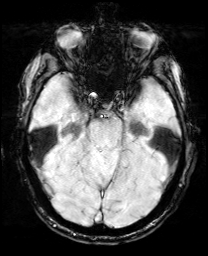
[im 35/52]
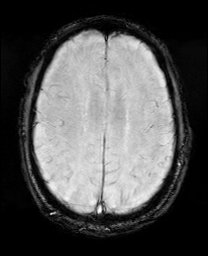
[im 52/52]
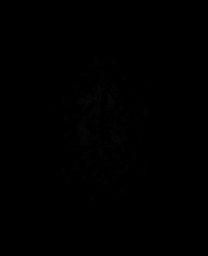

[Series 13: pha_images · axial · 3.0mm · 0.90mm/px · z∈[-40,+112]mm · 4 of 52 slices shown]
[im 1/52]
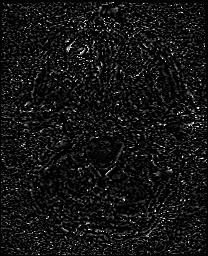
[im 18/52]
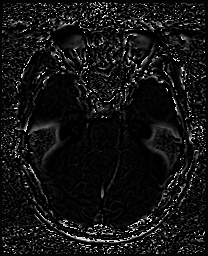
[im 35/52]
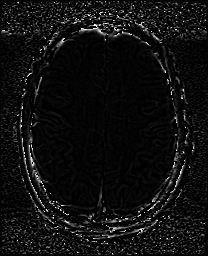
[im 52/52]
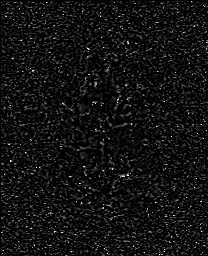

[Series 14: swi_images · axial · 3.0mm · 0.90mm/px · z∈[-40,+112]mm · 4 of 52 slices shown]
[im 1/52]
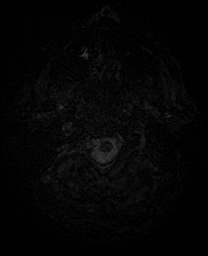
[im 18/52]
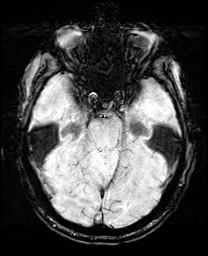
[im 35/52]
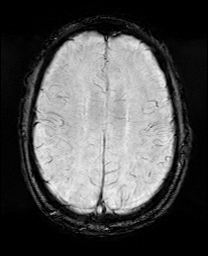
[im 52/52]
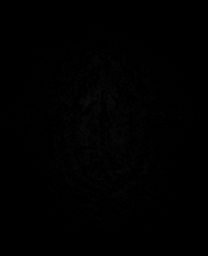

[Series 15: mip_images(sw) · axial · 24.0mm · 0.90mm/px · z∈[-30,+101]mm · 3 of 45 slices shown]
[im 1/45]
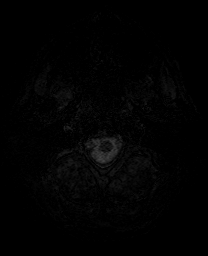
[im 23/45]
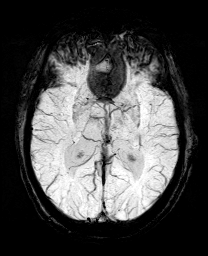
[im 45/45]
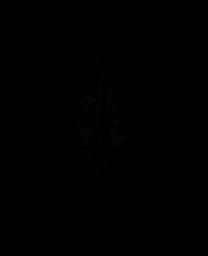

[Series 17: T2 · coronal · 5.0mm · 0.34mm/px · 2 of 30 slices shown (2 of 2)]
[im 1/30]
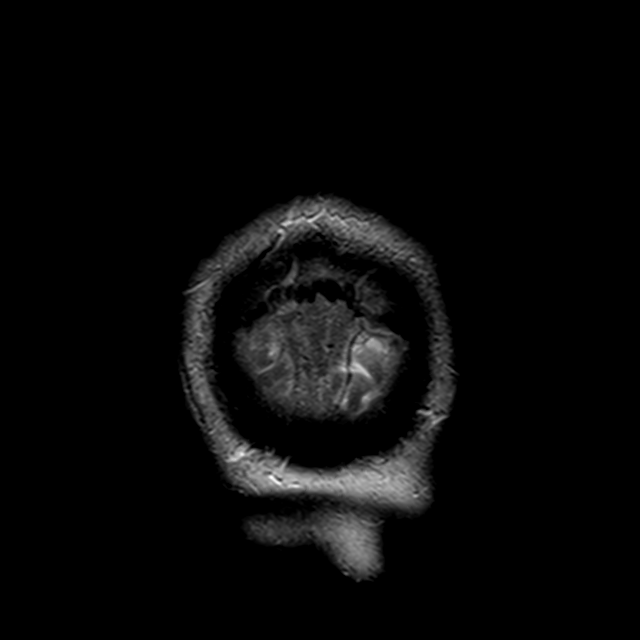
[im 30/30]
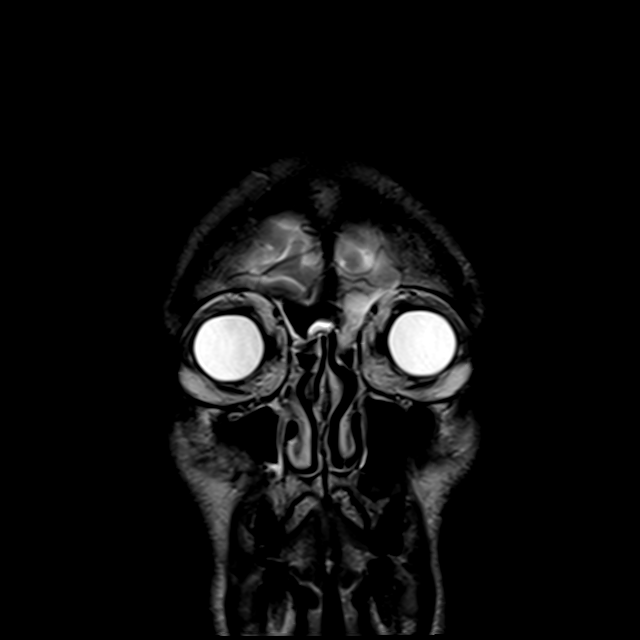

[44 of 48 positions shown; findings below may reference images not displayed]

FINDINGS: Brain: No acute infarct, mass, midline shift, or extra-axial fluid
collection is identified. Chronic microhemorrhages are noted in the
anterior right cerebellum, lower pons, and left superior frontal
gyrus. The ventricles and sulci are within normal limits for age. No
significant white matter disease is seen for age.

Vascular: Major intracranial vascular flow voids are preserved.

Skull and upper cervical spine: Unremarkable bone marrow signal.

Sinuses/Orbits: Mild mucosal thickening in the bilateral frontal,
bilateral ethmoid, right maxillary, and left sphenoid sinuses. No
sinus fluid. Clear mastoid air cells.

Other: None.
IMPRESSION: 1. No acute intracranial abnormality.
2. Few chronic microhemorrhages.

## 2019-04-19 ENCOUNTER — Ambulatory Visit (INDEPENDENT_AMBULATORY_CARE_PROVIDER_SITE_OTHER): Payer: Medicare Other | Admitting: *Deleted

## 2019-04-19 DIAGNOSIS — J309 Allergic rhinitis, unspecified: Secondary | ICD-10-CM

## 2019-05-11 ENCOUNTER — Ambulatory Visit: Payer: Medicare Other | Attending: Internal Medicine

## 2019-05-11 DIAGNOSIS — Z23 Encounter for immunization: Secondary | ICD-10-CM

## 2019-05-11 NOTE — Progress Notes (Signed)
   Covid-19 Vaccination Clinic  Name:  Harry Gilpin MD    MRN: [id number] DOB: 1941/03/01  05/11/2019  Mr. Harry Rutherford MD was observed post Covid-19 immunization for 15 minutes without incidence. He was provided with Vaccine Information Sheet and instruction to access the V-Safe system.   Mr. Harry Rutherford MD was instructed to call 911 with any severe reactions post vaccine: Marland Kitchen Difficulty breathing  . Swelling of your face and throat  . A fast heartbeat  . A bad rash all over your body  . Dizziness and weakness    Immunizations Administered    Name Date Dose VIS Date Route   Pfizer COVID-19 Vaccine 05/11/2019 11:52 AM 0.3 mL 03/31/2019 Intramuscular   Manufacturer: [place]   Lot: [id number]   [place]: [id number]

## 2019-05-15 ENCOUNTER — Ambulatory Visit: Payer: Medicare Other

## 2019-05-22 ENCOUNTER — Telehealth: Payer: Self-pay | Admitting: Gastroenterology

## 2019-05-22 DIAGNOSIS — R197 Diarrhea, unspecified: Secondary | ICD-10-CM

## 2019-05-22 NOTE — Telephone Encounter (Signed)
GI pathogen panel

## 2019-05-22 NOTE — Telephone Encounter (Signed)
Orders placed Left message for patient to call back

## 2019-05-22 NOTE — Telephone Encounter (Signed)
Pt reported that he is experiencing abnormal stool and diarrhea.  He stated that he has been eating fish and some grapes from [place] that he did not wash thoroughly.  He inquires whether this could be due to parasite and requested a stool culture.

## 2019-05-22 NOTE — Telephone Encounter (Signed)
Dr. Fuller Plan, Perry review and advise

## 2019-05-22 NOTE — Telephone Encounter (Signed)
Patient will come pick up the containers

## 2019-05-23 ENCOUNTER — Other Ambulatory Visit: Payer: Medicare Other

## 2019-05-23 ENCOUNTER — Ambulatory Visit (INDEPENDENT_AMBULATORY_CARE_PROVIDER_SITE_OTHER): Payer: Medicare Other

## 2019-05-23 DIAGNOSIS — J309 Allergic rhinitis, unspecified: Secondary | ICD-10-CM | POA: Diagnosis not present

## 2019-05-23 DIAGNOSIS — R197 Diarrhea, unspecified: Secondary | ICD-10-CM

## 2019-05-25 LAB — GASTROINTESTINAL PATHOGEN PANEL PCR
C. difficile Tox A/B, PCR: NOT DETECTED
Campylobacter, PCR: NOT DETECTED
Cryptosporidium, PCR: NOT DETECTED
E coli (ETEC) LT/ST PCR: NOT DETECTED
E coli (STEC) stx1/stx2, PCR: NOT DETECTED
E coli 0157, PCR: NOT DETECTED
Giardia lamblia, PCR: NOT DETECTED
Norovirus, PCR: NOT DETECTED
Rotavirus A, PCR: NOT DETECTED
Salmonella, PCR: NOT DETECTED
Shigella, PCR: NOT DETECTED

## 2019-05-31 ENCOUNTER — Ambulatory Visit: Payer: Medicare Other | Attending: Internal Medicine

## 2019-05-31 DIAGNOSIS — Z23 Encounter for immunization: Secondary | ICD-10-CM | POA: Insufficient documentation

## 2019-05-31 NOTE — Progress Notes (Signed)
   Covid-19 Vaccination Clinic  Name:  [name] MD    MRN: [id number] DOB: 1940-08-23  05/31/2019  Mr. Harry Rutherford MD was observed post Covid-19 immunization for 15 minutes without incidence. He was provided with Vaccine Information Sheet and instruction to access the V-Safe system.   Mr. Harry Rutherford MD was instructed to call 911 with any severe reactions post vaccine: Marland Kitchen Difficulty breathing  . Swelling of your face and throat  . A fast heartbeat  . A bad rash all over your body  . Dizziness and weakness    Immunizations Administered    Name Date Dose VIS Date Route   Pfizer COVID-19 Vaccine 05/31/2019  5:19 PM 0.3 mL 03/31/2019 Intramuscular   Manufacturer: [place]   Lot: [id number]   [place]: [id number]

## 2019-06-13 ENCOUNTER — Ambulatory Visit (INDEPENDENT_AMBULATORY_CARE_PROVIDER_SITE_OTHER): Payer: Medicare Other

## 2019-06-13 DIAGNOSIS — J309 Allergic rhinitis, unspecified: Secondary | ICD-10-CM

## 2019-06-14 ENCOUNTER — Ambulatory Visit (INDEPENDENT_AMBULATORY_CARE_PROVIDER_SITE_OTHER): Payer: Medicare Other | Admitting: Otolaryngology

## 2019-06-14 ENCOUNTER — Other Ambulatory Visit: Payer: Self-pay

## 2019-06-14 VITALS — Temp 97.5°F

## 2019-06-14 DIAGNOSIS — H6123 Impacted cerumen, bilateral: Secondary | ICD-10-CM | POA: Diagnosis not present

## 2019-06-14 DIAGNOSIS — H61301 Acquired stenosis of right external ear canal, unspecified: Secondary | ICD-10-CM | POA: Diagnosis not present

## 2019-06-14 NOTE — Progress Notes (Signed)
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  He has history of severe right external ear canal exostosis..  Past Medical History:  Diagnosis Date  . Allergic rhinitis   . Allergy   . Asthma    50 years ago  . Giardia    Past Surgical History:  Procedure Laterality Date  . abdominal aorta ultrasound  07/22/2009   mild atherosclerotic changes without aneurysm  . COLONOSCOPY    . HAND SURGERY  age 58   for infection  . NASAL HEMORRHAGE CONTROL N/A 01/08/2017   Procedure: MINOR EPISTAXIS CONTROL;  Surgeon: Rozetta Nunnery, MD;  Location: [facility];  Service: ENT;  Laterality: N/A;  . NASAL SINUS SURGERY    . NM MYOVIEW LTD  03/19/2009   normal/ EF- 68%   Social History   Socioeconomic History  . Marital status: Married    Spouse name: Not on file  . Number of children: Not on file  . Years of education: Not on file  . Highest education level: Not on file  Occupational History  . Occupation: ENT surgeon  Tobacco Use  . Smoking status: Former Smoker    Types: Pipe  . Smokeless tobacco: Never Used  . Tobacco comment: 40 years ago  Substance and Sexual Activity  . Alcohol use: Yes    Comment: occasionally  . Drug use: No  . Sexual activity: Not on file  Other Topics Concern  . Not on file  Social History Narrative  . Not on file   Social Determinants of Health   Financial Resource Strain:   . Difficulty of Paying Living Expenses: Not on file  Food Insecurity:   . Worried About Charity fundraiser in the Last Year: Not on file  . Ran Out of Food in the Last Year: Not on file  Transportation Needs:   . Lack of Transportation (Medical): Not on file  . Lack of Transportation (Non-Medical): Not on file  Physical Activity:   . Days of Exercise per Week: Not on file  . Minutes of Exercise per Session: Not on file  Stress:   . Feeling of Stress : Not on file  Social  Connections:   . Frequency of Communication with Friends and Family: Not on file  . Frequency of Social Gatherings with Friends and Family: Not on file  . Attends Religious Services: Not on file  . Active Member of Clubs or Organizations: Not on file  . Attends Archivist Meetings: Not on file  . Marital Status: Not on file   Family History  Problem Relation Age of Onset  . Aortic aneurysm Father 40  . Lung cancer Mother 54  . Allergic rhinitis Other        GM  . Hypertension Brother   . Pulmonary embolism Maternal Grandfather   . Heart attack Neg Hx   . Stroke Neg Hx   . Colon cancer Neg Hx   . Colon polyps Neg Hx   . Esophageal cancer Neg Hx   . Rectal cancer Neg Hx   . Stomach cancer Neg Hx    Allergies  Allergen Reactions  . Apple   . Bee Venom Other (See Comments)    unknown  . Blackberry [Rubus Fruticosus]   . Onion   . Peach Flavor   . Pear   . Plum Pulp    Prior to Admission  medications   Medication Sig Start Date End Date Taking? Authorizing Provider  EPINEPHrine (EPIPEN 2-PAK) 0.3 mg/0.3 mL IJ SOAJ injection Use as directed for life-threatening allergic reaction.   Yes [provider]  fexofenadine (ALLEGRA) 180 MG tablet Take 180 mg by mouth.   Yes [provider]  Multiple Vitamins-Minerals (MULTIVITAMIN ADULTS PO) Take by mouth.   Yes [provider]  NONFORMULARY OR COMPOUNDED ITEM Allergy Vaccine 1:10 Given at [facility]   Yes [provider]     Positive ROS: Otherwise negative  All other systems have been reviewed and were otherwise negative with the exception of those mentioned in the HPI and as above.  Physical Exam: Constitutional: Alert, well-appearing, no acute distress Ears: External ears without lesions or tenderness. Ear canals again noted is severe right external ear canal exostosis with only perhaps 1 to 3 mm of clearance and visualization of the TM which is very limited.  He had only a  small amount of wax that was removed with forceps.. Nasal: External nose without lesions. Clear nasal passages Oral: Oropharynx clear. Neck: No palpable adenopathy or masses Respiratory: Breathing comfortably  Skin: No facial/neck lesions or rash noted.  Cerumen impaction removal  Date/Time: 06/14/2019 11:59 AM Performed by: Rozetta Nunnery, MD Authorized by: Rozetta Nunnery, MD   Consent:    Consent obtained:  Verbal   Consent given by:  Patient   Risks discussed:  Pain and bleeding Procedure details:    Location:  L ear and R ear   Procedure type: curette and forceps   Post-procedure details:    Inspection:  TM intact and canal normal   Hearing quality:  Improved   Patient tolerance of procedure:  Tolerated well, no immediate complications Comments:     Patient with severe right external ear canal exostosis.  Minimal wax buildup.    Assessment: Cerumen buildup Right ear canal stenosis  Plan: He will follow-up as needed. He is not interested in surgery.  [name], MD

## 2019-06-21 DIAGNOSIS — J301 Allergic rhinitis due to pollen: Secondary | ICD-10-CM | POA: Diagnosis not present

## 2019-06-22 DIAGNOSIS — J3089 Other allergic rhinitis: Secondary | ICD-10-CM | POA: Diagnosis not present

## 2019-06-22 NOTE — Progress Notes (Signed)
VIALS EXP 06-21-20

## 2019-07-11 ENCOUNTER — Ambulatory Visit (INDEPENDENT_AMBULATORY_CARE_PROVIDER_SITE_OTHER): Payer: Medicare Other

## 2019-07-11 DIAGNOSIS — J309 Allergic rhinitis, unspecified: Secondary | ICD-10-CM | POA: Diagnosis not present

## 2019-09-05 ENCOUNTER — Ambulatory Visit (INDEPENDENT_AMBULATORY_CARE_PROVIDER_SITE_OTHER): Payer: Medicare Other

## 2019-09-05 DIAGNOSIS — J309 Allergic rhinitis, unspecified: Secondary | ICD-10-CM

## 2019-09-11 ENCOUNTER — Ambulatory Visit (INDEPENDENT_AMBULATORY_CARE_PROVIDER_SITE_OTHER): Payer: Medicare Other

## 2019-09-11 DIAGNOSIS — J309 Allergic rhinitis, unspecified: Secondary | ICD-10-CM

## 2019-09-19 ENCOUNTER — Ambulatory Visit (INDEPENDENT_AMBULATORY_CARE_PROVIDER_SITE_OTHER): Payer: Medicare Other

## 2019-09-19 DIAGNOSIS — J309 Allergic rhinitis, unspecified: Secondary | ICD-10-CM | POA: Diagnosis not present

## 2019-09-25 ENCOUNTER — Ambulatory Visit (INDEPENDENT_AMBULATORY_CARE_PROVIDER_SITE_OTHER): Payer: Medicare Other

## 2019-09-25 DIAGNOSIS — J309 Allergic rhinitis, unspecified: Secondary | ICD-10-CM | POA: Diagnosis not present

## 2019-10-10 ENCOUNTER — Ambulatory Visit (INDEPENDENT_AMBULATORY_CARE_PROVIDER_SITE_OTHER): Payer: Medicare Other

## 2019-10-10 DIAGNOSIS — J309 Allergic rhinitis, unspecified: Secondary | ICD-10-CM | POA: Diagnosis not present

## 2019-10-30 ENCOUNTER — Telehealth: Payer: Self-pay

## 2019-10-30 ENCOUNTER — Ambulatory Visit (INDEPENDENT_AMBULATORY_CARE_PROVIDER_SITE_OTHER): Payer: Medicare Other

## 2019-10-30 ENCOUNTER — Ambulatory Visit
Admission: RE | Admit: 2019-10-30 | Discharge: 2019-10-30 | Disposition: A | Discharge status: Home / Self Care | Payer: Medicare Other | Source: Ambulatory Visit | Attending: Allergy and Immunology | Admitting: Allergy and Immunology

## 2019-10-30 DIAGNOSIS — R059 Cough, unspecified: Secondary | ICD-10-CM

## 2019-10-30 DIAGNOSIS — J309 Allergic rhinitis, unspecified: Secondary | ICD-10-CM | POA: Diagnosis not present

## 2019-10-30 NOTE — Telephone Encounter (Signed)
Patient believes he may have or have had bronchitis and since it has been so long since he had a chest xray wanted to get one done. Patient declined appointment at this time.

## 2019-10-30 NOTE — Telephone Encounter (Signed)
Please inform Dr. Ernesto Rutherford that we can order a chest x-ray at [facility] for cough. As well, he can also be seen in our clinic today or tomorrow or sometime this week if he does not feel as though he is doing well.

## 2019-10-30 NOTE — Telephone Encounter (Signed)
Patient came in c/o coughing and mucus issues lasting around 3 days ago. He had his wife listen to his lungs and wanted to know if Dr. Neldon Mc could order a Chest x-ray at [facility]. Please Advise

## 2019-11-06 ENCOUNTER — Ambulatory Visit (INDEPENDENT_AMBULATORY_CARE_PROVIDER_SITE_OTHER): Payer: Medicare Other

## 2019-11-06 DIAGNOSIS — J309 Allergic rhinitis, unspecified: Secondary | ICD-10-CM | POA: Diagnosis not present

## 2019-11-27 ENCOUNTER — Ambulatory Visit (INDEPENDENT_AMBULATORY_CARE_PROVIDER_SITE_OTHER): Payer: Medicare Other | Admitting: *Deleted

## 2019-11-27 DIAGNOSIS — J309 Allergic rhinitis, unspecified: Secondary | ICD-10-CM | POA: Diagnosis not present

## 2019-12-27 ENCOUNTER — Encounter (INDEPENDENT_AMBULATORY_CARE_PROVIDER_SITE_OTHER): Payer: Self-pay

## 2019-12-27 NOTE — Progress Notes (Unsigned)
On 10/31/2019 I faxed in Rx refill for Cipro/Dexameth 0.3-0.1%. Instill 4 to 5 drops into affected ear as needed for pain.

## 2020-01-21 NOTE — Progress Notes (Signed)
Cardiology Office Note:   Date:  01/23/2020  NAME:  Anddy Wingert MD    MRN: [id number] DOB:  18-Aug-1940   PCP:  Crist Infante, MD  Cardiologist:  No primary care provider on file.   Referring MD: Crist Infante, MD   Chief Complaint  Patient presents with  . 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 male with a hx of asthma who is being seen today for the evaluation of shortness of breath at the request of Crist Infante, MD. Recent CXR normal.  He reports over the last several weeks he has begun to get progressively short of breath with exertion.  He reports he was on a fishing trip in [place] last week and had to lift a heavy fish out of the water.  He reports he was just drained and exhausted after this.  He reports he can get short of breath when he climbs 2-3 flights of stairs.  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  He reports he does miss working.  He reports he does not get good sleep.  He is up going to the bathroom most nights.  He does have plans to see urology.  He reports he does not get routine exertional chest pain or pressure.  He has had right-sided pain in his chest.  Described as tightness.  This can occur when he exerts himself as well.  He apparently is able to walk on flat surfaces.  He can walk up to 2 miles without any major symptoms with his wife in the evenings.  He has no major medical problems.  He used to smoke a pipe.  His other complaints include a sensation of palpitations.  He reports most days per week he can get a sensation of rapid heartbeat.  He reports the heart is beating irregularly.  Can happen at any time without identifiable triggers.  Goes away without any alleviating factors that are done.  No treatments have been pursued.  He does query if he has atrial fibrillation.  His EKG today demonstrates normal sinus rhythm with questionable old anteroseptal infarct.  He does have a single PAC  noted.  No documented A. fib.  Most recent cholesterol profile this year from his primary care physician demonstrate total cholesterol 185, HDL 61, LDL 114.  TSH 3.04.  He is never had a heart attack or stroke.  Father had a AAA.  He has been screened for this and was negative.  No strong history of heart attack or stroke.  He consumes alcohol in moderation.  No illicit drug use.  He is a father of 3 daughters.  Has 8 grandchildren.  Past Medical History: Past Medical History:  Diagnosis Date  . Allergic rhinitis   . Allergy   . Asthma    50 years ago  . Giardia     Past Surgical History: Past Surgical History:  Procedure Laterality Date  . abdominal aorta ultrasound  07/22/2009   mild atherosclerotic changes without aneurysm  . COLONOSCOPY    . HAND SURGERY  age 39   for infection  . NASAL HEMORRHAGE CONTROL N/A 01/08/2017   Procedure: MINOR EPISTAXIS CONTROL;  Surgeon: Rozetta Nunnery, MD;  Location: [place];  Service: ENT;  Laterality: N/A;  . NASAL SINUS SURGERY    . NM MYOVIEW LTD  03/19/2009   normal/ EF- 68%    Current Medications:  Current Meds  Medication Sig  . EPINEPHrine (EPIPEN 2-PAK) 0.3 mg/0.3 mL IJ SOAJ injection Use as directed for life-threatening allergic reaction.  . fexofenadine (ALLEGRA) 180 MG tablet Take 180 mg by mouth.  . Multiple Vitamins-Minerals (MULTIVITAMIN ADULTS PO) Take by mouth.  . NONFORMULARY OR COMPOUNDED ITEM Allergy Vaccine 1:10 Given at [facility]   Current Facility-Administered Medications for the 01/23/20 encounter (Office Visit) with Geralynn Rile, MD  Medication  . 0.9 %  sodium chloride infusion     Allergies:    Apple, Bee venom, Blackberry [rubus fruticosus], Onion, Peach flavor, Pear, and Plum pulp   Social History: Social History   Socioeconomic History  . Marital status: Married    Spouse name: Not on file  . Number of children: 3  . Years of education: Not on file  . Highest  education level: Not on file  Occupational History  . Occupation: ENT surgeon  Tobacco Use  . Smoking status: Former Smoker    Types: Pipe  . Smokeless tobacco: Never Used  . Tobacco comment: 40 years ago  Substance and Sexual Activity  . Alcohol use: Yes    Comment: occasionally  . Drug use: No  . Sexual activity: Not on file  Other Topics Concern  . Not on file  Social History Narrative  . Not on file   Social Determinants of Health   Financial Resource Strain:   . Difficulty of Paying Living Expenses: Not on file  Food Insecurity:   . Worried About Charity fundraiser in the Last Year: Not on file  . Ran Out of Food in the Last Year: Not on file  Transportation Needs:   . Lack of Transportation (Medical): Not on file  . Lack of Transportation (Non-Medical): Not on file  Physical Activity:   . Days of Exercise per Week: Not on file  . Minutes of Exercise per Session: Not on file  Stress:   . Feeling of Stress : Not on file  Social Connections:   . Frequency of Communication with Friends and Family: Not on file  . Frequency of Social Gatherings with Friends and Family: Not on file  . Attends Religious Services: Not on file  . Active Member of Clubs or Organizations: Not on file  . Attends Archivist Meetings: Not on file  . Marital Status: Not on file     Family History: The patient's family history includes Allergic rhinitis in an other family member; Aortic aneurysm (age of onset: 64) in his father; Hypertension in his brother; Lung cancer (age of onset: 4) in his mother; Pulmonary embolism in his maternal grandfather. There is no history of Heart attack, Stroke, Colon cancer, Colon polyps, Esophageal cancer, Rectal cancer, or Stomach cancer.  ROS:   All other ROS reviewed and negative. Pertinent positives noted in the HPI.     EKGs/Labs/Other Studies Reviewed:   The following studies were personally reviewed by me today:  EKG:  EKG is ordered today.   The ekg ordered today demonstrates normal sinus rhythm, heart rate 67, single PAC noted, old septal infarct, and was personally reviewed by me.   Recent Labs: No results found for requested labs within last 8760 hours.   Recent Lipid Panel    Component Value Date/Time   CHOL 206 (H) 05/17/2012 0817   TRIG 68.0 05/17/2012 0817   HDL 46.80 05/17/2012 0817   CHOLHDL 4 05/17/2012 0817   VLDL 13.6 05/17/2012 0817   LDLDIRECT 145.5 05/17/2012  0817    Physical Exam:   VS:  BP 120/66   Pulse 67   Resp (!) 95   Ht $R'6\' 3"'tD$  (1.905 m)   Wt 205 lb 3.2 oz (93.1 kg)   BMI 25.65 kg/m    Wt Readings from Last 3 Encounters:  01/23/20 205 lb 3.2 oz (93.1 kg)  04/21/18 211 lb 9.6 oz (96 kg)  07/12/17 211 lb (95.7 kg)    General: Well nourished, well developed, in no acute distress Heart: Atraumatic, normal size  Eyes: PEERLA, EOMI  Neck: Supple, no JVD Endocrine: No thryomegaly Cardiac: Normal S1, S2; RRR; no murmurs, rubs, or gallops Lungs: Clear to auscultation bilaterally, no wheezing, rhonchi or rales  Abd: Soft, nontender, no hepatomegaly  Ext: No edema, pulses 2+ Musculoskeletal: No deformities, BUE and BLE strength normal and equal Skin: Warm and dry, no rashes   Neuro: Alert and oriented to person, place, time, and situation, CNII-XII grossly intact, no focal deficits  Psych: Normal mood and affect   ASSESSMENT:   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 Chest pain, unspecified type   2. SOB (shortness of breath) on exertion   3. Palpitations   4. PAC (premature atrial contraction)   5. Abnormal electrocardiogram (ECG) (EKG)    6. Abnormal findings on diagnostic imaging of heart and coronary circulation      PLAN:   1. Chest pain, unspecified type 2. SOB (shortness of breath) on exertion -Symptoms of shortness of breath and exertional chest pressure.  It is right-sided pain.  EKG today shows no acute ischemic changes but questionable old  septal infarct.  He also has PACs noted.  He does have a family history of AAA.  Brain MR demonstrated chronic microhemorrhages in the past.  Apparently this was all normal for his age.  Also has a former history of type tobacco use.  He quit years ago.  Given his new onset symptoms and atypical chest pain I have recommended coronary CTA.  We will pursue 50 mg of metoprolol tartrate 2 after the scan.  He will need a BMP as well as BNP today.  He has no evidence of heart failure.  His cardiovascular exam is normal without murmurs.  I think a BNP will exclude heart failure.  His most recent lipid profile shows an LDL of 114 so he is at risk for CAD.   3. Palpitations -Daily palpitations.  PAC noted on EKG.  We will pursue 7-day Zio patch.  TSH was 3.04 PCP office.  We need to exclude atrial fibrillation.  4. PAC (premature atrial contraction) -Zio patch as above.   Disposition: Return in about 3 months (around 04/24/2020).  Medication Adjustments/Labs and Tests Ordered: Current medicines are reviewed at length with the patient today.  Concerns regarding medicines are outlined above.  Orders Placed This Encounter  Procedures  . CT CORONARY MORPH W/CTA COR W/SCORE W/CA W/CM &/OR WO/CM  . CT CORONARY FRACTIONAL FLOW RESERVE DATA PREP  . CT CORONARY FRACTIONAL FLOW RESERVE FLUID ANALYSIS  . Basic metabolic panel  . Brain natriuretic peptide  . LONG TERM MONITOR (3-14 DAYS)  . EKG 12-Lead   Meds ordered this encounter  Medications  . metoprolol tartrate (LOPRESSOR) 50 MG tablet    Sig: Take 1 tablet by mouth once for procedure.    Dispense:  1 tablet    Refill:  0    Patient Instructions  Medication Instructions:  Take Metoprolol  50 mg two hours before CT when scheduled.   *If you need a refill on your cardiac medications before your next appointment, please call your pharmacy*   Lab Work: BMET, BNP today   If you have labs (blood work) drawn today and your tests are completely  normal, you will receive your results only by: Marland Kitchen MyChart Message (if you have MyChart) OR . A paper copy in the mail If you have any lab test that is abnormal or we need to change your treatment, we will call you to review the results.   Testing/Procedures:  Your physician has requested that you have cardiac CT. Cardiac computed tomography (CT) is a painless test that uses an x-ray machine to take clear, detailed pictures of your heart. For further information please visit HugeFiesta.tn. Please follow instruction sheet as given.  Follow-Up: At [facility], you and your health needs are our priority.  As part of our continuing mission to provide you with exceptional heart care, we have created designated Provider Care Teams.  These Care Teams include your primary Cardiologist (physician) and Advanced Practice Providers (APPs -  Physician Assistants and Nurse Practitioners) who all work together to provide you with the care you need, when you need it.  We recommend signing up for the patient portal called "MyChart".  Sign up information is provided on this After Visit Summary.  MyChart is used to connect with patients for Virtual Visits (Telemedicine).  Patients are able to view lab/test results, encounter notes, upcoming appointments, etc.  Non-urgent messages can be sent to your provider as well.   To learn more about what you can do with MyChart, go to [email or web address].    Your next appointment:   3 month(s)  The format for your next appointment:   In Person  Provider:   Eleonore Chiquito, MD   Other Instructions Your cardiac CT will be scheduled at one of the below locations:   [facility] [address] [place], [place] [address] [phone]  scheduled at [facility], please arrive at the [facility] main entrance of [facility] 30 minutes prior to test start time. Proceed to the [facility] Radiology Department (first floor) to check-in  and test prep.   Please follow these instructions carefully (unless otherwise directed):  Hold all erectile dysfunction medications at least 3 days (72 hrs) prior to test.  On the Night Before the Test: . Be sure to Drink plenty of water. . Do not consume any caffeinated/decaffeinated beverages or chocolate 12 hours prior to your test. . Do not take any antihistamines 12 hours prior to your test.  On the Day of the Test: . Drink plenty of water. Do not drink any water within one hour of the test. . Do not eat any food 4 hours prior to the test. . You may take your regular medications prior to the test.  . Take metoprolol (Lopressor) two hours prior to test. . HOLD Furosemide/Hydrochlorothiazide morning of the test. . FEMALES- please wear underwire-free bra if available      After the Test: . Drink plenty of water. . After receiving IV contrast, you may experience a mild flushed feeling. This is normal. . On occasion, you may experience a mild rash up to 24 hours after the test. This is not dangerous. If this occurs, you can take Benadryl 25 mg and increase your fluid intake. . If you experience trouble breathing, this can be serious. If it is severe call  911 IMMEDIATELY. If it is mild, please call our office. . If you take any of these medications: Glipizide/Metformin, Avandament, Glucavance, please do not take 48 hours after completing test unless otherwise instructed.   Once we have confirmed authorization from your insurance company, we will call you to set up a date and time for your test. Based on how quickly your insurance processes prior authorizations requests, please allow up to 4 weeks to be contacted for scheduling your Cardiac CT appointment. Be advised that routine Cardiac CT appointments could be scheduled as many as 8 weeks after your provider has ordered it.  For non-scheduling related questions, please contact the cardiac imaging nurse navigator should you have any  questions/concerns: Marchia Bond, Cardiac Imaging Nurse Navigator Burley Saver, Interim Cardiac Imaging Nurse [place] and Vascular Services Direct Office Dial: [phone]   For scheduling needs, including cancellations and rescheduling, please call Vivien Rota at [phone], option 3.         Signed, Addison Naegeli. Audie Box, [place]  [address] [place], [place] [address] [phone]  01/23/2020 8:43 AM

## 2020-01-23 ENCOUNTER — Other Ambulatory Visit: Payer: Self-pay | Admitting: Cardiovascular Disease

## 2020-01-23 ENCOUNTER — Encounter: Payer: Self-pay | Admitting: Cardiovascular Disease

## 2020-01-23 ENCOUNTER — Ambulatory Visit (INDEPENDENT_AMBULATORY_CARE_PROVIDER_SITE_OTHER): Payer: Medicare Other | Admitting: *Deleted

## 2020-01-23 ENCOUNTER — Other Ambulatory Visit: Payer: Self-pay

## 2020-01-23 ENCOUNTER — Ambulatory Visit (INDEPENDENT_AMBULATORY_CARE_PROVIDER_SITE_OTHER): Payer: Medicare Other | Admitting: Cardiovascular Disease

## 2020-01-23 VITALS — BP 120/66 | HR 67 | Resp 95 | Ht 75.0 in | Wt 205.2 lb

## 2020-01-23 DIAGNOSIS — I491 Atrial premature depolarization: Secondary | ICD-10-CM

## 2020-01-23 DIAGNOSIS — R079 Chest pain, unspecified: Secondary | ICD-10-CM

## 2020-01-23 DIAGNOSIS — J309 Allergic rhinitis, unspecified: Secondary | ICD-10-CM | POA: Diagnosis not present

## 2020-01-23 DIAGNOSIS — R931 Abnormal findings on diagnostic imaging of heart and coronary circulation: Secondary | ICD-10-CM

## 2020-01-23 DIAGNOSIS — R002 Palpitations: Secondary | ICD-10-CM

## 2020-01-23 DIAGNOSIS — R0602 Shortness of breath: Secondary | ICD-10-CM

## 2020-01-23 DIAGNOSIS — R9431 Abnormal electrocardiogram [ECG] [EKG]: Secondary | ICD-10-CM

## 2020-01-23 MED ORDER — METOPROLOL TARTRATE 50 MG PO TABS: ORAL_TABLET | ORAL | 0 refills | Status: DC

## 2020-01-23 NOTE — Patient Instructions (Signed)
Medication Instructions:  Take Metoprolol 50 mg two hours before CT when scheduled.   *If you need a refill on your cardiac medications before your next appointment, please call your pharmacy*   Lab Work: BMET, BNP today   If you have labs (blood work) drawn today and your tests are completely normal, you will receive your results only by: Marland Kitchen MyChart Message (if you have MyChart) OR . A paper copy in the mail If you have any lab test that is abnormal or we need to change your treatment, we will call you to review the results.   Testing/Procedures:  Your physician has requested that you have cardiac CT. Cardiac computed tomography (CT) is a painless test that uses an x-ray machine to take clear, detailed pictures of your heart. For further information please visit HugeFiesta.tn. Please follow instruction sheet as given.  Follow-Up: At [facility], you and your health needs are our priority.  As part of our continuing mission to provide you with exceptional heart care, we have created designated Provider Care Teams.  These Care Teams include your primary Cardiologist (physician) and Advanced Practice Providers (APPs -  Physician Assistants and Nurse Practitioners) who all work together to provide you with the care you need, when you need it.  We recommend signing up for the patient portal called "MyChart".  Sign up information is provided on this After Visit Summary.  MyChart is used to connect with patients for Virtual Visits (Telemedicine).  Patients are able to view lab/test results, encounter notes, upcoming appointments, etc.  Non-urgent messages can be sent to your provider as well.   To learn more about what you can do with MyChart, go to [email or web address].    Your next appointment:   3 month(s)  The format for your next appointment:   In Person  Provider:   Eleonore Chiquito, MD   Other Instructions Your cardiac CT will be scheduled at one of the below locations:    [facility] [address] [place], [place] [address] [phone]  scheduled at [facility], please arrive at the [facility] main entrance of [facility] 30 minutes prior to test start time. Proceed to the [facility] Radiology Department (first floor) to check-in and test prep.   Please follow these instructions carefully (unless otherwise directed):  Hold all erectile dysfunction medications at least 3 days (72 hrs) prior to test.  On the Night Before the Test: . Be sure to Drink plenty of water. . Do not consume any caffeinated/decaffeinated beverages or chocolate 12 hours prior to your test. . Do not take any antihistamines 12 hours prior to your test.  On the Day of the Test: . Drink plenty of water. Do not drink any water within one hour of the test. . Do not eat any food 4 hours prior to the test. . You may take your regular medications prior to the test.  . Take metoprolol (Lopressor) two hours prior to test. . HOLD Furosemide/Hydrochlorothiazide morning of the test. . FEMALES- please wear underwire-free bra if available      After the Test: . Drink plenty of water. . After receiving IV contrast, you may experience a mild flushed feeling. This is normal. . On occasion, you may experience a mild rash up to 24 hours after the test. This is not dangerous. If this occurs, you can take Benadryl 25 mg and increase your fluid intake. . If you experience trouble breathing, this can be serious. If  it is severe call 911 IMMEDIATELY. If it is mild, please call our office. . If you take any of these medications: Glipizide/Metformin, Avandament, Glucavance, please do not take 48 hours after completing test unless otherwise instructed.   Once we have confirmed authorization from your insurance company, we will call you to set up a date and time for your test. Based on how quickly your insurance processes prior authorizations requests, please allow up to  4 weeks to be contacted for scheduling your Cardiac CT appointment. Be advised that routine Cardiac CT appointments could be scheduled as many as 8 weeks after your provider has ordered it.  For non-scheduling related questions, please contact the cardiac imaging nurse navigator should you have any questions/concerns: Marchia Bond, Cardiac Imaging Nurse Navigator Burley Saver, Interim Cardiac Imaging Nurse [place] and Vascular Services Direct Office Dial: [phone]   For scheduling needs, including cancellations and rescheduling, please call Vivien Rota at [phone], option 3.

## 2020-01-24 DIAGNOSIS — J301 Allergic rhinitis due to pollen: Secondary | ICD-10-CM

## 2020-01-24 LAB — BASIC METABOLIC PANEL
BUN/Creatinine Ratio: 32 — ABNORMAL HIGH (ref 10–24)
BUN: 26 mg/dL (ref 8–27)
CO2: 22 mmol/L (ref 20–29)
Calcium: 9 mg/dL (ref 8.6–10.2)
Chloride: 104 mmol/L (ref 96–106)
Creatinine, Ser: 0.81 mg/dL (ref 0.76–1.27)
GFR calc Af Amer: 98 mL/min/{1.73_m2} (ref >59)
GFR calc non Af Amer: 85 mL/min/{1.73_m2} (ref >59)
Glucose: 84 mg/dL (ref 65–99)
Potassium: 4.5 mmol/L (ref 3.5–5.2)
Sodium: 139 mmol/L (ref 134–144)

## 2020-01-24 LAB — BRAIN NATRIURETIC PEPTIDE: BNP: 48.2 pg/mL (ref 0.0–100.0)

## 2020-01-24 NOTE — Progress Notes (Signed)
VIALS EXP 01-23-21 

## 2020-01-25 ENCOUNTER — Telehealth (HOSPITAL_COMMUNITY): Payer: Self-pay | Admitting: Emergency Medicine

## 2020-01-25 ENCOUNTER — Telehealth: Payer: Self-pay | Admitting: Cardiovascular Disease

## 2020-01-25 DIAGNOSIS — J3089 Other allergic rhinitis: Secondary | ICD-10-CM

## 2020-01-25 NOTE — Telephone Encounter (Signed)
Patient would like to know how long he is to keep the heart monitor on.

## 2020-01-25 NOTE — Telephone Encounter (Signed)
Attempted to call patient regarding upcoming cardiac CT appointment. °Left message on voicemail with name and callback number °Shadia Larose RN Navigator Cardiac Imaging °[facility] °[phone] Office °[phone] Cell ° °

## 2020-01-25 NOTE — Telephone Encounter (Signed)
Dr. Audie Box asked that you wear the ZIO XT patch monitor for 7 days.

## 2020-01-26 ENCOUNTER — Ambulatory Visit (HOSPITAL_COMMUNITY)
Admission: RE | Admit: 2020-01-26 | Discharge: 2020-01-26 | Disposition: A | Discharge status: Home / Self Care | Payer: Medicare Other | Source: Ambulatory Visit | Attending: Cardiovascular Disease | Admitting: Cardiovascular Disease

## 2020-01-26 ENCOUNTER — Telehealth: Payer: Self-pay | Admitting: Cardiovascular Disease

## 2020-01-26 ENCOUNTER — Ambulatory Visit (INDEPENDENT_AMBULATORY_CARE_PROVIDER_SITE_OTHER): Payer: Medicare Other | Admitting: Otolaryngology

## 2020-01-26 ENCOUNTER — Other Ambulatory Visit: Payer: Self-pay

## 2020-01-26 VITALS — Temp 97.7°F

## 2020-01-26 DIAGNOSIS — H61813 Exostosis of external canal, bilateral: Secondary | ICD-10-CM

## 2020-01-26 DIAGNOSIS — R079 Chest pain, unspecified: Secondary | ICD-10-CM

## 2020-01-26 DIAGNOSIS — R9431 Abnormal electrocardiogram [ECG] [EKG]: Secondary | ICD-10-CM | POA: Diagnosis present

## 2020-01-26 MED ORDER — IOHEXOL 350 MG/ML SOLN
80.0000 mL | Freq: Once | INTRAVENOUS | Status: AC | PRN
  Administered 2020-01-26: 80 mL via INTRAVENOUS

## 2020-01-26 MED ORDER — NITROGLYCERIN 0.4 MG SL SUBL
SUBLINGUAL_TABLET | SUBLINGUAL | Status: AC
  Administered 2020-01-26: 0.8 mg via SUBLINGUAL
  Filled 2020-01-26: qty 2

## 2020-01-26 MED ORDER — NITROGLYCERIN 0.4 MG SL SUBL: 0.8000 mg | SUBLINGUAL_TABLET | Freq: Once | SUBLINGUAL | Status: AC

## 2020-01-26 NOTE — Progress Notes (Signed)
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  Apparently got some water stuck in the right ear canal and had difficulty clearing this.  He wanted to be checked to see if he had any wax buildup.  Past Medical History:  Diagnosis Date  . Allergic rhinitis   . Allergy   . Asthma    50 years ago  . Giardia    Past Surgical History:  Procedure Laterality Date  . abdominal aorta ultrasound  07/22/2009   mild atherosclerotic changes without aneurysm  . COLONOSCOPY    . HAND SURGERY  age 33   for infection  . NASAL HEMORRHAGE CONTROL N/A 01/08/2017   Procedure: MINOR EPISTAXIS CONTROL;  Surgeon: Rozetta Nunnery, MD;  Location: [facility];  Service: ENT;  Laterality: N/A;  . NASAL SINUS SURGERY    . NM MYOVIEW LTD  03/19/2009   normal/ EF- 68%   Social History   Socioeconomic History  . Marital status: Married    Spouse name: Not on file  . Number of children: 3  . Years of education: Not on file  . Highest education level: Not on file  Occupational History  . Occupation: ENT surgeon  Tobacco Use  . Smoking status: Former Smoker    Types: Pipe  . Smokeless tobacco: Never Used  . Tobacco comment: 40 years ago  Substance and Sexual Activity  . Alcohol use: Yes    Comment: occasionally  . Drug use: No  . Sexual activity: Not on file  Other Topics Concern  . Not on file  Social History Narrative  . Not on file   Social Determinants of Health   Financial Resource Strain:   . Difficulty of Paying Living Expenses: Not on file  Food Insecurity:   . Worried About Charity fundraiser in the Last Year: Not on file  . Ran Out of Food in the Last Year: Not on file  Transportation Needs:   . Lack of Transportation (Medical): Not on file  . Lack of Transportation (Non-Medical): Not on file  Physical Activity:   . Days of Exercise per Week: Not on file  . Minutes of Exercise per Session: Not on  file  Stress:   . Feeling of Stress : Not on file  Social Connections:   . Frequency of Communication with Friends and Family: Not on file  . Frequency of Social Gatherings with Friends and Family: Not on file  . Attends Religious Services: Not on file  . Active Member of Clubs or Organizations: Not on file  . Attends Archivist Meetings: Not on file  . Marital Status: Not on file   Family History  Problem Relation Age of Onset  . Aortic aneurysm Father 61  . Lung cancer Mother 32  . Allergic rhinitis Other        GM  . Hypertension Brother   . Pulmonary embolism Maternal Grandfather   . Heart attack Neg Hx   . Stroke Neg Hx   . Colon cancer Neg Hx   . Colon polyps Neg Hx   . Esophageal cancer Neg Hx   . Rectal cancer Neg Hx   . Stomach cancer Neg Hx    Allergies  Allergen Reactions  . Apple   . Bee Venom Other (See Comments)    unknown  . Blackberry [Rubus Fruticosus]   . Onion   . Peach Flavor   .  Pear   . Plum Pulp    Prior to Admission medications   Medication Sig Start Date End Date Taking? Authorizing Provider  EPINEPHrine (EPIPEN 2-PAK) 0.3 mg/0.3 mL IJ SOAJ injection Use as directed for life-threatening allergic reaction.    [provider]  fexofenadine (ALLEGRA) 180 MG tablet Take 180 mg by mouth.    [provider]  metoprolol tartrate (LOPRESSOR) 50 MG tablet Take 1 tablet by mouth once for procedure. 01/23/20   O'NealCassie Freer, MD  Multiple Vitamins-Minerals (MULTIVITAMIN ADULTS PO) Take by mouth.    [provider]  NONFORMULARY OR COMPOUNDED ITEM Allergy Vaccine 1:10 Given at [facility] Pulmonary    [provider]     Positive ROS: Otherwise negative  All other systems have been reviewed and were otherwise negative with the exception of those mentioned in the HPI and as above.  Physical Exam: Constitutional: Alert, well-appearing, no acute distress Ears: External ears without lesions or tenderness.   Left ear canal and TM are clear.  Right ear canal reveals severe bony exostosis with only a 1 to 2 mm gap deep within the ear canal.  There is no significant wax obstructing his ear canal and the obstruction is from the bony exostosis which he has had for years. Nasal: External nose without lesion. Clear nasal passages Oral: Lips and gums without lesions. Tongue and palate mucosa without lesions. Posterior oropharynx clear. Neck: No palpable adenopathy or masses Respiratory: Breathing comfortably  Skin: No facial/neck lesions or rash noted.  Procedures  Assessment: Severe right ear canal bony exostosis  Plan: Discussed with him that there is no significant wax obstructing his ear canal and that the ear canal obstruction is secondary to bony exostosis. He will follow-up as needed  [name], MD

## 2020-01-26 NOTE — Telephone Encounter (Signed)
Called Dr. Ernesto Rutherford. Minimal CAD. We will discuss statin and aspirin at next visit. Overall, reassuring.   [place] T. Audie Box, [place]  [address], [place], [place] [address] [phone]  5:32 PM

## 2020-01-30 ENCOUNTER — Other Ambulatory Visit (HOSPITAL_COMMUNITY): Payer: Self-pay | Admitting: Orthopedic Surgery

## 2020-02-01 ENCOUNTER — Ambulatory Visit (INDEPENDENT_AMBULATORY_CARE_PROVIDER_SITE_OTHER): Payer: Medicare Other

## 2020-02-01 DIAGNOSIS — R002 Palpitations: Secondary | ICD-10-CM | POA: Diagnosis not present

## 2020-02-05 ENCOUNTER — Ambulatory Visit (INDEPENDENT_AMBULATORY_CARE_PROVIDER_SITE_OTHER): Payer: Medicare Other

## 2020-02-05 DIAGNOSIS — J309 Allergic rhinitis, unspecified: Secondary | ICD-10-CM

## 2020-02-09 ENCOUNTER — Other Ambulatory Visit (HOSPITAL_COMMUNITY): Payer: Self-pay | Admitting: Otolaryngology

## 2020-02-09 ENCOUNTER — Other Ambulatory Visit: Payer: Self-pay

## 2020-02-09 ENCOUNTER — Ambulatory Visit (INDEPENDENT_AMBULATORY_CARE_PROVIDER_SITE_OTHER): Payer: Medicare Other | Admitting: Otolaryngology

## 2020-02-09 DIAGNOSIS — J329 Chronic sinusitis, unspecified: Secondary | ICD-10-CM

## 2020-02-09 NOTE — Progress Notes (Signed)
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  He has had previous sinus surgery over 10 years ago.  He has been having some congestion as well as some thick bloody discharge from his nose.  He has symptoms typical for him of a sinus infection.  He has previously used his Zithromax with good success and requested a refill of Zithromax.  He has recently taken Ceftin for another type infection. He is probably not running a fever.  He has a significant history of allergies..  Past Medical History:  Diagnosis Date  . Allergic rhinitis   . Allergy   . Asthma    50 years ago  . Giardia    Past Surgical History:  Procedure Laterality Date  . abdominal aorta ultrasound  07/22/2009   mild atherosclerotic changes without aneurysm  . COLONOSCOPY    . HAND SURGERY  age 56   for infection  . NASAL HEMORRHAGE CONTROL N/A 01/08/2017   Procedure: MINOR EPISTAXIS CONTROL;  Surgeon: Rozetta Nunnery, MD;  Location: [facility];  Service: ENT;  Laterality: N/A;  . NASAL SINUS SURGERY    . NM MYOVIEW LTD  03/19/2009   normal/ EF- 68%   Social History   Socioeconomic History  . Marital status: Married    Spouse name: Not on file  . Number of children: 3  . Years of education: Not on file  . Highest education level: Not on file  Occupational History  . Occupation: ENT surgeon  Tobacco Use  . Smoking status: Former Smoker    Types: Pipe  . Smokeless tobacco: Never Used  . Tobacco comment: 40 years ago  Substance and Sexual Activity  . Alcohol use: Yes    Comment: occasionally  . Drug use: No  . Sexual activity: Not on file  Other Topics Concern  . Not on file  Social History Narrative  . Not on file   Social Determinants of Health   Financial Resource Strain:   . Difficulty of Paying Living Expenses: Not on file  Food Insecurity:   . Worried About Charity fundraiser in the Last Year: Not on file  .  Ran Out of Food in the Last Year: Not on file  Transportation Needs:   . Lack of Transportation (Medical): Not on file  . Lack of Transportation (Non-Medical): Not on file  Physical Activity:   . Days of Exercise per Week: Not on file  . Minutes of Exercise per Session: Not on file  Stress:   . Feeling of Stress : Not on file  Social Connections:   . Frequency of Communication with Friends and Family: Not on file  . Frequency of Social Gatherings with Friends and Family: Not on file  . Attends Religious Services: Not on file  . Active Member of Clubs or Organizations: Not on file  . Attends Archivist Meetings: Not on file  . Marital Status: Not on file   Family History  Problem Relation Age of Onset  . Aortic aneurysm Father 49  . Lung cancer Mother 64  . Allergic rhinitis Other        GM  . Hypertension Brother   . Pulmonary embolism Maternal Grandfather   . Heart attack Neg Hx   . Stroke Neg Hx   . Colon cancer Neg Hx   . Colon polyps Neg Hx   . Esophageal cancer Neg Hx   .  Rectal cancer Neg Hx   . Stomach cancer Neg Hx    Allergies  Allergen Reactions  . Apple   . Bee Venom Other (See Comments)    unknown  . Blackberry [Rubus Fruticosus]   . Onion   . Peach Flavor   . Pear   . Plum Pulp    Prior to Admission medications   Medication Sig Start Date End Date Taking? Authorizing Provider  EPINEPHrine (EPIPEN 2-PAK) 0.3 mg/0.3 mL IJ SOAJ injection Use as directed for life-threatening allergic reaction.    [provider]  fexofenadine (ALLEGRA) 180 MG tablet Take 180 mg by mouth.    [provider]  metoprolol tartrate (LOPRESSOR) 50 MG tablet Take 1 tablet by mouth once for procedure. 01/23/20   O'NealCassie Freer, MD  Multiple Vitamins-Minerals (MULTIVITAMIN ADULTS PO) Take by mouth.    [provider]  NONFORMULARY OR COMPOUNDED ITEM Allergy Vaccine 1:10 Given at [facility]    [provider]      Positive ROS: Otherwise negative  All other systems have been reviewed and were otherwise negative with the exception of those mentioned in the HPI and as above.  Physical Exam: Constitutional: Alert, well-appearing, no acute distress On discussion on the phone he has nasal congestion and some bloody discharge from his nose.  He is having no airway problems.  Procedures  Assessment: Recurrent sinus infection  Plan: Called in Zithromax 500 mg daily for 3 days to the [facility] outpatient pharmacy with 1 refill.   [name], MD

## 2020-02-12 ENCOUNTER — Ambulatory Visit (INDEPENDENT_AMBULATORY_CARE_PROVIDER_SITE_OTHER): Payer: Medicare Other

## 2020-02-12 DIAGNOSIS — J309 Allergic rhinitis, unspecified: Secondary | ICD-10-CM | POA: Diagnosis not present

## 2020-02-19 ENCOUNTER — Ambulatory Visit (INDEPENDENT_AMBULATORY_CARE_PROVIDER_SITE_OTHER): Payer: Medicare Other

## 2020-02-19 DIAGNOSIS — J309 Allergic rhinitis, unspecified: Secondary | ICD-10-CM

## 2020-02-20 ENCOUNTER — Telehealth: Payer: Self-pay | Admitting: Cardiovascular Disease

## 2020-02-20 NOTE — Telephone Encounter (Signed)
    Pt would like to check if his heart monitor result is available

## 2020-02-20 NOTE — Telephone Encounter (Signed)
Patient returned ZIO monitor 2 weeks ago. Result has not been loaded in system to be read. I have updated the patient and told that will reach out to him once we have result.

## 2020-02-26 ENCOUNTER — Ambulatory Visit (INDEPENDENT_AMBULATORY_CARE_PROVIDER_SITE_OTHER): Payer: Medicare Other

## 2020-02-26 DIAGNOSIS — J309 Allergic rhinitis, unspecified: Secondary | ICD-10-CM

## 2020-03-04 ENCOUNTER — Ambulatory Visit (INDEPENDENT_AMBULATORY_CARE_PROVIDER_SITE_OTHER): Payer: Medicare Other

## 2020-03-04 DIAGNOSIS — J309 Allergic rhinitis, unspecified: Secondary | ICD-10-CM | POA: Diagnosis not present

## 2020-03-25 ENCOUNTER — Ambulatory Visit (INDEPENDENT_AMBULATORY_CARE_PROVIDER_SITE_OTHER): Payer: Medicare Other | Admitting: *Deleted

## 2020-03-25 DIAGNOSIS — J309 Allergic rhinitis, unspecified: Secondary | ICD-10-CM | POA: Diagnosis not present

## 2020-04-02 ENCOUNTER — Ambulatory Visit (INDEPENDENT_AMBULATORY_CARE_PROVIDER_SITE_OTHER): Payer: Medicare Other | Admitting: *Deleted

## 2020-04-02 DIAGNOSIS — J309 Allergic rhinitis, unspecified: Secondary | ICD-10-CM | POA: Diagnosis not present

## 2020-04-08 ENCOUNTER — Ambulatory Visit (INDEPENDENT_AMBULATORY_CARE_PROVIDER_SITE_OTHER): Payer: Medicare Other

## 2020-04-08 DIAGNOSIS — J309 Allergic rhinitis, unspecified: Secondary | ICD-10-CM | POA: Diagnosis not present

## 2020-04-16 ENCOUNTER — Ambulatory Visit (INDEPENDENT_AMBULATORY_CARE_PROVIDER_SITE_OTHER): Payer: Medicare Other | Admitting: *Deleted

## 2020-04-16 DIAGNOSIS — J309 Allergic rhinitis, unspecified: Secondary | ICD-10-CM | POA: Diagnosis not present

## 2020-04-21 NOTE — Progress Notes (Deleted)
Cardiology Office Note:   Date:  04/21/2020  NAME:  Harry [name]    MRN: [id number] DOB:  02-Apr-1941   PCP:  Crist Infante, MD  Cardiologist:  No primary care provider on file.  Electrophysiologist:  None   Referring MD: Crist Infante, MD   No chief complaint on file. ***  History of Present Illness:   BARKON CLINK is a 80 y.o. male with a hx of asthma who presents for follow-up. Seen for CP. Found to have minimal CAD. Monitor with PACs and no Afib.   Problem List 1. CAD -Minimal CAD (<25%) -CAC score 72 (21st percentile) 2. PACs -5.5% burden 3. EAT -brief atrial tachycardia (longest 4.3 seconds)  Past Medical History: Past Medical History:  Diagnosis Date  . Allergic rhinitis   . Allergy   . Asthma    50 years ago  . Giardia     Past Surgical History: Past Surgical History:  Procedure Laterality Date  . abdominal aorta ultrasound  07/22/2009   mild atherosclerotic changes without aneurysm  . COLONOSCOPY    . HAND SURGERY  age 4   for infection  . NASAL HEMORRHAGE CONTROL N/A 01/08/2017   Procedure: MINOR EPISTAXIS CONTROL;  Surgeon: Rozetta Nunnery, MD;  Location: [facility];  Service: ENT;  Laterality: N/A;  . NASAL SINUS SURGERY    . NM MYOVIEW LTD  03/19/2009   normal/ EF- 68%    Current Medications: No outpatient medications have been marked as taking for the 04/24/20 encounter (Appointment) with Geralynn Rile, MD.   Current Facility-Administered Medications for the 04/24/20 encounter (Appointment) with O'Neal, Cassie Freer, MD  Medication  . 0.9 %  sodium chloride infusion     Allergies:    Apple, Bee venom, Blackberry [rubus fruticosus], Onion, Peach flavor, Pear, and Plum pulp   Social History: Social History   Socioeconomic History  . Marital status: Married    Spouse name: Not on file  . Number of children: 3  . Years of education: Not on file  . Highest education level: Not on file  Occupational History   . Occupation: ENT surgeon  Tobacco Use  . Smoking status: Former Smoker    Types: Pipe  . Smokeless tobacco: Never Used  . Tobacco comment: 40 years ago  Substance and Sexual Activity  . Alcohol use: Yes    Comment: occasionally  . Drug use: No  . Sexual activity: Not on file  Other Topics Concern  . Not on file  Social History Narrative  . Not on file   Social Determinants of Health   Financial Resource Strain: Not on file  Food Insecurity: Not on file  Transportation Needs: Not on file  Physical Activity: Not on file  Stress: Not on file  Social Connections: Not on file     Family History: The patient's ***family history includes Allergic rhinitis in an other family member; Aortic aneurysm (age of onset: 22) in his father; Hypertension in his brother; Lung cancer (age of onset: 22) in his mother; Pulmonary embolism in his maternal grandfather. There is no history of Heart attack, Stroke, Colon cancer, Colon polyps, Esophageal cancer, Rectal cancer, or Stomach cancer.  ROS:   All other ROS reviewed and negative. Pertinent positives noted in the HPI.     EKGs/Labs/Other Studies Reviewed:   The following studies were personally reviewed by me today:  EKG:  EKG is *** ordered today.  The ekg ordered today demonstrates ***, and was  personally reviewed by me.   CCTA 01/27/2020  IMPRESSION: 1. Coronary calcium score of 72. This was 21st percentile for age and sex matched controls.  2. Normal coronary origin with right dominance.  3. Minimal, non-obstructive CAD in the LAD and RCA (<25%).  RECOMMENDATIONS: 1. Minimal non-obstructive CAD (0-24%). Consider non-atherosclerotic causes of chest pain/shortness of breath. Consider preventive therapy and risk factor modification.  Recent Labs: 01/23/2020: BNP 48.2; BUN 26; Creatinine, Ser 0.81; Potassium 4.5; Sodium 139   Recent Lipid Panel    Component Value Date/Time   CHOL 206 (H) 05/17/2012 0817   TRIG 68.0  05/17/2012 0817   HDL 46.80 05/17/2012 0817   CHOLHDL 4 05/17/2012 0817   VLDL 13.6 05/17/2012 0817   LDLDIRECT 145.5 05/17/2012 0817    Physical Exam:   VS:  There were no vitals taken for this visit.   Wt Readings from Last 3 Encounters:  01/23/20 205 lb 3.2 oz (93.1 kg)  04/21/18 211 lb 9.6 oz (96 kg)  07/12/17 211 lb (95.7 kg)    General: Well nourished, well developed, in no acute distress Head: Atraumatic, normal size  Eyes: PEERLA, EOMI  Neck: Supple, no JVD Endocrine: No thryomegaly Cardiac: Normal S1, S2; RRR; no murmurs, rubs, or gallops Lungs: Clear to auscultation bilaterally, no wheezing, rhonchi or rales  Abd: Soft, nontender, no hepatomegaly  Ext: No edema, pulses 2+ Musculoskeletal: No deformities, BUE and BLE strength normal and equal Skin: Warm and dry, no rashes   Neuro: Alert and oriented to person, place, time, and situation, CNII-XII grossly intact, no focal deficits  Psych: Normal mood and affect   ASSESSMENT:   Harry [name] is a 80 y.o. male who presents for the following: No diagnosis found.  PLAN:   There are no diagnoses linked to this encounter.  Disposition: No follow-ups on file.  Medication Adjustments/Labs and Tests Ordered: Current medicines are reviewed at length with the patient today.  Concerns regarding medicines are outlined above.  No orders of the defined types were placed in this encounter.  No orders of the defined types were placed in this encounter.   There are no Patient Instructions on file for this visit.   Time Spent with Patient: I have spent a total of *** minutes with patient reviewing hospital notes, telemetry, EKGs, labs and examining the patient as well as establishing an assessment and plan that was discussed with the patient.  > 50% of time was spent in direct patient care.  Signed, Lenna Gilford. Flora Lipps, MD [facility]  [address] [place], [place] [address] [phone]   04/21/2020 7:15 PM

## 2020-04-22 ENCOUNTER — Ambulatory Visit (INDEPENDENT_AMBULATORY_CARE_PROVIDER_SITE_OTHER): Payer: Medicare Other | Admitting: *Deleted

## 2020-04-22 DIAGNOSIS — J309 Allergic rhinitis, unspecified: Secondary | ICD-10-CM | POA: Diagnosis not present

## 2020-04-24 ENCOUNTER — Ambulatory Visit: Payer: Medicare Other | Admitting: Cardiovascular Disease

## 2020-04-24 DIAGNOSIS — R931 Abnormal findings on diagnostic imaging of heart and coronary circulation: Secondary | ICD-10-CM

## 2020-04-24 DIAGNOSIS — I2510 Atherosclerotic heart disease of native coronary artery without angina pectoris: Secondary | ICD-10-CM

## 2020-04-24 DIAGNOSIS — I491 Atrial premature depolarization: Secondary | ICD-10-CM

## 2020-04-26 ENCOUNTER — Other Ambulatory Visit (INDEPENDENT_AMBULATORY_CARE_PROVIDER_SITE_OTHER): Payer: Self-pay

## 2020-04-26 MED ORDER — CEFUROXIME AXETIL 500 MG PO TABS: 500.0000 mg | ORAL_TABLET | Freq: Two times a day (BID) | ORAL | 0 refills | Status: DC

## 2020-04-26 MED ORDER — CEFUROXIME AXETIL 250 MG PO TABS: 250.0000 mg | ORAL_TABLET | Freq: Two times a day (BID) | ORAL | 0 refills | Status: DC

## 2020-05-07 NOTE — Progress Notes (Signed)
Patient home and I was in the office. 5-10 minutes

## 2020-05-15 ENCOUNTER — Ambulatory Visit (INDEPENDENT_AMBULATORY_CARE_PROVIDER_SITE_OTHER): Payer: Medicare Other

## 2020-05-15 DIAGNOSIS — J309 Allergic rhinitis, unspecified: Secondary | ICD-10-CM

## 2020-06-18 ENCOUNTER — Ambulatory Visit (INDEPENDENT_AMBULATORY_CARE_PROVIDER_SITE_OTHER): Payer: Medicare Other

## 2020-06-18 DIAGNOSIS — J309 Allergic rhinitis, unspecified: Secondary | ICD-10-CM

## 2020-06-25 ENCOUNTER — Ambulatory Visit (INDEPENDENT_AMBULATORY_CARE_PROVIDER_SITE_OTHER): Payer: Medicare Other | Admitting: Allergy and Immunology

## 2020-06-25 ENCOUNTER — Other Ambulatory Visit: Payer: Self-pay

## 2020-06-25 ENCOUNTER — Encounter: Payer: Self-pay | Admitting: Allergy and Immunology

## 2020-06-25 ENCOUNTER — Other Ambulatory Visit: Payer: Self-pay | Admitting: Allergy and Immunology

## 2020-06-25 VITALS — BP 122/76 | HR 66 | Temp 97.4°F | Resp 16 | Ht 75.0 in | Wt 202.0 lb

## 2020-06-25 DIAGNOSIS — J301 Allergic rhinitis due to pollen: Secondary | ICD-10-CM | POA: Diagnosis not present

## 2020-06-25 DIAGNOSIS — H1013 Acute atopic conjunctivitis, bilateral: Secondary | ICD-10-CM | POA: Diagnosis not present

## 2020-06-25 DIAGNOSIS — H1010 Acute atopic conjunctivitis, unspecified eye: Secondary | ICD-10-CM

## 2020-06-25 MED ORDER — EPINEPHRINE 0.3 MG/0.3ML IJ SOAJ: 0.3000 mg | INTRAMUSCULAR | 1 refills | Status: DC | PRN

## 2020-06-25 NOTE — Patient Instructions (Addendum)
  1.  Continue immunotherapy; return to clinic for skin testing  2.  Continue injectable epinephrine device if required  3.  Continue OTC antihistamine and Pataday if needed

## 2020-06-25 NOTE — Progress Notes (Signed)
[facility] [place] [facility] [place]   Follow-up Note  Referring Provider: Crist Infante, MD Primary Provider: Crist Infante, MD Date of Office Visit: 06/25/2020  Subjective:   Harry [name] (DOB: 03-05-1941) is a 80 y.o. male who returns to the [facility] on 06/25/2020 in re-evaluation of the following:  HPI: Harry [name] returns to this clinic in evaluation of allergic rhinoconjunctivitis treated with immunotherapy.  His last visit to this clinic was 24 January 2019.  Last spring he noticed a little bit more activity of both his nasal and eye symptoms tied up with atopic disease.  That appeared to abate in the summer and he did very well until the past 2 weeks.  Since the pollen has arrived he has had a little bit more nasal congestion and runny nose and has had itchy eyes.  He takes an Human resources officer every day which does help.  He has used a old prescription of a steroid eyedrop given to him by an ophthalmologist.  He is intolerant of using nasal steroids because of epistaxis.  He also has a history of oral allergy syndrome with problems when eating pears and peaches and apples and berries.  He remains away from consumption of these foods.  Allergies as of 06/25/2020      Reactions   Apple    Bee Venom Other (See Comments)   unknown   Blackberry [rubus Fruticosus]    Onion    Peach Flavor    Pear    Plum Pulp       Medication List    EPINEPHrine 0.3 mg/0.3 mL Soaj injection Commonly known as: EPI-PEN Inject 0.3 mg into the muscle as needed for anaphylaxis. Use as directed for life-threatening allergic reaction.   fexofenadine 180 MG tablet Commonly known as: ALLEGRA Take 180 mg by mouth.   MULTIVITAMIN ADULTS PO Take by mouth.   NONFORMULARY OR COMPOUNDED ITEM Allergy Vaccine 1:10 Given at [facility]       Past Medical History:  Diagnosis Date  . Allergic rhinitis   . Allergy   . Asthma    50 years ago  . Giardia     Past  Surgical History:  Procedure Laterality Date  . abdominal aorta ultrasound  07/22/2009   mild atherosclerotic changes without aneurysm  . COLONOSCOPY    . HAND SURGERY  age 20   for infection  . NASAL HEMORRHAGE CONTROL N/A 01/08/2017   Procedure: MINOR EPISTAXIS CONTROL;  Surgeon: Rozetta Nunnery, MD;  Location: [facility];  Service: ENT;  Laterality: N/A;  . NASAL SINUS SURGERY    . NM MYOVIEW LTD  03/19/2009   normal/ EF- 68%    Review of systems negative except as noted in HPI / PMHx or noted below:  Review of Systems  Constitutional: Negative.   HENT: Negative.   Eyes: Negative.   Respiratory: Negative.   Cardiovascular: Negative.   Gastrointestinal: Negative.   Genitourinary: Negative.   Musculoskeletal: Negative.   Skin: Negative.   Neurological: Negative.   Endo/Heme/Allergies: Negative.   Psychiatric/Behavioral: Negative.      Objective:   Vitals:   06/25/20 1633  BP: 122/76  Pulse: 66  Resp: 16  Temp: (!) 97.4 F (36.3 C)  SpO2: 96%   Height: 6\' 3"  (190.5 cm)  Weight: 202 lb (91.6 kg)   Physical Exam Constitutional:      Appearance: He is not diaphoretic.  HENT:     Head: Normocephalic.  Right Ear: Tympanic membrane, ear canal and external ear normal.     Left Ear: Tympanic membrane, ear canal and external ear normal.     Nose: Nose normal. No mucosal edema or rhinorrhea.     Mouth/Throat:     Mouth: Oropharynx is clear and moist and mucous membranes are normal.     Pharynx: Uvula midline. No oropharyngeal exudate.  Eyes:     Conjunctiva/sclera: Conjunctivae normal.  Neck:     Thyroid: No thyromegaly.     Trachea: Trachea normal. No tracheal tenderness or tracheal deviation.  Cardiovascular:     Rate and Rhythm: Normal rate and regular rhythm.     Heart sounds: Normal heart sounds, S1 normal and S2 normal. No murmur heard.   Pulmonary:     Effort: No respiratory distress.     Breath sounds: Normal breath sounds. No  stridor. No wheezing or rales.  Musculoskeletal:        General: No edema.  Lymphadenopathy:     Head:     Right side of head: No tonsillar adenopathy.     Left side of head: No tonsillar adenopathy.     Cervical: No cervical adenopathy.  Skin:    Findings: No erythema or rash.     Nails: There is no clubbing.  Neurological:     Mental Status: He is alert.     Diagnostics: none  Assessment and Plan:   1. Seasonal allergic rhinitis due to pollen   2. Seasonal allergic conjunctivitis     1.  Continue immunotherapy; return to clinic for skin testing  2.  Continue injectable epinephrine device if required  3.  Continue OTC antihistamine and Pataday if needed  I think the best way to approach Jim's problem is to reskin testing to see if he has developed new sensitivities directed against various pollens and remix his immunotherapy based upon the results of that test.  I will see him back in this clinic in a week or 2 for skin testing.  Allena Katz, MD [facility]

## 2020-06-26 ENCOUNTER — Encounter: Payer: Self-pay | Admitting: Allergy and Immunology

## 2020-07-02 ENCOUNTER — Ambulatory Visit: Payer: Medicare Other | Admitting: Allergy and Immunology

## 2020-07-03 NOTE — Progress Notes (Addendum)
Harry [name] [id number] Dept: [phone]  FOLLOW UP NOTE  Patient ID: Harry [name], male    DOB: 1940/09/16  Age: 80 y.o. MRN: [id number] Date of Office Visit: 07/04/2020  Assessment  Chief Complaint: Allergy Testing (Says he needs to be tested for out door elements. Says he has food allergies but avoids them)  HPI Harry [name] is an 80 year old male who presents to the clinic for follow-up visit with allergy skin testing.  He was last seen in this clinic on 06/25/2020 by Dr. Neldon Mc for evaluation of allergic rhinitis, allergic conjunctivitis, atopic dermatitis, epistaxis, and oral allergy syndrome.  At today's visit, he reports that he has had increase in nasal congestion, clear rhinorrhea and postnasal drainage with frequent throat clearing that began recently.  He occasionally takes Allegra with moderate relief of symptoms.  He continues allergen immunotherapy once every 4 weeks from the 1-100 vial directed toward tree, cat, dust mite, and weed pollen.  With his last injection on 06/18/2020 allergic conjunctivitis is reported as poorly controlled with red and itchy eyes for which he used a steroid eyedrop 1 time and is currently using allergy eyedrops with moderate relief.  He continues to avoid foods that irritate his mouth including cherries, berries, apples, and peaches and has not needed to use his EpiPen since his last visit to this clinic.   Drug Allergies:  Allergies  Allergen Reactions   Apple    Bee Venom Other (See Comments)    unknown   Blackberry [Rubus Fruticosus]    Onion    Peach Flavor    Pear    Plum Pulp     Physical Exam: BP 122/76   Pulse (!) 59   Temp 98.5 F (36.9 C)   Resp 14   Ht 6\' 3"  (1.905 m)   Wt 202 lb (91.6 kg)   SpO2 96%   BMI 25.25 kg/m    Physical Exam Vitals reviewed.  Constitutional:      Appearance: Normal appearance.  HENT:     Head: Normocephalic and atraumatic.     Right Ear: Tympanic membrane  normal.     Left Ear: Tympanic membrane normal.     Nose:     Comments: Bilateral nares slightly erythematous with clear nasal drainage noted.  Pharynx slightly erythematous with no exudate noted.  Ears normal.  Eyes normal. Eyes:     Conjunctiva/sclera: Conjunctivae normal.  Cardiovascular:     Rate and Rhythm: Normal rate and regular rhythm.     Heart sounds: Normal heart sounds. No murmur heard.   Pulmonary:     Effort: Pulmonary effort is normal.     Breath sounds: Normal breath sounds.     Comments: Lungs clear to auscultation Musculoskeletal:        General: Normal range of motion.     Cervical back: Normal range of motion and neck supple.  Skin:    General: Skin is warm and dry.  Neurological:     Mental Status: He is alert and oriented to person, place, and time.  Psychiatric:        Mood and Affect: Mood normal.        Behavior: Behavior normal.        Thought Content: Thought content normal.        Judgment: Judgment normal.     Diagnostics: Percutaneous environmental skin testing was positive to weed pollen, ragweed pollen, tree pollen, and dust mites with adequate controls.  Intradermal testing was positive to weed mix with adequate control.   Assessment and Plan: 1. Seasonal and perennial allergic rhinitis   2. Seasonal allergic conjunctivitis   3. Pollen-food allergy, subsequent encounter     Patient Instructions  Allergic rhinitis Your skin testing was positive to weed pollen, ragweed pollen, tree pollen, and dust mites  Continue Allegra 180 mg once a day as needed for runny nose or itch Consider saline nasal rinses as needed for nasal symptoms. Use this before any medicated nasal sprays for best result  Allergic conjunctivitis Some over the counter eye drops include Pataday one drop in each eye once a day as needed for red, itchy eyes OR Zaditor one drop in each eye twice a day as needed for red itchy eyes.  Epistaxis  Pinch both nostrils while  leaning forward for at least 5 minutes before checking to see if the bleeding has stopped. If bleeding is not controlled within 5-10 minutes apply a cotton ball soaked with oxymetazoline (Afrin) to the bleeding nostril for a few seconds.  If the problem persists or worsens a referral to ENT for further evaluation may be necessary.  Oral allergy syndrome Continue to avoid fresh fruits that bother your mouth such as pear, berries, and peaches  Call the clinic if this treatment plan is not working well for you  Follow up in 6 months or sooner if needed.   Return in about 6 months (around 01/04/2021), or if symptoms worsen or fail to improve.    Thank you for the opportunity to care for this patient.  Please do not hesitate to contact me with questions.  Gareth Morgan, FNP [facility]   ------------------------------------- Attestation:  I reviewed the Nurse Practitioner's note and agree with the documented findings and plan of care. We discussed the patient and developed a plan concurrently.   Prudy Feeler, MD [facility]

## 2020-07-03 NOTE — Patient Instructions (Signed)
Allergic rhinitis Your skin testing was positive to weed pollen, ragweed pollen, tree pollen, and dust mites  Continue Allegra 180 mg once a day as needed for runny nose or itch Consider saline nasal rinses as needed for nasal symptoms. Use this before any medicated nasal sprays for best result  Allergic conjunctivitis Some over the counter eye drops include Pataday one drop in each eye once a day as needed for red, itchy eyes OR Zaditor one drop in each eye twice a day as needed for red itchy eyes.  Epistaxis  Pinch both nostrils while leaning forward for at least 5 minutes before checking to see if the bleeding has stopped. If bleeding is not controlled within 5-10 minutes apply a cotton ball soaked with oxymetazoline (Afrin) to the bleeding nostril for a few seconds.  If the problem persists or worsens a referral to ENT for further evaluation may be necessary.  Oral allergy syndrome Continue to avoid fresh fruits that bother your mouth such as pear, berries, and peaches  Call the clinic if this treatment plan is not working well for you  Follow up in 6 months or sooner if needed.

## 2020-07-04 ENCOUNTER — Ambulatory Visit (INDEPENDENT_AMBULATORY_CARE_PROVIDER_SITE_OTHER): Payer: Medicare Other | Admitting: Family Medicine

## 2020-07-04 ENCOUNTER — Other Ambulatory Visit: Payer: Self-pay

## 2020-07-04 ENCOUNTER — Encounter: Payer: Self-pay | Admitting: Family Medicine

## 2020-07-04 VITALS — BP 122/76 | HR 59 | Temp 98.5°F | Resp 14 | Ht 75.0 in | Wt 202.0 lb

## 2020-07-04 DIAGNOSIS — H1010 Acute atopic conjunctivitis, unspecified eye: Secondary | ICD-10-CM | POA: Insufficient documentation

## 2020-07-04 DIAGNOSIS — J301 Allergic rhinitis due to pollen: Secondary | ICD-10-CM | POA: Insufficient documentation

## 2020-07-04 DIAGNOSIS — J302 Other seasonal allergic rhinitis: Secondary | ICD-10-CM | POA: Diagnosis not present

## 2020-07-04 DIAGNOSIS — T781XXD Other adverse food reactions, not elsewhere classified, subsequent encounter: Secondary | ICD-10-CM | POA: Diagnosis not present

## 2020-07-04 DIAGNOSIS — J3089 Other allergic rhinitis: Secondary | ICD-10-CM | POA: Diagnosis not present

## 2020-07-12 ENCOUNTER — Telehealth: Payer: Self-pay | Admitting: Allergy and Immunology

## 2020-07-12 NOTE — Telephone Encounter (Signed)
Please advise 

## 2020-07-12 NOTE — Telephone Encounter (Signed)
Pt would like to know the results of his last allergy testing (compared to previous allergy testing), and if any change to his allergy injection serum needs to be made. Pt would like a call back as soon as possible.   Pt would like to be contacted directly, [phone].  Please advise.

## 2020-07-15 NOTE — Telephone Encounter (Signed)
Have written Dr. Ernesto Rutherford an email concerning this issue. Did he get the email?

## 2020-07-15 NOTE — Telephone Encounter (Signed)
Pt has received the email

## 2020-07-15 NOTE — Telephone Encounter (Signed)
Pt wants to know are you going to change his serum?

## 2020-07-23 NOTE — Telephone Encounter (Signed)
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  Currently he is using this therapy every 4 weeks and he can try every 2 weeks and he can actually go to every week during the spring.

## 2020-07-23 NOTE — Telephone Encounter (Signed)
Spoke with patient, informed him of Dr. Bruna Potter recommendation. Patient verbalized understanding and will be in office in the morning to get his injections. Flowsheet has been updated to reflect this change.

## 2020-07-23 NOTE — Telephone Encounter (Signed)
Patient called and would like a call back regarding increasing his allergy injection dosage as his allergies have been acting up recently. Patient is planning to come get an injection tomorrow and would like to know.

## 2020-07-24 ENCOUNTER — Ambulatory Visit (INDEPENDENT_AMBULATORY_CARE_PROVIDER_SITE_OTHER): Payer: Medicare Other

## 2020-07-24 DIAGNOSIS — J309 Allergic rhinitis, unspecified: Secondary | ICD-10-CM

## 2020-07-25 ENCOUNTER — Other Ambulatory Visit: Payer: Self-pay

## 2020-07-25 ENCOUNTER — Ambulatory Visit (INDEPENDENT_AMBULATORY_CARE_PROVIDER_SITE_OTHER): Payer: Medicare Other | Admitting: Otolaryngology

## 2020-07-25 VITALS — Temp 97.2°F

## 2020-07-25 DIAGNOSIS — H903 Sensorineural hearing loss, bilateral: Secondary | ICD-10-CM

## 2020-07-25 DIAGNOSIS — H61811 Exostosis of right external canal: Secondary | ICD-10-CM

## 2020-07-25 NOTE — Progress Notes (Signed)
HPI: Harry [name] is a 80 y.o. male who returns today for evaluation of ears.  He was recently at the beach and felt like he got some water [place] in the ear.  The ear feels little blocked he wanted it checked.Marland Kitchen  His wife also complains about his hearing..  Past Medical History:  Diagnosis Date  . Allergic rhinitis   . Allergy   . Asthma    50 years ago  . Giardia    Past Surgical History:  Procedure Laterality Date  . abdominal aorta ultrasound  07/22/2009   mild atherosclerotic changes without aneurysm  . COLONOSCOPY    . HAND SURGERY  age 79   for infection  . NASAL HEMORRHAGE CONTROL N/A 01/08/2017   Procedure: MINOR EPISTAXIS CONTROL;  Surgeon: Rozetta Nunnery, MD;  Location: [facility];  Service: ENT;  Laterality: N/A;  . NASAL SINUS SURGERY    . NM MYOVIEW LTD  03/19/2009   normal/ EF- 68%   Social History   Socioeconomic History  . Marital status: Married    Spouse name: Not on file  . Number of children: 3  . Years of education: Not on file  . Highest education level: Not on file  Occupational History  . Occupation: ENT surgeon  Tobacco Use  . Smoking status: Former Smoker    Types: Pipe  . Smokeless tobacco: Never Used  . Tobacco comment: 40 years ago  Substance and Sexual Activity  . Alcohol use: Yes    Comment: occasionally  . Drug use: No  . Sexual activity: Not on file  Other Topics Concern  . Not on file  Social History Narrative  . Not on file   Social Determinants of Health   Financial Resource Strain: Not on file  Food Insecurity: Not on file  Transportation Needs: Not on file  Physical Activity: Not on file  Stress: Not on file  Social Connections: Not on file   Family History  Problem Relation Age of Onset  . Aortic aneurysm Father 24  . Lung cancer Mother 61  . Allergic rhinitis Other        GM  . Hypertension Brother   . Pulmonary embolism Maternal Grandfather   . Heart attack Neg Hx   . Stroke Neg Hx   .  Colon cancer Neg Hx   . Colon polyps Neg Hx   . Esophageal cancer Neg Hx   . Rectal cancer Neg Hx   . Stomach cancer Neg Hx    Allergies  Allergen Reactions  . Apple   . Bee Venom Other (See Comments)    unknown  . Blackberry [Rubus Fruticosus]   . Onion   . Peach Flavor   . Pear   . Plum Pulp    Prior to Admission medications   Medication Sig Start Date End Date Taking? Authorizing Provider  azithromycin (ZITHROMAX) 500 MG tablet TAKE 1 TABLET BY MOUTH ONCE DAILY FOR 3 DAYS. 02/09/20 02/08/21  Rozetta Nunnery, MD  EPINEPHrine 0.3 mg/0.3 mL IJ SOAJ injection INJECT 0.3 MG INTO THE MUSCLE AS NEEDED FOR ANAPHYLAXIS. USE AS DIRECTED FOR LIFE-THREATENING ALLERGIC REACTION. 06/25/20 06/25/21  Kozlow, Donnamarie Poag, MD  fexofenadine (ALLEGRA) 180 MG tablet Take 180 mg by mouth.    [provider]  Multiple Vitamins-Minerals (MULTIVITAMIN ADULTS PO) Take by mouth.    [provider]  Naproxen Sodium 220 MG CAPS  08/08/19   [provider]  NONFORMULARY OR COMPOUNDED ITEM Allergy  Vaccine 1:10 Given at [facility]    [provider]  predniSONE (DELTASONE) 10 MG tablet TAKE 1 TABLET BY MOUTH 3 TIMES A DAY FOR 2 DAYS, 1 TAB 2 TIMES A DAY FOR 5 DAYS, 1 TAB DAILY UNTIL FINISHED 01/30/20 01/29/21  Latanya Maudlin, MD  metoprolol tartrate (LOPRESSOR) 50 MG tablet Take 1 tablet by mouth once for procedure. Patient not taking: Reported on 06/25/2020 01/23/20 06/25/20  Geralynn Rile, MD     Positive ROS: Otherwise negative  All other systems have been reviewed and were otherwise negative with the exception of those mentioned in the HPI and as above.  Physical Exam: Constitutional: Alert, well-appearing, no acute distress Ears: External ears without lesions or tenderness.  He has severe right ear canal exostosis obstructing 90% of the canal adjacent to the TM.  The left ear canal reveals minimal exostosis and is 90% unobstructed.  He had slight wax buildup  but this was very minimal and just thin skin.  This was removed with forceps and curettes.  The TMs appear clear bilaterally with nothing adjacent to the TMs on either side.  On hearing screening with a 1024 tuning fork had a mild hearing loss in both ears which was symmetric. Nasal: External nose without lesions. Clear nasal passages Oral: Lips and gums without lesions. Tongue and palate mucosa without lesions. Posterior oropharynx clear. Neck: No palpable adenopathy or masses Respiratory: Breathing comfortably  Skin: No facial/neck lesions or rash noted.  Procedures  Assessment: Mild bilateral sensorineural hearing loss.  But symmetric hearing on tuning fork testing. Severe right ear bony exostosis.  Plan: I discussed with him concerning essentially clear ear canals bilaterally with no significant wax buildup.  No obstructing lesions noted except for severe right ear canal bony exostosis.  He does have some underlying hearing loss and might benefit from getting audiologic testing.   [name], MD

## 2020-08-15 ENCOUNTER — Ambulatory Visit (INDEPENDENT_AMBULATORY_CARE_PROVIDER_SITE_OTHER): Payer: Medicare Other | Admitting: *Deleted

## 2020-08-15 DIAGNOSIS — J309 Allergic rhinitis, unspecified: Secondary | ICD-10-CM | POA: Diagnosis not present

## 2020-08-19 ENCOUNTER — Telehealth: Payer: Self-pay | Admitting: Allergy and Immunology

## 2020-08-19 ENCOUNTER — Ambulatory Visit (INDEPENDENT_AMBULATORY_CARE_PROVIDER_SITE_OTHER): Payer: Medicare Other

## 2020-08-19 DIAGNOSIS — R059 Cough, unspecified: Secondary | ICD-10-CM

## 2020-08-19 DIAGNOSIS — J309 Allergic rhinitis, unspecified: Secondary | ICD-10-CM | POA: Diagnosis not present

## 2020-08-19 NOTE — Telephone Encounter (Signed)
Pt came in asking for a steroid sample, pt stated that Dr. Neldon Mc had offered sample at last visit. I was unable to find anything in pt's chart, I asked nurse hear in [place] & was directed to call Dr. Neldon Mc in [place]. I spoke to [place] who asked me to complete telephone contact. I spoke to pt and let him know I was sending a message back.  Pt states he still has cough and some wheezing, pt has tightness in chest when exercising. Pt was asking to speak to Dr. Neldon Mc. Pt states he will return tomorrow to speak to Dr. Neldon Mc.

## 2020-08-20 ENCOUNTER — Other Ambulatory Visit (HOSPITAL_COMMUNITY): Payer: Self-pay

## 2020-08-20 DIAGNOSIS — J301 Allergic rhinitis due to pollen: Secondary | ICD-10-CM | POA: Diagnosis not present

## 2020-08-20 MED ORDER — FLOVENT HFA 110 MCG/ACT IN AERO
2.0000 | INHALATION_SPRAY | Freq: Two times a day (BID) | RESPIRATORY_TRACT | 6 refills | Status: DC
  Filled 2020-08-20: qty 12, 30d supply, fill #0

## 2020-08-20 MED ORDER — ROSUVASTATIN CALCIUM 10 MG PO TABS
10.0000 mg | ORAL_TABLET | Freq: Every day | ORAL | 11 refills | Status: DC
  Filled 2020-08-20: qty 30, 30d supply, fill #0

## 2020-08-20 MED ORDER — ALBUTEROL SULFATE HFA 108 (90 BASE) MCG/ACT IN AERS
2.0000 | INHALATION_SPRAY | RESPIRATORY_TRACT | 6 refills | Status: DC | PRN
  Filled 2020-08-20: qty 8.5, 17d supply, fill #0

## 2020-08-20 MED ORDER — AZITHROMYCIN 500 MG PO TABS
500.0000 mg | ORAL_TABLET | Freq: Every day | ORAL | 0 refills | Status: DC
  Filled 2020-08-20: qty 3, 3d supply, fill #0

## 2020-08-20 NOTE — Telephone Encounter (Signed)
I discussed this issue with Dr. Ernesto Rutherford.  Every so many years he develops an episode with coughing and some sputum production.  He does not have any exercise-induced limitation and does not develop shortness of breath or chest tightness when he exerts himself.  He just played golf for several hours and will did not have any difficulty.  He was evaluated by his primary care doctor within the past week and apparently his exam came out okay and it was felt that there was no indication for obtaining a chest x-ray.  He was given an albuterol inhaler and a prescription for an inhaled steroid.  Unfortunately, his inhaled steroid prescription with was going to cost close to $250.  He arrives today asking if he could have a sample of an inhaled steroid.  I gave him Alvesco 160 at a dose of 1 inhalation twice a day.  He will utilize this over the course of the past month and hopefully his slight respiratory tract flare involving his lower airways will be inactive at that point in time.  He can continue to use albuterol MDI if needed although he does not like to use this medication because it makes him very jittery.  If he has an inappropriate response to the use of Alvesco then he needs further evaluation.

## 2020-08-20 NOTE — Progress Notes (Signed)
VIALS EXP 08-20-21 

## 2020-08-21 DIAGNOSIS — J3089 Other allergic rhinitis: Secondary | ICD-10-CM | POA: Diagnosis not present

## 2020-08-26 ENCOUNTER — Other Ambulatory Visit: Payer: Self-pay

## 2020-08-26 ENCOUNTER — Ambulatory Visit
Admission: RE | Admit: 2020-08-26 | Discharge: 2020-08-26 | Disposition: A | Discharge status: Home / Self Care | Payer: Medicare Other | Source: Ambulatory Visit | Attending: Allergy and Immunology | Admitting: Allergy and Immunology

## 2020-08-26 NOTE — Telephone Encounter (Signed)
Patient called stating that his cough was not better and that he wanted a chest X Ray. Called and spoke with Dr. Neldon Mc and received verbal consent to order a chest x ray. Order has been placed. Called and advised to patient. Patient verbalized understanding.

## 2020-09-03 ENCOUNTER — Ambulatory Visit (INDEPENDENT_AMBULATORY_CARE_PROVIDER_SITE_OTHER): Payer: Medicare Other | Admitting: *Deleted

## 2020-09-03 DIAGNOSIS — J309 Allergic rhinitis, unspecified: Secondary | ICD-10-CM

## 2020-10-03 ENCOUNTER — Ambulatory Visit (INDEPENDENT_AMBULATORY_CARE_PROVIDER_SITE_OTHER): Payer: Medicare Other | Admitting: *Deleted

## 2020-10-03 ENCOUNTER — Telehealth: Payer: Self-pay | Admitting: *Deleted

## 2020-10-03 DIAGNOSIS — J309 Allergic rhinitis, unspecified: Secondary | ICD-10-CM | POA: Diagnosis not present

## 2020-10-03 NOTE — Telephone Encounter (Signed)
Would it be ok for the patient to build up on .10, .30, .50 schedule when building up on new Red vials?

## 2020-10-03 NOTE — Telephone Encounter (Signed)
Patient's allergy flow sheet has been updated to reflect these changes.  

## 2020-10-08 ENCOUNTER — Ambulatory Visit (INDEPENDENT_AMBULATORY_CARE_PROVIDER_SITE_OTHER): Payer: Medicare Other | Admitting: *Deleted

## 2020-10-08 DIAGNOSIS — J309 Allergic rhinitis, unspecified: Secondary | ICD-10-CM

## 2020-10-14 ENCOUNTER — Ambulatory Visit (INDEPENDENT_AMBULATORY_CARE_PROVIDER_SITE_OTHER): Payer: Medicare Other

## 2020-10-14 DIAGNOSIS — J309 Allergic rhinitis, unspecified: Secondary | ICD-10-CM | POA: Diagnosis not present

## 2021-01-07 ENCOUNTER — Ambulatory Visit (INDEPENDENT_AMBULATORY_CARE_PROVIDER_SITE_OTHER): Payer: Medicare Other | Admitting: *Deleted

## 2021-01-07 DIAGNOSIS — J309 Allergic rhinitis, unspecified: Secondary | ICD-10-CM | POA: Diagnosis not present

## 2021-01-13 ENCOUNTER — Ambulatory Visit (INDEPENDENT_AMBULATORY_CARE_PROVIDER_SITE_OTHER): Payer: Medicare Other

## 2021-01-13 DIAGNOSIS — J309 Allergic rhinitis, unspecified: Secondary | ICD-10-CM

## 2021-01-17 ENCOUNTER — Other Ambulatory Visit (HOSPITAL_COMMUNITY): Payer: Self-pay

## 2021-01-20 ENCOUNTER — Telehealth: Payer: Self-pay

## 2021-01-20 ENCOUNTER — Other Ambulatory Visit (HOSPITAL_COMMUNITY): Payer: Self-pay

## 2021-01-20 ENCOUNTER — Ambulatory Visit (INDEPENDENT_AMBULATORY_CARE_PROVIDER_SITE_OTHER): Payer: Medicare Other

## 2021-01-20 DIAGNOSIS — J309 Allergic rhinitis, unspecified: Secondary | ICD-10-CM | POA: Diagnosis not present

## 2021-01-20 MED ORDER — DICLOFENAC SODIUM 75 MG PO TBEC
75.0000 mg | DELAYED_RELEASE_TABLET | Freq: Two times a day (BID) | ORAL | 1 refills | Status: DC
  Filled 2021-01-20: qty 60, 30d supply, fill #0

## 2021-01-20 NOTE — Telephone Encounter (Signed)
   [facility] Pre-operative Risk Assessment    Patient Name: Harry [name]  DOB: [birth year] MRN: [id number]  HEARTCARE STAFF:  - IMPORTANT!!!!!! Under Visit Info/Reason for Call, type in Other and utilize the format Clearance MM/DD/YY or Clearance TBD. Do not use dashes or single digits. - Please review there is not already an duplicate clearance open for this procedure. - If request is for dental extraction, please clarify the # of teeth to be extracted. - If the patient is currently at the dentist's office, call Pre-Op Callback Staff (MA/nurse) to input urgent request.  - If the patient is not currently in the dentist office, please route to the Pre-Op pool.  Request for surgical clearance:  What type of surgery is being performed? Right Total Hip Arthroplasty   When is this surgery scheduled? 01/30/21  What type of clearance is required (medical clearance vs. Pharmacy clearance to hold med vs. Both)? MEDICAL    Are there any medications that need to be held prior to surgery and how long? N/A  Practice name and name of physician performing surgery? Dr. Paralee Cancel [facility]  What is the office phone number? [phone]   7.   What is the office fax number? [place]  8.   Anesthesia type (None, local, MAC, general) ? Spinal    Harry [name] Harry [name] 01/20/2021, 11:34 AM  _________________________________________________________________   (provider comments below)

## 2021-01-20 NOTE — Telephone Encounter (Signed)
Pt has been schedule to see Thana Ates, PA-C, 01/27/2021, and clearance will be addressed at that time.  Will route back to the requesting surgeon's office to make them aware.

## 2021-01-20 NOTE — Telephone Encounter (Signed)
Primary Cardiologist:[place] Doreatha Lew, MD  Chart reviewed as part of pre-operative protocol coverage. Because of Harry [name]'s past medical history and time since last visit, he/she will require a follow-up visit in order to better assess preoperative cardiovascular risk.  Pre-op covering staff: - Please schedule appointment and call patient to inform them. - Please contact requesting surgeon's office via preferred method (i.e, phone, fax) to inform them of need for appointment prior to surgery.  If applicable, this message will also be routed to pharmacy pool and/or primary cardiologist for input on holding anticoagulant/antiplatelet agent as requested below so that this information is available at time of patient's appointment.   Deberah Pelton, NP  01/20/2021, 11:39 AM

## 2021-01-21 ENCOUNTER — Ambulatory Visit: Payer: Medicare Other | Admitting: Medical

## 2021-01-25 NOTE — Progress Notes (Signed)
Cardiology Office Note   Date:  01/27/2021   ID:  Harlon, Kutner 09-26-1940, MRN [id number]  PCP:  Crist Infante, MD  Cardiologist:  Dr. Audie Box  No chief complaint on file.    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 Mechele Claude with [facility].  He was last seen in our office by Dr. Davina Poke on 01/23/2020 in the setting of progressive dyspnea on exertion and unspecified right-sided chest pain.  The patient had a coronary CTA planned, and began a 7-day Zio patch to evaluate for complaints of palpitations in addition to his shortness of breath.  Coronary CTA was completed on 01/26/2020 with a calcium score of 72, in the 21st percentile for age and sex matched control, normal coronary origin with right dominance, minimal, nonobstructive CAD in the LAD and RCA less than 25%.  He was recommended for nonatherosclerotic causes of his chest pain and shortness of breath, and was recommended also for risk factor modification.  The patient's Zio monitor on 02/01/2020, revealed brief ectopic atrial tachycardic episodes (14 episodes in 7 days with the longest of 4.3 seconds).  The patient had frequent PACs, occasional PVCs, no evidence of atrial fibrillation, first-degree AV block was present but found to be benign.  He was started on metoprolol 25 mg daily with an echocardiogram ordered for diagnosis of PACs.  The patient was given this information and he was not interested in pursuing either metoprolol or echocardiogram.  He comes today without any cardia complaints. He states that he got tired of waiting for the elevator at the [facility] office to comes so he took the stairs up 3 flights. No chest pain or shortness of breath, some soreness in his right hip and knee.   He has chosen not to take statin medication at this time.   Past Medical History:  Diagnosis  Date   Allergic rhinitis    Allergy    Asthma    50 years ago   Giardia     Past Surgical History:  Procedure Laterality Date   abdominal aorta ultrasound  07/22/2009   mild atherosclerotic changes without aneurysm   COLONOSCOPY     HAND SURGERY  age 38   for infection   NASAL HEMORRHAGE CONTROL N/A 01/08/2017   Procedure: MINOR EPISTAXIS CONTROL;  Surgeon: Rozetta Nunnery, MD;  Location: [facility];  Service: ENT;  Laterality: N/A;   NASAL SINUS SURGERY     NM MYOVIEW LTD  03/19/2009   normal/ EF- 68%     Current Outpatient Medications  Medication Sig Dispense Refill   acetaminophen (TYLENOL) 500 MG tablet Take 500 mg by mouth every 6 (six) hours as needed for moderate pain.     NONFORMULARY OR COMPOUNDED ITEM Allergy Vaccine 1:10 Given at [facility]     albuterol (VENTOLIN HFA) 108 (90 Base) MCG/ACT inhaler Inhale 2 puffs into the lungs every 4 (four) hours as needed for wheeze, cough, or shortness of breath. (Patient not taking: No sig reported) 8.5 g 6   azithromycin (ZITHROMAX) 500 MG tablet TAKE 1 TABLET BY MOUTH ONCE DAILY FOR 3 DAYS. (Patient not taking: No sig reported) 3 tablet 1   azithromycin (ZITHROMAX) 500 MG tablet Take 1 tablet (500 mg total) by mouth daily. (Patient not taking: No sig reported) 3 tablet 0   diclofenac (VOLTAREN) 75 MG EC tablet  Take 1 tablet (75 mg total) by mouth 2 (two) times daily. (Patient not taking: No sig reported) 60 tablet 1   EPINEPHrine 0.3 mg/0.3 mL IJ SOAJ injection INJECT 0.3 MG INTO THE MUSCLE AS NEEDED FOR ANAPHYLAXIS. USE AS DIRECTED FOR LIFE-THREATENING ALLERGIC REACTION. 2 each 1   fluticasone (FLOVENT HFA) 110 MCG/ACT inhaler Inhale 2 puffs into the lungs 2 (two) times daily and rinse with water after use (Patient not taking: No sig reported) 12 g 6   predniSONE (DELTASONE) 10 MG tablet TAKE 1 TABLET BY MOUTH 3 TIMES A DAY FOR 2 DAYS, 1 TAB 2 TIMES A DAY FOR 5 DAYS, 1 TAB DAILY UNTIL FINISHED (Patient  not taking: No sig reported) 21 tablet 0   rosuvastatin (CRESTOR) 10 MG tablet Take 1 tablet (10 mg total) by mouth daily. (Patient not taking: No sig reported) 30 tablet 11   Current Facility-Administered Medications  Medication Dose Route Frequency Provider Last Rate Last Admin   0.9 %  sodium chloride infusion  500 mL Intravenous Once Ladene Artist, MD        Allergies:   Apple, Bee venom, Blackberry [rubus fruticosus], Onion, Peach flavor, Pear, and Plum pulp    Social History:  The patient  reports that he has quit smoking. His smoking use included pipe. He has never used smokeless tobacco. He reports current alcohol use. He reports that he does not use drugs.   Family History:  The patient's family history includes Allergic rhinitis in an other family member; Aortic aneurysm (age of onset: 34) in his father; Hypertension in his brother; Lung cancer (age of onset: 17) in his mother; Pulmonary embolism in his maternal grandfather.    ROS: All other systems are reviewed and negative. Unless otherwise mentioned in H&P    PHYSICAL EXAM: VS:  BP (!) 142/72 (BP Location: Left Arm, Patient Position: Sitting, Cuff Size: Normal)   Pulse 66   Ht 6\' 3"  (1.905 m)   Wt 203 lb 4.8 oz (92.2 kg)   SpO2 98%   BMI 25.41 kg/m  , BMI Body mass index is 25.41 kg/m. GEN: Well nourished, well developed, in no acute distress HEENT: normal Neck: no JVD, carotid bruits, or masses Cardiac: RRR, tachycardic ; no murmurs, rubs, or gallops,no edema  Respiratory:  Clear to auscultation bilaterally, normal work of breathing GI: soft, nontender, nondistended, + BS MS: no deformity or atrophy Skin: warm and dry, no rash, dryness of skin in ankle and lower extremities.  Neuro:  Strength and sensation are intact Psych: euthymic mood, full affect   EKG:  EKG is ordered today. The ekg ordered today demonstrated (Personally reviewed) NSR with 1 degree AVB, rate of 66 bpm.  No STTW abnormalities.     Recent Labs: No results found for requested labs within last 8760 hours.    Lipid Panel    Component Value Date/Time   CHOL 206 (H) 05/17/2012 0817   TRIG 68.0 05/17/2012 0817   HDL 46.80 05/17/2012 0817   CHOLHDL 4 05/17/2012 0817   VLDL 13.6 05/17/2012 0817   LDLDIRECT 145.5 05/17/2012 0817      Wt Readings from Last 3 Encounters:  01/27/21 203 lb 4.8 oz (92.2 kg)  07/04/20 202 lb (91.6 kg)  06/25/20 202 lb (91.6 kg)      Other studies Reviewed:  Coronary CTA 01/23/2020 FINDINGS: Image quality: excellent.  Noise artifact is: Limited.  Coronary Arteries: Normal coronary origin. Right dominance.  Left main: The left main is  a large caliber vessel with a normal take off from the left coronary cusp that bifurcates to form a left anterior descending artery and a left circumflex artery. There is no plaque or stenosis.  Left anterior descending artery: The proximal LAD contains minimal calcified plaque (<25%. The mid LAD contains minimal calcified plaque (<25%). The distal LAD is patent. The first diagonal has a high take off and is normal. The second diagonal is patent.  Left circumflex artery: The LCX is non-dominant and patent with no evidence of plaque or stenosis. The LCX gives off 2 patent obtuse marginal branches.  Right coronary artery: The RCA is dominant with normal take off from the right coronary cusp. The proximal RCA contains minimal calcified plaque (<25%). The mid RCA contains minimal non-calcified plaque (<25%). The distal RCA contains minimal calcified plaque (<25%). The RCA terminates as a PDA and right posterolateral branch without evidence of plaque or stenosis.  Right Atrium: Right atrial size is within normal limits.  Right Ventricle: The right ventricular cavity is within normal limits.  Left Atrium: Left atrial size is normal in size with no left atrial appendage filling defect.  Left Ventricle: The ventricular cavity size is  within normal limits. There are no stigmata of prior infarction. There is no abnormal filling defect.  Pulmonary arteries: Normal in size without proximal filling defect.  Pulmonary veins: Normal pulmonary venous drainage.  Pericardium: Normal thickness with no significant effusion or calcium present.  Cardiac valves: The aortic valve is trileaflet without significant calcification. The mitral valve is normal structure with mild mitral annular calcification.  Aorta: Trace aortic root calcification. Normal caliber with no significant disease.  Extra-cardiac findings: See attached radiology report for non-cardiac structures.  IMPRESSION: 1. Coronary calcium score of 72. This was 21st percentile for age and sex matched controls.  2. Normal coronary origin with right dominance.  3. Minimal, non-obstructive CAD in the LAD and RCA (<25%).  RECOMMENDATIONS: 1. Minimal non-obstructive CAD (0-24%). Consider non-atherosclerotic causes of chest pain/shortness of breath. Consider preventive therapy and risk factor modification.  Eleonore Chiquito, MD    ASSESSMENT AND PLAN:  1.  Pre-Operative Cardiac Evaluation. He is doing well from cardiac standpoint. Walked up 3 flights of stairs to the office today.  Able to complete >4.0 METS. Per Revised Cardiac Risk Index, consider high risk with >11% chance of adverse cardiac events perioperatively . Will send this to Dr. Alvan Dame for pre-operative documentation.  2. Hypercholesterolemia:  Has labs completed by PCP.  He was offered statin therapy by Dr. Audie Box but has decided against taking. I do no have any updated labs. He will continue with Dr. Selena Lesser for management and survelience   3. CAD: Minimal on cardiac CTA completed in 10/05//21  Current medicines are reviewed at length with the patient today.  I have spent 25 minutes dedicated to the care of this patient on the date of this encounter to include pre-visit review of records, assessment,  management and diagnostic testing,with shared decision making.  Labs/ tests ordered today include: None  Phill Myron. West Pugh, ANP, AACC   01/27/2021 9:40 AM    [facility] [place] [address] Office [phone] Fax [phone]  Notice: This dictation was prepared with Dragon dictation along with smaller phrase technology. Any transcriptional errors that result from this process are unintentional and may not be corrected upon review.

## 2021-01-27 ENCOUNTER — Ambulatory Visit (INDEPENDENT_AMBULATORY_CARE_PROVIDER_SITE_OTHER): Payer: Medicare Other

## 2021-01-27 ENCOUNTER — Ambulatory Visit (INDEPENDENT_AMBULATORY_CARE_PROVIDER_SITE_OTHER): Payer: Medicare Other | Admitting: Adult Health

## 2021-01-27 ENCOUNTER — Other Ambulatory Visit: Payer: Self-pay

## 2021-01-27 ENCOUNTER — Encounter: Payer: Self-pay | Admitting: Adult Health

## 2021-01-27 VITALS — BP 142/72 | HR 66 | Ht 75.0 in | Wt 203.3 lb

## 2021-01-27 DIAGNOSIS — E7800 Pure hypercholesterolemia, unspecified: Secondary | ICD-10-CM | POA: Diagnosis not present

## 2021-01-27 DIAGNOSIS — I2510 Atherosclerotic heart disease of native coronary artery without angina pectoris: Secondary | ICD-10-CM | POA: Diagnosis not present

## 2021-01-27 DIAGNOSIS — J309 Allergic rhinitis, unspecified: Secondary | ICD-10-CM | POA: Diagnosis not present

## 2021-01-27 DIAGNOSIS — Z01818 Encounter for other preprocedural examination: Secondary | ICD-10-CM

## 2021-01-27 NOTE — Patient Instructions (Signed)
DUE TO COVID-19 ONLY ONE VISITOR IS ALLOWED TO COME WITH YOU AND STAY IN THE WAITING ROOM ONLY DURING PRE OP AND PROCEDURE DAY OF SURGERY IF YOU ARE GOING HOME AFTER SURGERY. IF YOU ARE SPENDING THE NIGHT 2 PEOPLE MAY VISIT WITH YOU IN YOUR PRIVATE ROOM AFTER SURGERY UNTIL VISITING  HOURS ARE OVER AT 8:00 PM AND 1 VISITOR CAN SPEND THE NIGHT.   YOU NEED TO HAVE A COVID 19 TEST ON_10/11___ THIS TEST MUST BE DONE BEFORE SURGERY,  COVID TESTING SITE  IS LOCATED AT [place], [place]. REMAIN IN YOUR CAR THIS IS A DRIVE UP TEST. AFTER YOUR COVID TEST PLEASE WEAR A MASK OUT IN PUBLIC AND SOCIAL DISTANCE AND Harry [name] YOUR HANDS FREQUENTLY, ALSO ASK ALL YOUR CLOSE CONTACT PERSONS TO WEAR A MASK AND SOCIAL DISTANCE AND Harry [name] THEIR HANDS FREQUENTLY ALSO.               Harry [name]     Your procedure is scheduled on: 01/30/21   Report to [facility] Main  Entrance   Report to short stay at 5:15 AM     Call this number if you have problems the morning of surgery [phone]   No food after midnight.    You may have clear liquid until 4:30 AM.    At 4:00 AM drink pre surgery drink.   Nothing by mouth after 4:30 AM.    CLEAR LIQUID DIET   Foods Allowed                                                                     Foods Excluded                                                                                       liquids that you cannot  Plain Jell-O any favor except red or purple                                           see through such as: Fruit ices (not with fruit pulp)                                     milk, soups, orange juice  Iced Popsicles                                    All solid food Carbonated beverages, regular and diet                                    Cranberry, grape  and apple juices Sports drinks like Gatorade Lightly seasoned clear broth or consume(fat free) Sugar     BRUSH YOUR TEETH MORNING OF SURGERY AND RINSE YOUR MOUTH OUT, NO  CHEWING GUM CANDY OR MINTS.     Take these medicines the morning of surgery with A SIP OF WATER: none                                You may not have any metal on your body including              piercings  Do not wear jewelry,  lotions, powders or deodorant                          Men may shave face and neck.   Do not bring valuables to the hospital. [place].  Contacts, dentures or bridgework may not be worn into surgery.       _____________________________________________________________________             [facility] - Preparing for Surgery Before surgery, you can play an important role.  Because skin is not sterile, your skin needs to be as free of germs as possible.  You can reduce the number of germs on your skin by washing with CHG (chlorahexidine gluconate) soap before surgery.  CHG is an antiseptic cleaner which kills germs and bonds with the skin to continue killing germs even after washing. Please DO NOT use if you have an allergy to CHG or antibacterial soaps.  If your skin becomes reddened/irritated stop using the CHG and inform your nurse when you arrive at Short Stay.  You may shave your face/neck. Please follow these instructions carefully:  1.  Shower with CHG Soap the night before surgery and the  morning of Surgery.  2.  If you choose to wash your hair, wash your hair first as usual with your  normal  shampoo.  3.  After you shampoo, rinse your hair and body thoroughly to remove the  shampoo.                            4.  Use CHG as you would any other liquid soap.  You can apply chg directly  to the skin and wash                       Gently with a scrungie or clean washcloth.  5.  Apply the CHG Soap to your body ONLY FROM THE NECK DOWN.   Do not use on face/ open                           Wound or open sores. Avoid contact with eyes, ears mouth and genitals (private parts).                       Wash face,   Genitals (private parts) with your normal soap.             6.  Wash thoroughly, paying special attention to the area where your surgery  will be performed.  7.  Thoroughly rinse your body with warm water from the neck  down.  8.  DO NOT shower/wash with your normal soap after using and rinsing off  the CHG Soap.                9.  Pat yourself dry with a clean towel.            10.  Wear clean pajamas.            11.  Place clean sheets on your bed the night of your first shower and do not  sleep with pets. Day of Surgery : Do not apply any lotions/deodorants the morning of surgery.  Please wear clean clothes to the hospital/surgery center.  FAILURE TO FOLLOW THESE INSTRUCTIONS MAY RESULT IN THE CANCELLATION OF YOUR SURGERY PATIENT SIGNATURE_________________________________  NURSE SIGNATURE__________________________________  ________________________________________________________________________   Harry [name]  An incentive spirometer is a tool that can help keep your lungs clear and active. This tool measures how well you are filling your lungs with each breath. Taking long deep breaths may help reverse or decrease the chance of developing breathing (pulmonary) problems (especially infection) following: A long period of time when you are unable to move or be active. BEFORE THE PROCEDURE  If the spirometer includes an indicator to show your best effort, your nurse or respiratory therapist will set it to a desired goal. If possible, sit up straight or lean slightly forward. Try not to slouch. Hold the incentive spirometer in an upright position. INSTRUCTIONS FOR USE  Sit on the edge of your bed if possible, or sit up as far as you can in bed or on a chair. Hold the incentive spirometer in an upright position. Breathe out normally. Place the mouthpiece in your mouth and seal your lips tightly around it. Breathe in slowly and as deeply as possible, raising the piston or the ball  toward the top of the column. Hold your breath for 3-5 seconds or for as long as possible. Allow the piston or ball to fall to the bottom of the column. Remove the mouthpiece from your mouth and breathe out normally. Rest for a few seconds and repeat Steps 1 through 7 at least 10 times every 1-2 hours when you are awake. Take your time and take a few normal breaths between deep breaths. The spirometer may include an indicator to show your best effort. Use the indicator as a goal to work toward during each repetition. After each set of 10 deep breaths, practice coughing to be sure your lungs are clear. If you have an incision (the cut made at the time of surgery), support your incision when coughing by placing a pillow or rolled up towels firmly against it. Once you are able to get out of bed, walk around indoors and cough well. You may stop using the incentive spirometer when instructed by your caregiver.  RISKS AND COMPLICATIONS Take your time so you do not get dizzy or light-headed. If you are in pain, you may need to take or ask for pain medication before doing incentive spirometry. It is harder to take a deep breath if you are having pain. AFTER USE Rest and breathe slowly and easily. It can be helpful to keep track of a log of your progress. Your caregiver can provide you with a simple table to help with this. If you are using the spirometer at home, follow these instructions: [place] IF:  You are having difficultly using the spirometer. You have trouble using the spirometer as often as instructed. Your  pain medication is not giving enough relief while using the spirometer. You develop fever of 100.5 F (38.1 C) or higher. SEEK IMMEDIATE MEDICAL CARE IF:  You cough up bloody sputum that had not been present before. You develop fever of 102 F (38.9 C) or greater. You develop worsening pain at or near the incision site. MAKE SURE YOU:  Understand these instructions. Will  watch your condition. Will get help right away if you are not doing well or get worse. Document Released: 08/17/2006 Document Revised: 06/29/2011 Document Reviewed: 10/18/2006 [facility] Patient Information 2014 [place], [place].   ________________________________________________________________________

## 2021-01-27 NOTE — Patient Instructions (Signed)
Medication Instructions:  No Changes *If you need a refill on your cardiac medications before your next appointment, please call your pharmacy*   Lab Work: No Labs If you have labs (blood work) drawn today and your tests are completely normal, you will receive your results only by: [place] (if you have MyChart) OR A paper copy in the mail If you have any lab test that is abnormal or we need to change your treatment, we will call you to review the results.   Testing/Procedures: No Testing   Follow-Up: At [facility], you and your health needs are our priority.  As part of our continuing mission to provide you with exceptional heart care, we have created designated Provider Care Teams.  These Care Teams include your primary Cardiologist (physician) and Advanced Practice Providers (APPs -  Physician Assistants and Nurse Practitioners) who all work together to provide you with the care you need, when you need it.  We recommend signing up for the patient portal called "MyChart".  Sign up information is provided on this After Visit Summary.  MyChart is used to connect with patients for Virtual Visits (Telemedicine).  Patients are able to view lab/test results, encounter notes, upcoming appointments, etc.  Non-urgent messages can be sent to your provider as well.   To learn more about what you can do with MyChart, go to [email or web address].    Your next appointment:   1 year(s)  The format for your next appointment:   In Person  Provider:   Eleonore Chiquito, MD

## 2021-01-28 ENCOUNTER — Encounter (HOSPITAL_COMMUNITY)
Admission: RE | Admit: 2021-01-28 | Discharge: 2021-01-28 | Disposition: A | Discharge status: Home / Self Care | Payer: Medicare Other | Source: Ambulatory Visit | Attending: Orthopedic Surgery | Admitting: Orthopedic Surgery

## 2021-01-28 ENCOUNTER — Other Ambulatory Visit: Payer: Self-pay

## 2021-01-28 ENCOUNTER — Encounter (HOSPITAL_COMMUNITY): Payer: Self-pay

## 2021-01-28 ENCOUNTER — Other Ambulatory Visit: Payer: Self-pay | Admitting: Orthopedic Surgery

## 2021-01-28 DIAGNOSIS — Z20822 Contact with and (suspected) exposure to covid-19: Secondary | ICD-10-CM | POA: Diagnosis not present

## 2021-01-28 DIAGNOSIS — Z01818 Encounter for other preprocedural examination: Secondary | ICD-10-CM | POA: Diagnosis present

## 2021-01-28 LAB — COMPREHENSIVE METABOLIC PANEL
ALT: 20 U/L (ref 0–44)
AST: 19 U/L (ref 15–41)
Albumin: 4.2 g/dL (ref 3.5–5.0)
Alkaline Phosphatase: 71 U/L (ref 38–126)
Anion gap: 7 (ref 5–15)
BUN: 24 mg/dL — ABNORMAL HIGH (ref 8–23)
CO2: 26 mmol/L (ref 22–32)
Calcium: 9.1 mg/dL (ref 8.9–10.3)
Chloride: 104 mmol/L (ref 98–111)
Creatinine, Ser: 0.73 mg/dL (ref 0.61–1.24)
GFR, Estimated: >60 mL/min (ref >60)
Glucose, Bld: 85 mg/dL (ref 70–99)
Potassium: 4.3 mmol/L (ref 3.5–5.1)
Sodium: 137 mmol/L (ref 135–145)
Total Bilirubin: 0.7 mg/dL (ref 0.3–1.2)
Total Protein: 7.1 g/dL (ref 6.5–8.1)

## 2021-01-28 LAB — SURGICAL PCR SCREEN
MRSA, PCR: NEGATIVE
Staphylococcus aureus: POSITIVE — AB

## 2021-01-28 LAB — CBC
HCT: 45.1 % (ref 39.0–52.0)
Hemoglobin: 14.8 g/dL (ref 13.0–17.0)
MCH: 31.8 pg (ref 26.0–34.0)
MCHC: 32.8 g/dL (ref 30.0–36.0)
MCV: 97 fL (ref 80.0–100.0)
Platelets: 182 10*3/uL (ref 150–400)
RBC: 4.65 MIL/uL (ref 4.22–5.81)
RDW: 12 % (ref 11.5–15.5)
WBC: 5.9 10*3/uL (ref 4.0–10.5)
nRBC: 0 % (ref 0.0–0.2)

## 2021-01-28 NOTE — Progress Notes (Signed)
COVID test- 1011/22    PCP - Dr. Jerilynn Mages. [place] Cardiologist - Dr. Viona Gilmore. [place] LOV 01/27/21  Chest x-ray - no EKG - no Stress Test - no ECHO - no Cardiac Cath - no Pacemaker/ICD device last checked:NA  Sleep Study - no CPAP -   Fasting Blood Sugar - NA Checks Blood Sugar _____ times a day  Blood Thinner Instructions:NA Aspirin Instructions: Last Dose:  Anesthesia review: no  Patient denies shortness of breath, fever, cough and chest pain at PAT appointment Pt is in great shape. No SOB with any activities.  Patient verbalized understanding of instructions that were given to them at the PAT appointment. Patient was also instructed that they will need to review over the PAT instructions again at home before surgery. yes

## 2021-01-29 LAB — SARS CORONAVIRUS 2 (TAT 6-24 HRS): SARS Coronavirus 2: NEGATIVE

## 2021-01-29 NOTE — Progress Notes (Signed)
PCR: + STAPH °

## 2021-01-29 NOTE — Anesthesia Preprocedure Evaluation (Addendum)
Anesthesia Evaluation  Patient identified by MRN, date of birth, ID band Patient awake    Reviewed: Allergy & Precautions, NPO status , Patient's Chart, lab work & pertinent test results  History of Anesthesia Complications Negative for: history of anesthetic complications  Airway Mallampati: II  TM Distance: >3 FB Neck ROM: Full    Dental no notable dental hx. (+) Dental Advisory Given   Pulmonary asthma , former smoker,    Pulmonary exam normal        Cardiovascular Normal cardiovascular exam     Neuro/Psych negative neurological ROS     GI/Hepatic negative GI ROS, Neg liver ROS,   Endo/Other  negative endocrine ROS  Renal/GU negative Renal ROS     Musculoskeletal negative musculoskeletal ROS (+)   Abdominal   Peds  Hematology negative hematology ROS (+)   Anesthesia Other Findings   Reproductive/Obstetrics                            Anesthesia Physical Anesthesia Plan  ASA: 2  Anesthesia Plan: Spinal and MAC   Post-op Pain Management:    Induction:   PONV Risk Score and Plan: Ondansetron and Propofol infusion  Airway Management Planned: Natural Airway and Simple Face Mask  Additional Equipment:   Intra-op Plan:   Post-operative Plan:   Informed Consent: I have reviewed the patients History and Physical, chart, labs and discussed the procedure including the risks, benefits and alternatives for the proposed anesthesia with the patient or authorized representative who has indicated his/her understanding and acceptance.     Dental advisory given  Plan Discussed with: Anesthesiologist and CRNA  Anesthesia Plan Comments:        Anesthesia Quick Evaluation

## 2021-01-30 ENCOUNTER — Other Ambulatory Visit: Payer: Self-pay

## 2021-01-30 ENCOUNTER — Ambulatory Visit (HOSPITAL_COMMUNITY): Payer: Medicare Other

## 2021-01-30 ENCOUNTER — Ambulatory Visit (HOSPITAL_COMMUNITY): Payer: Medicare Other | Admitting: Certified Registered Nurse Anesthetist

## 2021-01-30 ENCOUNTER — Observation Stay (HOSPITAL_COMMUNITY): Payer: Medicare Other

## 2021-01-30 ENCOUNTER — Observation Stay (HOSPITAL_COMMUNITY)
Admission: RE | Admit: 2021-01-30 | Discharge: 2021-01-31 | Disposition: A | Discharge status: Home / Self Care | Payer: Medicare Other | Source: Ambulatory Visit | Attending: Orthopedic Surgery | Admitting: Orthopedic Surgery

## 2021-01-30 ENCOUNTER — Encounter (HOSPITAL_COMMUNITY): Payer: Self-pay | Admitting: Orthopedic Surgery

## 2021-01-30 ENCOUNTER — Encounter (HOSPITAL_COMMUNITY)
Admission: RE | Disposition: A | Discharge status: Home / Self Care | Payer: Self-pay | Source: Ambulatory Visit | Attending: Orthopedic Surgery

## 2021-01-30 DIAGNOSIS — Z87891 Personal history of nicotine dependence: Secondary | ICD-10-CM | POA: Diagnosis not present

## 2021-01-30 DIAGNOSIS — M1611 Unilateral primary osteoarthritis, right hip: Secondary | ICD-10-CM | POA: Diagnosis not present

## 2021-01-30 DIAGNOSIS — J45909 Unspecified asthma, uncomplicated: Secondary | ICD-10-CM | POA: Diagnosis not present

## 2021-01-30 DIAGNOSIS — Z96649 Presence of unspecified artificial hip joint: Secondary | ICD-10-CM

## 2021-01-30 DIAGNOSIS — Z419 Encounter for procedure for purposes other than remedying health state, unspecified: Secondary | ICD-10-CM

## 2021-01-30 HISTORY — PX: TOTAL HIP ARTHROPLASTY: SHX124

## 2021-01-30 LAB — TYPE AND SCREEN
ABO/RH(D): O POS
Antibody Screen: NEGATIVE

## 2021-01-30 LAB — ABO/RH: ABO/RH(D): O POS

## 2021-01-30 SURGERY — ARTHROPLASTY, HIP, TOTAL, ANTERIOR APPROACH
Anesthesia: Monitor Anesthesia Care | Site: Hip | Laterality: Right

## 2021-01-30 MED ORDER — DEXAMETHASONE SODIUM PHOSPHATE 10 MG/ML IJ SOLN
10.0000 mg | Freq: Once | INTRAMUSCULAR | Status: AC
  Administered 2021-01-31: 10 mg via INTRAVENOUS
  Filled 2021-01-30: qty 1

## 2021-01-30 MED ORDER — LACTATED RINGERS IV SOLN: INTRAVENOUS | Status: DC

## 2021-01-30 MED ORDER — TRANEXAMIC ACID-NACL 1000-0.7 MG/100ML-% IV SOLN
1000.0000 mg | Freq: Once | INTRAVENOUS | Status: AC
  Administered 2021-01-30: 1000 mg via INTRAVENOUS
  Filled 2021-01-30: qty 100

## 2021-01-30 MED ORDER — ONDANSETRON HCL 4 MG PO TABS: 4.0000 mg | ORAL_TABLET | Freq: Four times a day (QID) | ORAL | Status: DC | PRN

## 2021-01-30 MED ORDER — AMISULPRIDE (ANTIEMETIC) 5 MG/2ML IV SOLN: 10.0000 mg | Freq: Once | INTRAVENOUS | Status: DC | PRN

## 2021-01-30 MED ORDER — ACETAMINOPHEN 325 MG PO TABS: 325.0000 mg | ORAL_TABLET | Freq: Four times a day (QID) | ORAL | Status: DC | PRN

## 2021-01-30 MED ORDER — PHENYLEPHRINE HCL (PRESSORS) 10 MG/ML IV SOLN
INTRAVENOUS | Status: AC
  Filled 2021-01-30: qty 2

## 2021-01-30 MED ORDER — DIPHENHYDRAMINE HCL 12.5 MG/5ML PO ELIX: 12.5000 mg | ORAL_SOLUTION | ORAL | Status: DC | PRN

## 2021-01-30 MED ORDER — METOCLOPRAMIDE HCL 5 MG PO TABS: 5.0000 mg | ORAL_TABLET | Freq: Three times a day (TID) | ORAL | Status: DC | PRN

## 2021-01-30 MED ORDER — PHENYLEPHRINE 40 MCG/ML (10ML) SYRINGE FOR IV PUSH (FOR BLOOD PRESSURE SUPPORT)
PREFILLED_SYRINGE | INTRAVENOUS | Status: AC
  Filled 2021-01-30: qty 10

## 2021-01-30 MED ORDER — BISACODYL 10 MG RE SUPP: 10.0000 mg | Freq: Every day | RECTAL | Status: DC | PRN

## 2021-01-30 MED ORDER — FENTANYL CITRATE (PF) 100 MCG/2ML IJ SOLN
INTRAMUSCULAR | Status: DC | PRN
  Administered 2021-01-30: 50 ug via INTRAVENOUS

## 2021-01-30 MED ORDER — EPINEPHRINE 0.3 MG/0.3ML IJ SOAJ
0.3000 mg | Freq: Once | INTRAMUSCULAR | Status: DC | PRN
  Filled 2021-01-30: qty 0.6

## 2021-01-30 MED ORDER — TRANEXAMIC ACID-NACL 1000-0.7 MG/100ML-% IV SOLN
1000.0000 mg | INTRAVENOUS | Status: AC
  Administered 2021-01-30: 1000 mg via INTRAVENOUS
  Filled 2021-01-30: qty 100

## 2021-01-30 MED ORDER — CELECOXIB 200 MG PO CAPS
200.0000 mg | ORAL_CAPSULE | Freq: Two times a day (BID) | ORAL | Status: DC
  Administered 2021-01-30 – 2021-01-31 (×2): 200 mg via ORAL
  Filled 2021-01-30 (×2): qty 1

## 2021-01-30 MED ORDER — PHENYLEPHRINE HCL-NACL 20-0.9 MG/250ML-% IV SOLN
INTRAVENOUS | Status: DC | PRN
  Administered 2021-01-30: 25 ug/min via INTRAVENOUS

## 2021-01-30 MED ORDER — ONDANSETRON HCL 4 MG/2ML IJ SOLN: 4.0000 mg | Freq: Four times a day (QID) | INTRAMUSCULAR | Status: DC | PRN

## 2021-01-30 MED ORDER — POLYETHYLENE GLYCOL 3350 17 G PO PACK: 17.0000 g | PACK | Freq: Every day | ORAL | Status: DC | PRN

## 2021-01-30 MED ORDER — MORPHINE SULFATE (PF) 2 MG/ML IV SOLN: 0.5000 mg | INTRAVENOUS | Status: DC | PRN

## 2021-01-30 MED ORDER — DEXAMETHASONE SODIUM PHOSPHATE 10 MG/ML IJ SOLN
INTRAMUSCULAR | Status: AC
  Filled 2021-01-30: qty 1

## 2021-01-30 MED ORDER — FERROUS SULFATE 325 (65 FE) MG PO TABS
325.0000 mg | ORAL_TABLET | Freq: Three times a day (TID) | ORAL | Status: DC
  Administered 2021-01-30 – 2021-01-31 (×3): 325 mg via ORAL
  Filled 2021-01-30 (×3): qty 1

## 2021-01-30 MED ORDER — PROPOFOL 10 MG/ML IV BOLUS
INTRAVENOUS | Status: DC | PRN
  Administered 2021-01-30 (×2): 20 mg via INTRAVENOUS

## 2021-01-30 MED ORDER — SODIUM CHLORIDE 0.9 % IR SOLN
Status: DC | PRN
  Administered 2021-01-30: 1000 mL

## 2021-01-30 MED ORDER — ONDANSETRON HCL 4 MG/2ML IJ SOLN
INTRAMUSCULAR | Status: AC
  Filled 2021-01-30: qty 2

## 2021-01-30 MED ORDER — FENTANYL CITRATE PF 50 MCG/ML IJ SOSY: 25.0000 ug | PREFILLED_SYRINGE | INTRAMUSCULAR | Status: DC | PRN

## 2021-01-30 MED ORDER — PHENOL 1.4 % MT LIQD: 1.0000 | OROMUCOSAL | Status: DC | PRN

## 2021-01-30 MED ORDER — TRAMADOL HCL 50 MG PO TABS
50.0000 mg | ORAL_TABLET | Freq: Four times a day (QID) | ORAL | Status: DC | PRN
Start: 2021-01-30 — End: 2021-01-31
  Administered 2021-01-30: 100 mg via ORAL
  Filled 2021-01-30: qty 2

## 2021-01-30 MED ORDER — DOCUSATE SODIUM 100 MG PO CAPS
100.0000 mg | ORAL_CAPSULE | Freq: Two times a day (BID) | ORAL | Status: DC
  Administered 2021-01-30 – 2021-01-31 (×2): 100 mg via ORAL
  Filled 2021-01-30 (×2): qty 1

## 2021-01-30 MED ORDER — ORAL CARE MOUTH RINSE: 15.0000 mL | Freq: Once | OROMUCOSAL | Status: AC

## 2021-01-30 MED ORDER — SODIUM CHLORIDE 0.9 % IV SOLN: INTRAVENOUS | Status: DC

## 2021-01-30 MED ORDER — CEFAZOLIN SODIUM-DEXTROSE 2-4 GM/100ML-% IV SOLN
2.0000 g | INTRAVENOUS | Status: AC
  Administered 2021-01-30: 2 g via INTRAVENOUS
  Filled 2021-01-30: qty 100

## 2021-01-30 MED ORDER — METHOCARBAMOL 500 MG PO TABS
500.0000 mg | ORAL_TABLET | Freq: Four times a day (QID) | ORAL | Status: DC | PRN
  Administered 2021-01-30 – 2021-01-31 (×2): 500 mg via ORAL
  Filled 2021-01-30 (×2): qty 1

## 2021-01-30 MED ORDER — PHENYLEPHRINE 40 MCG/ML (10ML) SYRINGE FOR IV PUSH (FOR BLOOD PRESSURE SUPPORT)
PREFILLED_SYRINGE | INTRAVENOUS | Status: DC | PRN
  Administered 2021-01-30: 40 ug via INTRAVENOUS
  Administered 2021-01-30 (×2): 80 ug via INTRAVENOUS

## 2021-01-30 MED ORDER — BUPIVACAINE IN DEXTROSE 0.75-8.25 % IT SOLN
INTRATHECAL | Status: DC | PRN
  Administered 2021-01-30: 2 mL via INTRATHECAL

## 2021-01-30 MED ORDER — METOCLOPRAMIDE HCL 5 MG/ML IJ SOLN: 5.0000 mg | Freq: Three times a day (TID) | INTRAMUSCULAR | Status: DC | PRN

## 2021-01-30 MED ORDER — DEXAMETHASONE SODIUM PHOSPHATE 10 MG/ML IJ SOLN
8.0000 mg | Freq: Once | INTRAMUSCULAR | Status: AC
  Administered 2021-01-30: 4 mg via INTRAVENOUS

## 2021-01-30 MED ORDER — METHOCARBAMOL 500 MG IVPB - SIMPLE MED
500.0000 mg | Freq: Four times a day (QID) | INTRAVENOUS | Status: DC | PRN
  Filled 2021-01-30: qty 50

## 2021-01-30 MED ORDER — FENTANYL CITRATE (PF) 100 MCG/2ML IJ SOLN
INTRAMUSCULAR | Status: AC
  Filled 2021-01-30: qty 2

## 2021-01-30 MED ORDER — MENTHOL 3 MG MT LOZG: 1.0000 | LOZENGE | OROMUCOSAL | Status: DC | PRN

## 2021-01-30 MED ORDER — EPHEDRINE 5 MG/ML INJ
INTRAVENOUS | Status: AC
  Filled 2021-01-30: qty 5

## 2021-01-30 MED ORDER — PHENYLEPHRINE HCL-NACL 20-0.9 MG/250ML-% IV SOLN
INTRAVENOUS | Status: AC
  Filled 2021-01-30: qty 250

## 2021-01-30 MED ORDER — HYDROCODONE-ACETAMINOPHEN 5-325 MG PO TABS
1.0000 | ORAL_TABLET | ORAL | Status: DC | PRN
Start: 2021-01-30 — End: 2021-01-31
  Administered 2021-01-30: 2 via ORAL
  Administered 2021-01-31: 1 via ORAL
  Filled 2021-01-30: qty 1
  Filled 2021-01-30: qty 2

## 2021-01-30 MED ORDER — CEFAZOLIN SODIUM-DEXTROSE 2-4 GM/100ML-% IV SOLN
2.0000 g | Freq: Four times a day (QID) | INTRAVENOUS | Status: AC
  Administered 2021-01-30 (×2): 2 g via INTRAVENOUS
  Filled 2021-01-30 (×2): qty 100

## 2021-01-30 MED ORDER — CHLORHEXIDINE GLUCONATE 0.12 % MT SOLN
15.0000 mL | Freq: Once | OROMUCOSAL | Status: AC
  Administered 2021-01-30: 15 mL via OROMUCOSAL

## 2021-01-30 MED ORDER — ACETAMINOPHEN 500 MG PO TABS
1000.0000 mg | ORAL_TABLET | Freq: Once | ORAL | Status: AC
  Administered 2021-01-30: 1000 mg via ORAL
  Filled 2021-01-30: qty 2

## 2021-01-30 MED ORDER — STERILE WATER FOR IRRIGATION IR SOLN
Status: DC | PRN
  Administered 2021-01-30: 2000 mL

## 2021-01-30 MED ORDER — POVIDONE-IODINE 10 % EX SWAB
2.0000 "application " | Freq: Once | CUTANEOUS | Status: AC
  Administered 2021-01-30: 2 via TOPICAL

## 2021-01-30 MED ORDER — ONDANSETRON HCL 4 MG/2ML IJ SOLN
INTRAMUSCULAR | Status: DC | PRN
  Administered 2021-01-30: 4 mg via INTRAVENOUS

## 2021-01-30 MED ORDER — CELECOXIB 200 MG PO CAPS
200.0000 mg | ORAL_CAPSULE | Freq: Once | ORAL | Status: AC
  Administered 2021-01-30: 200 mg via ORAL
  Filled 2021-01-30: qty 1

## 2021-01-30 MED ORDER — PROPOFOL 1000 MG/100ML IV EMUL
INTRAVENOUS | Status: AC
  Filled 2021-01-30: qty 100

## 2021-01-30 MED ORDER — PROMETHAZINE HCL 25 MG/ML IJ SOLN: 6.2500 mg | INTRAMUSCULAR | Status: DC | PRN

## 2021-01-30 MED ORDER — EPHEDRINE SULFATE-NACL 50-0.9 MG/10ML-% IV SOSY
PREFILLED_SYRINGE | INTRAVENOUS | Status: DC | PRN
  Administered 2021-01-30: 5 mg via INTRAVENOUS

## 2021-01-30 MED ORDER — ASPIRIN 81 MG PO CHEW
81.0000 mg | CHEWABLE_TABLET | Freq: Two times a day (BID) | ORAL | Status: DC
  Administered 2021-01-30 – 2021-01-31 (×2): 81 mg via ORAL
  Filled 2021-01-30 (×2): qty 1

## 2021-01-30 MED ORDER — PROPOFOL 500 MG/50ML IV EMUL
INTRAVENOUS | Status: DC | PRN
  Administered 2021-01-30: 75 ug/kg/min via INTRAVENOUS

## 2021-01-30 SURGICAL SUPPLY — 43 items
ADH SKN CLS APL DERMABOND .7 (GAUZE/BANDAGES/DRESSINGS) ×1
BAG COUNTER SPONGE SURGICOUNT (BAG) IMPLANT
BAG DECANTER FOR FLEXI CONT (MISCELLANEOUS) IMPLANT
BAG ZIPLOCK 12X15 (MISCELLANEOUS) IMPLANT
BLADE SAG 18X100X1.27 (BLADE) ×2 IMPLANT
COVER PERINEAL POST (MISCELLANEOUS) ×2 IMPLANT
COVER SURGICAL LIGHT HANDLE (MISCELLANEOUS) ×2 IMPLANT
CUP ACET PINNACLE SECTR 58MM (Hips) ×1 IMPLANT
DERMABOND ADVANCED (GAUZE/BANDAGES/DRESSINGS) ×1
DERMABOND ADVANCED .7 DNX12 (GAUZE/BANDAGES/DRESSINGS) ×1 IMPLANT
DRAPE FOOT SWITCH (DRAPES) ×2 IMPLANT
DRAPE STERI IOBAN 125X83 (DRAPES) ×2 IMPLANT
DRAPE U-SHAPE 47X51 STRL (DRAPES) ×4 IMPLANT
DRESSING AQUACEL AG SP 3.5X10 (GAUZE/BANDAGES/DRESSINGS) ×1 IMPLANT
DRSG AQUACEL AG ADV 3.5X10 (GAUZE/BANDAGES/DRESSINGS) ×2 IMPLANT
DRSG AQUACEL AG SP 3.5X10 (GAUZE/BANDAGES/DRESSINGS) ×2
DURAPREP 26ML APPLICATOR (WOUND CARE) ×2 IMPLANT
ELECT REM PT RETURN 15FT ADLT (MISCELLANEOUS) ×2 IMPLANT
ELIMINATOR HOLE APEX DEPUY (Hips) ×2 IMPLANT
GLOVE SURG ENC MOIS LTX SZ6 (GLOVE) ×4 IMPLANT
GLOVE SURG ENC MOIS LTX SZ7 (GLOVE) ×2 IMPLANT
GLOVE SURG POLY ORTHO LF SZ6.5 (GLOVE) ×2 IMPLANT
GLOVE SURG UNDER POLY LF SZ7.5 (GLOVE) ×2 IMPLANT
GOWN STRL REUS W/TWL LRG LVL3 (GOWN DISPOSABLE) ×4 IMPLANT
HEAD M SROM 36MM 2 (Hips) ×1 IMPLANT
HOLDER FOLEY CATH W/STRAP (MISCELLANEOUS) ×2 IMPLANT
KIT TURNOVER KIT A (KITS) ×2 IMPLANT
LINER NEUTRAL 36X58 PLUS4 ×2 IMPLANT
PACK ANTERIOR HIP CUSTOM (KITS) ×2 IMPLANT
PENCIL SMOKE EVACUATOR (MISCELLANEOUS) IMPLANT
PINNACLE SECTOR CUP 58MM (Hips) ×2 IMPLANT
SCREW 6.5MMX30MM (Screw) ×2 IMPLANT
SROM M HEAD 36MM 2 (Hips) ×2 IMPLANT
STEM FEM ACTIS HIGH SZ10 (Stem) ×2 IMPLANT
SUT MNCRL AB 4-0 PS2 18 (SUTURE) ×2 IMPLANT
SUT STRATAFIX 0 PDS 27 VIOLET (SUTURE) ×2
SUT VIC AB 1 CT1 36 (SUTURE) ×6 IMPLANT
SUT VIC AB 2-0 CT1 27 (SUTURE) ×4
SUT VIC AB 2-0 CT1 TAPERPNT 27 (SUTURE) ×2 IMPLANT
SUTURE STRATFX 0 PDS 27 VIOLET (SUTURE) ×1 IMPLANT
TRAY FOLEY MTR SLVR 16FR STAT (SET/KITS/TRAYS/PACK) IMPLANT
TUBE SUCTION HIGH CAP CLEAR NV (SUCTIONS) ×2 IMPLANT
WATER STERILE IRR 1000ML POUR (IV SOLUTION) ×2 IMPLANT

## 2021-01-30 NOTE — Discharge Instructions (Signed)

## 2021-01-30 NOTE — Transfer of Care (Signed)
Immediate Anesthesia Transfer of Care Note  Patient: Harry [name]  Procedure(s) Performed: TOTAL HIP ARTHROPLASTY ANTERIOR APPROACH (Right: Hip)  Patient Location: PACU  Anesthesia Type:MAC and Spinal  Level of Consciousness: awake, drowsy and patient cooperative  Airway & Oxygen Therapy: Patient Spontanous Breathing and Patient connected to face mask oxygen  Post-op Assessment: Report given to RN and Post -op Vital signs reviewed and stable  Post vital signs: Reviewed and stable  Last Vitals:  Vitals Value Taken Time  BP 97/68 01/30/21 0902  Temp    Pulse 74 01/30/21 0905  Resp 17 01/30/21 0905  SpO2 92 % 01/30/21 0905  Vitals shown include unvalidated device data.  Last Pain:  Vitals:   01/30/21 0615  TempSrc: Oral  PainSc:          Complications: No notable events documented.

## 2021-01-30 NOTE — Anesthesia Procedure Notes (Signed)
Spinal  Patient location during procedure: OR Start time: 01/30/2021 7:22 AM End time: 01/30/2021 7:32 AM Reason for block: surgical anesthesia Staffing Performed: anesthesiologist  Anesthesiologist: Duane Boston, MD Preanesthetic Checklist Completed: patient identified, IV checked, risks and benefits discussed, surgical consent, monitors and equipment checked, pre-op evaluation and timeout performed Spinal Block Patient position: sitting Prep: DuraPrep Patient monitoring: cardiac monitor, continuous pulse ox and blood pressure Approach: midline Location: L2-3 Injection technique: single-shot Needle Needle type: Pencan  Needle gauge: 24 G Needle length: 9 cm Assessment Events: CSF return Additional Notes Functioning IV was confirmed and monitors were applied. Sterile prep and drape, including hand hygiene and sterile gloves were used. The patient was positioned and the spine was prepped. The skin was anesthetized with lidocaine.  Free flow of clear CSF was obtained prior to injecting local anesthetic into the CSF.  The spinal needle aspirated freely following injection.  The needle was carefully withdrawn.  The patient tolerated the procedure well.

## 2021-01-30 NOTE — Anesthesia Procedure Notes (Signed)
Procedure Name: MAC Date/Time: 01/30/2021 7:27 AM Performed by: West Pugh, CRNA Pre-anesthesia Checklist: Patient identified, Emergency Drugs available, Suction available, Patient being monitored and Timeout performed Patient Re-evaluated:Patient Re-evaluated prior to induction Oxygen Delivery Method: Simple face mask Preoxygenation: Pre-oxygenation with 100% oxygen Placement Confirmation: positive ETCO2 Dental Injury: Teeth and Oropharynx as per pre-operative assessment

## 2021-01-30 NOTE — Evaluation (Signed)
Physical Therapy Evaluation Patient Details Name: Harry [name] MRN: [id number] DOB: June 17, 1940 Today's Date: 01/30/2021  History of Present Illness  Harry [name] is an 80 yo male s/p R THA AA. PMH: asthma  Clinical Impression  Pt is s/p R THA AA resulting in the deficits listed below (see PT Problem List). Pt independent at baseline with R Hip pain when ambulating, lives in 2 story home with 2 STE and has DME needs presents, spouse present and is also independent. Pt currently requiring supv for safety, verbal cues for positioning with RW for safety, using step to pattern for 100 ft with RW and min guard. Educated pt on HEP, provided written/illustrated instructions, and discussed mobilizing once home and all questions answered. Plan to progress ambulation and complete stair training tomorrow. Pt tolerates remaining up in chair with ice on RLE and chair alarm on. Pt will benefit from skilled PT to increase their independence and safety with mobility to allow discharge to the venue listed below.          Recommendations for follow up therapy are one component of a multi-disciplinary discharge planning process, led by the attending physician.  Recommendations may be updated based on patient status, additional functional criteria and insurance authorization.  Follow Up Recommendations Follow surgeon's recommendation for DC plan and follow-up therapies    Equipment Recommendations  None recommended by PT    Recommendations for Other Services       Precautions / Restrictions Precautions Precautions: Fall Restrictions Weight Bearing Restrictions: No      Mobility  Bed Mobility Overal bed mobility: Modified Independent  General bed mobility comments: increased time to come to sitting EOB, no physical assist or cues    Transfers Overall transfer level: Needs assistance Equipment used: Rolling walker (2 wheeled) Transfers: Sit to/from Stand Sit to Stand: Supervision  General  transfer comment: powers to stand from chair with UE assisting, equal bil foot placement  Ambulation/Gait Ambulation/Gait assistance: Min guard Gait Distance (Feet): 100 Feet Assistive device: Rolling walker (2 wheeled) Gait Pattern/deviations: Step-to pattern;Decreased stride length;Decreased weight shift to right Gait velocity: decreased   General Gait Details: step to pattern, no RLE buckling noted, VCs for closer positioning to RW  Stairs            Wheelchair Mobility    Modified Rankin (Stroke Patients Only)       Balance Overall balance assessment: Mild deficits observed, not formally tested       Pertinent Vitals/Pain Pain Assessment: Faces Faces Pain Scale: Hurts little more Pain Location: R thigh with mobility Pain Descriptors / Indicators: Aching;Sore Pain Intervention(s): Limited activity within patient's tolerance;Monitored during session;Repositioned;Ice applied    Home Living Family/patient expects to be discharged to:: Private residence Living Arrangements: Spouse/significant other;Children Available Help at Discharge: Family;Available 24 hours/day Type of Home: House Home Access: Stairs to enter   [employer] of Steps: 2 Home Layout: Two level;Bed/bath upstairs Home Equipment: Walker - 2 wheels;Bedside commode      Prior Function Level of Independence: Independent  Comments: Pt reports independent with ADLs/IADLs, community ambulation with pain in R hip.     Hand Dominance        Extremity/Trunk Assessment   Upper Extremity Assessment Upper Extremity Assessment: Overall WFL for tasks assessed    Lower Extremity Assessment Lower Extremity Assessment: RLE deficits/detail;LLE deficits/detail RLE Deficits / Details: AROM WNL, able to perform quad set, SLR, sitting and standing marching, LAQ with slight pain RLE Sensation: decreased light touch (over  thigh) RLE Coordination: WNL LLE Deficits / Details: AROM WNL, strength  grossly 4+/5, denies numbness/tingling LLE Sensation: WNL LLE Coordination: WNL    Cervical / Trunk Assessment Cervical / Trunk Assessment: Normal  Communication   Communication: No difficulties  Cognition Arousal/Alertness: Awake/alert Behavior During Therapy: WFL for tasks assessed/performed Overall Cognitive Status: Within Functional Limits for tasks assessed     General Comments      Exercises Total Joint Exercises Ankle Circles/Pumps: Seated;AROM;Both;20 reps Quad Sets: Seated;AROM;Strengthening;Right;10 reps   Assessment/Plan    PT Assessment Patient needs continued PT services  PT Problem List Decreased strength;Decreased activity tolerance;Decreased balance;Decreased knowledge of use of DME;Impaired sensation;Pain       PT Treatment Interventions DME instruction;Gait training;Stair training;Functional mobility training;Therapeutic activities;Therapeutic exercise;Balance training;Patient/family education    PT Goals (Current goals can be found in the Care Plan section)  Acute Rehab PT Goals Patient Stated Goal: "walk and see what I can do" PT Goal Formulation: With patient Time For Goal Achievement: 02/13/21 Potential to Achieve Goals: Good    Frequency 7X/week   Barriers to discharge        Co-evaluation               AM-PAC PT "6 Clicks" Mobility  Outcome Measure Help needed turning from your back to your side while in a flat bed without using bedrails?: A Little Help needed moving from lying on your back to sitting on the side of a flat bed without using bedrails?: A Little Help needed moving to and from a bed to a chair (including a wheelchair)?: A Little Help needed standing up from a chair using your arms (e.g., wheelchair or bedside chair)?: A Little Help needed to walk in hospital room?: A Little Help needed climbing 3-5 steps with a railing? : A Lot 6 Click Score: 17    End of Session Equipment Utilized During Treatment: Gait  belt Activity Tolerance: Patient tolerated treatment well Patient left: in chair;with call bell/phone within reach;with chair alarm set Nurse Communication: Mobility status PT Visit Diagnosis: Unsteadiness on feet (R26.81);Other abnormalities of gait and mobility (R26.89);Pain Pain - Right/Left: Right Pain - part of body: Hip    Time: 1353-1426 PT Time Calculation (min) (ACUTE ONLY): 33 min   Charges:   PT Evaluation $PT Eval Low Complexity: 1 Low PT Treatments $Gait Training: 8-22 mins         Tori Mashelle Busick PT, DPT 01/30/21, 2:32 PM

## 2021-01-30 NOTE — H&P (Signed)
TOTAL HIP ADMISSION H&P  Patient is admitted for right total hip arthroplasty.  Subjective:  Chief Complaint: right hip pain  HPI: Harry [name], 80 y.o. male, has a history of pain and functional disability in the right hip(s) due to arthritis and patient has failed non-surgical conservative treatments for greater than 12 weeks to include NSAID's and/or analgesics and activity modification.  Onset of symptoms was gradual starting 2 years ago with gradually worsening course since that time.The patient noted no past surgery on the right hip(s).  Patient currently rates pain in the right hip at 7 out of 10 with activity. Patient has worsening of pain with activity and weight bearing, pain that interfers with activities of daily living, and pain with passive range of motion. Patient has evidence of joint space narrowing by imaging studies. This condition presents safety issues increasing the risk of falls.  There is no current active infection.  Patient Active Problem List   Diagnosis Date Noted   Seasonal allergic rhinitis due to pollen 07/04/2020   Seasonal allergic conjunctivitis 07/04/2020   Solitary pulmonary nodule 04/22/2018   DOE (dyspnea on exertion) 11/01/2014   Seasonal and perennial allergic rhinitis 06/30/2010   HYPERLIPIDEMIA 03/05/2010   PERSONAL HISTORY OF ALLERGY TO OTHER FOODS 03/05/2010   Mild asthma with acute exacerbation 03/29/2007   Past Medical History:  Diagnosis Date   Allergic rhinitis    Allergy    Asthma    50 years ago   Giardia     Past Surgical History:  Procedure Laterality Date   abdominal aorta ultrasound  07/22/2009   mild atherosclerotic changes without aneurysm   COLONOSCOPY     HAND SURGERY  age 67   for infection   NASAL HEMORRHAGE CONTROL N/A 01/08/2017   Procedure: MINOR EPISTAXIS CONTROL;  Surgeon: Rozetta Nunnery, MD;  Location: [facility];  Service: ENT;  Laterality: N/A;   NASAL SINUS SURGERY  2006   NM MYOVIEW  LTD  03/19/2009   normal/ EF- 68%   TONSILLECTOMY     age 22    Current Facility-Administered Medications  Medication Dose Route Frequency Provider Last Rate Last Admin   ceFAZolin (ANCEF) IVPB 2g/100 mL premix  2 g Intravenous On Call to OR Irving Copas, PA-C       dexamethasone (DECADRON) injection 8 mg  8 mg Intravenous Once Irving Copas, PA-C       lactated ringers infusion   Intravenous Continuous Costella Hatcher R, PA-C       lactated ringers infusion   Intravenous Continuous Brennan Bailey, MD 10 mL/hr at 01/30/21 3790 Continued from Pre-op at 01/30/21 2409   tranexamic acid (CYKLOKAPRON) IVPB 1,000 mg  1,000 mg Intravenous To OR Irving Copas, PA-C       Allergies  Allergen Reactions   Apple    Bee Venom Other (See Comments)    unknown   Blackberry [Rubus Fruticosus]    Onion    Peach Flavor    Pear    Plum Pulp     Social History   Tobacco Use   Smoking status: Former    Types: Pipe   Smokeless tobacco: Never   Tobacco comments:    40 years ago  Substance Use Topics   Alcohol use: Yes    Comment: occasionally    Family History  Problem Relation Age of Onset   Aortic aneurysm Father 61   Lung cancer Mother 49   Allergic rhinitis Other  GM   Hypertension Brother    Pulmonary embolism Maternal Grandfather    Heart attack Neg Hx    Stroke Neg Hx    Colon cancer Neg Hx    Colon polyps Neg Hx    Esophageal cancer Neg Hx    Rectal cancer Neg Hx    Stomach cancer Neg Hx      Review of Systems  Constitutional:  Negative for chills and fever.  Respiratory:  Negative for cough and shortness of breath.   Cardiovascular:  Negative for chest pain.  Gastrointestinal:  Negative for nausea and vomiting.  Musculoskeletal:  Positive for arthralgias.   Objective:  Physical Exam Well nourished and well developed. General: Alert and oriented x3, cooperative and pleasant, no acute distress. Head: normocephalic, atraumatic, neck supple. Eyes:  EOMI.  Musculoskeletal: Right hip exam: Pain with hip flexion and limited internal rotation to 5 to 10 degrees with external rotation of 30 degrees. Pain is reproduced in the groin Left hip exam is otherwise normal without reproducible groin pain Neurovascular intact distally  Calves soft and nontender. Motor function intact in LE. Strength 5/5 LE bilaterally. Neuro: Distal pulses 2+. Sensation to light touch intact in LE.  Vital signs in last 24 hours: Temp:  [97.8 F (36.6 C)] 97.8 F (36.6 C) (10/13 0615) Pulse Rate:  [65] 65 (10/13 0615) Resp:  [18] 18 (10/13 0615) BP: (120)/(87) 120/87 (10/13 0615) SpO2:  [99 %] 99 % (10/13 0615) Weight:  [92.2 kg] 92.2 kg (10/13 0538)  Labs:   Estimated body mass index is 25.41 kg/m as calculated from the following:   Height as of this encounter: 6\' 3"  (1.905 m).   Weight as of this encounter: 92.2 kg.   Imaging Review Plain radiographs demonstrate severe degenerative joint disease of the right hip(s). The bone quality appears to be adequate for age and reported activity level.      Assessment/Plan:  End stage arthritis, right hip(s)  The patient history, physical examination, clinical judgement of the provider and imaging studies are consistent with end stage degenerative joint disease of the right hip(s) and total hip arthroplasty is deemed medically necessary. The treatment options including medical management, injection therapy, arthroscopy and arthroplasty were discussed at length. The risks and benefits of total hip arthroplasty were presented and reviewed. The risks due to aseptic loosening, infection, stiffness, dislocation/subluxation,  thromboembolic complications and other imponderables were discussed.  The patient acknowledged the explanation, agreed to proceed with the plan and consent was signed. Patient is being admitted for inpatient treatment for surgery, pain control, PT, OT, prophylactic antibiotics, VTE prophylaxis,  progressive ambulation and ADL's and discharge planning.The patient is planning to be discharged  home.  Therapy Plans: HEP Disposition: Home with wife Planned DVT Prophylaxis: aspirin 81mg  BID DME needed: none PCP: Dr. Crist Infante Cardiologist: Dr. Farris Has TXA: IV Allergies: NKDA Anesthesia Concerns: none BMI: 25.3 Last HgbA1c: Not diabetic  Other: - Prefers minimal narcotics - tramadol/norco, tylenol, celebrex - difficult catheterization, in & out at the end, possible flomax   Griffith Citron, PA-C Orthopedic Surgery [facility] [phone]

## 2021-01-30 NOTE — Op Note (Signed)
NAME:  Harry [name]                ACCOUNT NO.: [id number]      MEDICAL RECORD NO.: [id number]      FACILITY:  [facility]      PHYSICIAN:  Mauri Pole  DATE OF BIRTH:  05/10/1940     DATE OF PROCEDURE:  01/30/2021                                 OPERATIVE REPORT         PREOPERATIVE DIAGNOSIS: Right  hip osteoarthritis.      POSTOPERATIVE DIAGNOSIS:  Right hip osteoarthritis.      PROCEDURE:  Right total hip replacement through an anterior approach   utilizing DePuy THR system, component size 58 mm pinnacle cup, a size 36+4 neutral   Altrex liner, a size 10 Hi Actis stem with a 36-2 Articuleze metal head ball.      SURGEON:  Pietro Cassis. Alvan Dame, M.D.      ASSISTANT:  Costella Hatcher, PA-C     ANESTHESIA:  Spinal.      SPECIMENS:  None.      COMPLICATIONS:  None.      BLOOD LOSS:  300 cc     DRAINS:  None.      INDICATION OF THE PROCEDURE:  Harry [name] is a 80 y.o. male who had   presented to office for evaluation of right hip pain.  Radiographs revealed   progressive degenerative changes with bone-on-bone   articulation of the  hip joint, including subchondral cystic changes and osteophytes.  The patient had painful limited range of   motion significantly affecting their overall quality of life and function.  The patient was failing to    respond to conservative measures including medications and/or injections and activity modification and at this point was ready   to proceed with more definitive measures.  Consent was obtained for   benefit of pain relief.  Specific risks of infection, DVT, component   failure, dislocation, neurovascular injury, and need for revision surgery were reviewed in the office as well discussion of   the anterior versus posterior approach were reviewed.     PROCEDURE IN DETAIL:  The patient was brought to operative theater.   Once adequate anesthesia, preoperative antibiotics, 2 gm of Ancef, 1 gm of Tranexamic Acid,  and 10 mg of Decadron were administered, the patient was positioned supine on the [employer] table.  Once the patient was safely positioned with adequate padding of boney prominences we predraped out the hip, and used fluoroscopy to confirm orientation of the pelvis.      The right hip was then prepped and draped from proximal iliac crest to   mid thigh with a shower curtain technique.      Time-out was performed identifying the patient, planned procedure, and the appropriate extremity.     An incision was then made 2 cm lateral to the   anterior superior iliac spine extending over the orientation of the   tensor fascia lata muscle and sharp dissection was carried down to the   fascia of the muscle.      The fascia was then incised.  The muscle belly was identified and swept   laterally and retractor placed along the superior neck.  Following   cauterization of the circumflex vessels and removing some pericapsular  fat, a second cobra retractor was placed on the inferior neck.  A T-capsulotomy was made along the line of the   superior neck to the trochanteric fossa, then extended proximally and   distally.  Tag sutures were placed and the retractors were then placed   intracapsular.  We then identified the trochanteric fossa and   orientation of my neck cut and then made a neck osteotomy with the femur on traction.  The femoral   head was removed without difficulty or complication.  Traction was let   off and retractors were placed posterior and anterior around the   acetabulum.      The labrum and foveal tissue were debrided.  I began reaming with a 47 mm   reamer and reamed up to 57 mm reamer with good bony bed preparation and a 58 mm  cup was chosen.  The final 58 mm Pinnacle cup was then impacted under fluoroscopy to confirm the depth of penetration and orientation with respect to   Abduction and forward flexion.  A screw was placed into the ilium followed by the hole eliminator.  The  final   36+4 neutral Altrex liner was impacted with good visualized rim fit.  The cup was positioned anatomically within the acetabular portion of the pelvis.      At this point, the femur was rolled to 100 degrees.  Further capsule was   released off the inferior aspect of the femoral neck.  I then   released the superior capsule proximally.  With the leg in a neutral position the hook was placed laterally   along the femur under the vastus lateralis origin and elevated manually and then held in position using the hook attachment on the bed.  The leg was then extended and adducted with the leg rolled to 100   degrees of external rotation.  Retractors were placed along the medial calcar and posteriorly over the greater trochanter.  Once the proximal femur was fully   exposed, I used a box osteotome to set orientation.  I then began   broaching with the starting chili pepper broach and passed this by hand and then broached up to 10.  With the 10 broach in place I chose a high offset neck and did several trial reductions.  The offset was appropriate, leg lengths   appeared to be equal best matched with the -2 head ball trial confirmed radiographically.  He was noted to be short on this right hip pre-operatively likely related to not only arthritic changes but perhaps some preexisting shortening.  Based on these pre-op findings I restored length but attempted to not completely match the left hip.  His hip was stable without any signs of instability.  Given these findings, I went ahead and dislocated the hip, repositioned all   retractors and positioned the right hip in the extended and abducted position.  The final 10 Hi Actis stem was   chosen and it was impacted down to the level of neck cut.  Based on this   and the trial reductions, a final 36-02 Articuleze metal head ball was chosen and   impacted onto a clean and dry trunnion, and the hip was reduced.  The   hip had been irrigated throughout the  case again at this point.  I did   reapproximate the superior capsular leaflet to the anterior leaflet   using #1 Vicryl.  The fascia of the   tensor fascia lata muscle was then  reapproximated using #1 Vicryl and #0 Stratafix sutures.  The   remaining wound was closed with 2-0 Vicryl and running 4-0 Monocryl.   The hip was cleaned, dried, and dressed sterilely using Dermabond and   Aquacel dressing.  The patient was then brought   to recovery room in stable condition tolerating the procedure well.    Costella Hatcher, PA-C was present for the entirety of the case involved from   preoperative positioning, perioperative retractor management, general   facilitation of the case, as well as primary wound closure as assistant.            Pietro Cassis Alvan Dame, M.D.        01/30/2021 7:36 AM

## 2021-01-30 NOTE — Anesthesia Postprocedure Evaluation (Signed)
Anesthesia Post Note  Patient: Harry [name]  Procedure(s) Performed: TOTAL HIP ARTHROPLASTY ANTERIOR APPROACH (Right: Hip)     Patient location during evaluation: PACU Anesthesia Type: MAC Level of consciousness: awake and alert Pain management: pain level controlled Vital Signs Assessment: post-procedure vital signs reviewed and stable Respiratory status: spontaneous breathing and respiratory function stable Cardiovascular status: blood pressure returned to baseline and stable Postop Assessment: spinal receding Anesthetic complications: no   No notable events documented.  Last Vitals:  Vitals:   01/30/21 1045 01/30/21 1100  BP: 117/72 122/79  Pulse: 63 60  Resp: 17 16  Temp:  36.4 C  SpO2: 100% 100%    Last Pain:  Vitals:   01/30/21 1100  TempSrc: Oral  PainSc:                  [place]

## 2021-01-30 NOTE — Care Plan (Signed)
Ortho Bundle Case Management Note  Patient Details  Name: Harry [name] MRN: [id number] Date of Birth: Oct 23, 1940  R THA on 01-30-21 DCP:  Home with wife.  2 story home with 2 ste DME:  No needs.  Has a RW and 3-in-1 PT:  HEP                   DME Arranged:    DME Agency:     HH Arranged:    [place] Agency:     Additional Comments: Please contact me with any questions of if this plan should need to change.    01/30/2021, 9:06 AM

## 2021-01-31 ENCOUNTER — Encounter (HOSPITAL_COMMUNITY): Payer: Self-pay | Admitting: Orthopedic Surgery

## 2021-01-31 ENCOUNTER — Other Ambulatory Visit (HOSPITAL_COMMUNITY): Payer: Self-pay

## 2021-01-31 DIAGNOSIS — M1611 Unilateral primary osteoarthritis, right hip: Secondary | ICD-10-CM | POA: Diagnosis not present

## 2021-01-31 LAB — CBC
HCT: 38.5 % — ABNORMAL LOW (ref 39.0–52.0)
Hemoglobin: 12.6 g/dL — ABNORMAL LOW (ref 13.0–17.0)
MCH: 32.3 pg (ref 26.0–34.0)
MCHC: 32.7 g/dL (ref 30.0–36.0)
MCV: 98.7 fL (ref 80.0–100.0)
Platelets: 166 10*3/uL (ref 150–400)
RBC: 3.9 MIL/uL — ABNORMAL LOW (ref 4.22–5.81)
RDW: 12 % (ref 11.5–15.5)
WBC: 11.7 10*3/uL — ABNORMAL HIGH (ref 4.0–10.5)
nRBC: 0 % (ref 0.0–0.2)

## 2021-01-31 LAB — BASIC METABOLIC PANEL
Anion gap: 6 (ref 5–15)
BUN: 25 mg/dL — ABNORMAL HIGH (ref 8–23)
CO2: 27 mmol/L (ref 22–32)
Calcium: 8.3 mg/dL — ABNORMAL LOW (ref 8.9–10.3)
Chloride: 103 mmol/L (ref 98–111)
Creatinine, Ser: 0.76 mg/dL (ref 0.61–1.24)
GFR, Estimated: >60 mL/min (ref >60)
Glucose, Bld: 133 mg/dL — ABNORMAL HIGH (ref 70–99)
Potassium: 4.4 mmol/L (ref 3.5–5.1)
Sodium: 136 mmol/L (ref 135–145)

## 2021-01-31 MED ORDER — ASPIRIN 81 MG PO CHEW
81.0000 mg | CHEWABLE_TABLET | Freq: Two times a day (BID) | ORAL | 0 refills | Status: AC
  Filled 2021-01-31: qty 56, 28d supply, fill #0

## 2021-01-31 MED ORDER — POLYETHYLENE GLYCOL 3350 17 G PO PACK
17.0000 g | PACK | Freq: Every day | ORAL | 0 refills | Status: DC | PRN
  Filled 2021-01-31: qty 14, 14d supply, fill #0

## 2021-01-31 MED ORDER — DOCUSATE SODIUM 100 MG PO CAPS
100.0000 mg | ORAL_CAPSULE | Freq: Two times a day (BID) | ORAL | 0 refills | Status: DC
  Filled 2021-01-31: qty 10, 5d supply, fill #0

## 2021-01-31 MED ORDER — METHOCARBAMOL 500 MG PO TABS
500.0000 mg | ORAL_TABLET | Freq: Four times a day (QID) | ORAL | 0 refills | Status: DC | PRN
  Filled 2021-01-31: qty 40, 10d supply, fill #0

## 2021-01-31 MED ORDER — CELECOXIB 200 MG PO CAPS
200.0000 mg | ORAL_CAPSULE | Freq: Two times a day (BID) | ORAL | 0 refills | Status: DC
  Filled 2021-01-31: qty 60, 30d supply, fill #0

## 2021-01-31 MED ORDER — HYDROCODONE-ACETAMINOPHEN 5-325 MG PO TABS
1.0000 | ORAL_TABLET | Freq: Four times a day (QID) | ORAL | 0 refills | Status: DC | PRN
  Filled 2021-01-31: qty 42, 6d supply, fill #0

## 2021-01-31 NOTE — Progress Notes (Signed)
   Subjective: 1 Day Post-Op Procedure(s) (LRB): TOTAL HIP ARTHROPLASTY ANTERIOR APPROACH (Right) Patient reports pain as mild.   Patient seen in rounds for Dr. Alvan Dame. Patient is resting in bed on exam this morning. He is voiding without difficulty. He ambulated 100 feet with PT yesterday. He admits he was surprised to have as much thigh discomfort as he is having.  We will continue therapy today.   Objective: Vital signs in last 24 hours: Temp:  [97.6 F (36.4 C)-98.3 F (36.8 C)] 98.3 F (36.8 C) (10/14 0522) Pulse Rate:  [57-79] 67 (10/14 0522) Resp:  [13-19] 16 (10/14 0522) BP: (80-134)/(48-79) 112/59 (10/14 0522) SpO2:  [95 %-100 %] 97 % (10/14 0522)  Intake/Output from previous day:  Intake/Output Summary (Last 24 hours) at 01/31/2021 0809 Last data filed at 01/31/2021 0740 Gross per 24 hour  Intake 1971.97 ml  Output 2225 ml  Net -253.03 ml     Intake/Output this shift: Total I/O In: -  Out: 200 [Urine:200]  Labs: Recent Labs    01/28/21 1429 01/31/21 0314  HGB 14.8 12.6*   Recent Labs    01/28/21 1429 01/31/21 0314  WBC 5.9 11.7*  RBC 4.65 3.90*  HCT 45.1 38.5*  PLT 182 166   Recent Labs    01/28/21 1429 01/31/21 0314  NA 137 136  K 4.3 4.4  CL 104 103  CO2 26 27  BUN 24* 25*  CREATININE 0.73 0.76  GLUCOSE 85 133*  CALCIUM 9.1 8.3*   No results for input(s): LABPT, INR in the last 72 hours.  Exam: General - Patient is Alert and Oriented Extremity - Neurologically intact Sensation intact distally Intact pulses distally Dorsiflexion/Plantar flexion intact Dressing - dressing C/D/I Motor Function - intact, moving foot and toes well on exam.   Past Medical History:  Diagnosis Date   Allergic rhinitis    Allergy    Asthma    50 years ago   Giardia     Assessment/Plan: 1 Day Post-Op Procedure(s) (LRB): TOTAL HIP ARTHROPLASTY ANTERIOR APPROACH (Right) Active Problems:   S/P right total hip arthroplasty  Estimated body mass  index is 25.41 kg/m as calculated from the following:   Height as of this encounter: 6\' 3"  (1.905 m).   Weight as of this encounter: 92.2 kg. Advance diet Up with therapy D/C IV fluids  DVT Prophylaxis - Aspirin Weight bearing as tolerated.  Plan is to go Home after hospital stay. Plan to get up with PT today again. Plan to discharge home after 1-2 sessions of therapy today as long as he is meeting his goals. Follow up in the office in 2 weeks. All questions welcomed and addressed.   Griffith Citron, PA-C Orthopedic Surgery [phone] 01/31/2021, 8:09 AM

## 2021-01-31 NOTE — Progress Notes (Signed)
Physical Therapy Treatment Patient Details Name: Harry [name] MRN: [id number] DOB: Feb 23, 1941 Today's Date: 01/31/2021   History of Present Illness Dr Trevyon Swor is an 80 yo male s/p R THA AA. 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  General transfer comment: slightly impulsive but thats because he is Indep.  Goos use of hands to steady self. General Gait Details: 25% VC's on proper walker to self distance and safety with turns using walker correctly.  Also VC's for upright posture. Practiced stairs.  General stair comments: 25% VC's on proper sequencing and safety using both hands on one rail.  Then returned to room to perform some TE's following HEP handout.  Instructed on proper tech, freq as well as use of ICE.   Addressed all mobility questions, discussed appropriate activity, educated on use of ICE.  Pt ready for D/C to home.   Recommendations for follow up therapy are one component of a multi-disciplinary discharge planning process, led by the attending physician.  Recommendations may be updated based on patient status, additional functional criteria and insurance authorization.  Follow Up Recommendations  Follow surgeon's recommendation for DC plan and follow-up therapies     Equipment Recommendations  None recommended by PT    Recommendations for Other Services       Precautions / Restrictions Precautions Precautions: Fall     Mobility  Bed Mobility               General bed mobility comments: OOB in recliner    Transfers Overall transfer level: Needs assistance Equipment used: Rolling walker (2 wheeled) Transfers: Sit to/from [employer] Sit to Stand: Supervision Stand pivot transfers: Supervision       General transfer comment: slightly impulsive but thats because he is Indep.  Goos use of hands to  steady self.  Ambulation/Gait Ambulation/Gait assistance: Supervision Gait Distance (Feet): 125 Feet Assistive device: Rolling walker (2 wheeled) Gait Pattern/deviations: Step-through pattern;Trunk flexed Gait velocity: decreased   General Gait Details: 25% VC's on proper walker to self distance and safety with turns using walker correctly.  Also VC's for upright posture.   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)      Exercises  Total Hip Replacement TE's following HEP Handout 10 reps ankle pumps 05 reps knee presses 05 reps heel slides 05 reps SAQ's 05 reps ABD Instructed how to use a belt loop to assist  Followed by ICE  General Comments        Pertinent Vitals/Pain Pain Assessment: 0-10 Pain Score: 4  Pain Location: R thigh with mobility Pain Descriptors / Indicators: Aching;Sore;Discomfort;Operative site guarding Pain Intervention(s): Monitored during session;Repositioned;Ice applied;Premedicated before session    Home Living                      Prior Function            PT Goals (current goals can now be found in the care plan section) Progress towards PT goals: Progressing toward goals    Frequency    7X/week      PT Plan Current plan remains appropriate    Co-evaluation              AM-PAC PT "6 Clicks" Mobility    Outcome Measure  Help needed turning from your back to your side while in a flat bed without using bedrails?: A Little Help needed moving from lying on your back to sitting on the side of a flat bed without using bedrails?: A Little Help needed moving to and from a bed to a chair (including a wheelchair)?: A Little Help needed standing up from a chair using your arms (e.g., wheelchair or bedside chair)?: A Little Help needed to walk in hospital room?: A Little Help needed climbing 3-5 steps with a railing? : A Little 6 Click Score: 18    End of Session Equipment Utilized During Treatment: Gait belt Activity Tolerance: Patient tolerated treatment well Patient left: in chair;with call bell/phone within reach;with chair alarm set Nurse Communication: Mobility status (pt ready for D/C to home after one PT session) PT Visit Diagnosis: Unsteadiness on feet (R26.81);Other abnormalities of gait and mobility (R26.89);Pain Pain - Right/Left: Right Pain - part of body: Hip     Time: 5462-7035 PT Time Calculation (min) (ACUTE ONLY): 30 min  Charges:  $Gait Training: 8-22 mins $Therapeutic Exercise: 8-22 mins                     {Gimena Buick  PTA Acute  Rehabilitation Services Pager      [phone] Office      [phone]

## 2021-02-06 NOTE — Discharge Summary (Signed)
Physician Discharge Summary   Patient ID: Harry [name] MRN: [id number] DOB/AGE: 81-Feb-1942 81 y.o.  Admit date: 01/30/2021 Discharge date: 01/31/2021  Primary Diagnosis: Right  hip osteoarthritis.   Admission Diagnoses:  Past Medical History:  Diagnosis Date   Allergic rhinitis    Allergy    Asthma    50 years ago   Giardia    Discharge Diagnoses:   Active Problems:   S/P right total hip arthroplasty  Estimated body mass index is 25.41 kg/m as calculated from the following:   Height as of this encounter: 6\' 3"  (1.905 m).   Weight as of this encounter: 92.2 kg.  Procedure:  Procedure(s) (LRB): TOTAL HIP ARTHROPLASTY ANTERIOR APPROACH (Right)   Consults: None  HPI: Harry [name] is a 80 y.o. male who had   presented to office for evaluation of right hip pain.  Radiographs revealed   progressive degenerative changes with bone-on-bone   articulation of the  hip joint, including subchondral cystic changes and osteophytes.  The patient had painful limited range of   motion significantly affecting their overall quality of life and function.  The patient was failing to    respond to conservative measures including medications and/or injections and activity modification and at this point was ready   to proceed with more definitive measures.  Consent was obtained for benefit of pain relief.  Specific risks of infection, DVT, component   failure, dislocation, neurovascular injury, and need for revision surgery were reviewed in the office as well discussion of   the anterior versus posterior approach were reviewed.  Laboratory Data: Admission on 01/30/2021, Discharged on 01/31/2021  Component Date Value Ref Range Status   ABO/RH(D) 01/30/2021    Final                   Value:O POS Performed at [facility] [address] [place], [place] [address]    WBC 01/31/2021 11.7 (A)  4.0 - 10.5 K/uL Final   RBC 01/31/2021 3.90 (A)  4.22 - 5.81 MIL/uL Final    Hemoglobin 01/31/2021 12.6 (A)  13.0 - 17.0 g/dL Final   HCT 01/31/2021 38.5 (A)  39.0 - 52.0 % Final   MCV 01/31/2021 98.7  80.0 - 100.0 fL Final   MCH 01/31/2021 32.3  26.0 - 34.0 pg Final   MCHC 01/31/2021 32.7  30.0 - 36.0 g/dL Final   RDW 01/31/2021 12.0  11.5 - 15.5 % Final   Platelets 01/31/2021 166  150 - 400 K/uL Final   nRBC 01/31/2021 0.0  0.0 - 0.2 % Final   Performed at [facility], [place] [address] [place], [place] [address]   Sodium 01/31/2021 136  135 - 145 mmol/L Final   Potassium 01/31/2021 4.4  3.5 - 5.1 mmol/L Final   Chloride 01/31/2021 103  98 - 111 mmol/L Final   CO2 01/31/2021 27  22 - 32 mmol/L Final   Glucose, Bld 01/31/2021 133 (A)  70 - 99 mg/dL Final   Glucose reference range applies only to samples taken after fasting for at least 8 hours.   BUN 01/31/2021 25 (A)  8 - 23 mg/dL Final   Creatinine, Ser 01/31/2021 0.76  0.61 - 1.24 mg/dL Final   Calcium 01/31/2021 8.3 (A)  8.9 - 10.3 mg/dL Final   GFR, Estimated 01/31/2021 >60  >60 mL/min Final   Comment: (NOTE) Calculated using the CKD-EPI Creatinine Equation (2021)    Anion gap 01/31/2021 6  5 -  15 Final   Performed at [facility], [place] [address], [place], [place] [address]  Orders Only on 01/28/2021  Component Date Value Ref Range Status   SARS Coronavirus 2 01/28/2021 RESULT: NEGATIVE   Final   Comment: RESULT: NEGATIVESARS-CoV-2 INTERPRETATION:A NEGATIVE  test result means that SARS-CoV-2 RNA was not present in the specimen above the limit of detection of this test. This does not preclude a possible SARS-CoV-2 infection and should not be used as the  sole basis for patient management decisions. Negative results must be combined with clinical observations, patient history, and epidemiological information. Optimum specimen types and timing for peak viral levels during infections caused by SARS-CoV-2  have not been determined. Collection of multiple specimens or types  of specimens may be necessary to detect virus. Improper specimen collection and handling, sequence variability under primers/probes, or organism present below the limit of detection may  lead to false negative results. Positive and negative predictive values of testing are highly dependent on prevalence. False negative test results are more likely when prevalence of disease is high.The expected result is NEGATIVE.Fact S                          heet for  Healthcare Providers: LocalChronicle.no Sheet for Patients: SalonLookup.es Reference Range - Negative   Hospital Outpatient Visit on 01/28/2021  Component Date Value Ref Range Status   MRSA, PCR 01/28/2021 NEGATIVE  NEGATIVE Final   Staphylococcus aureus 01/28/2021 POSITIVE (A)  NEGATIVE Final   Comment: (NOTE) The Xpert SA Assay (FDA approved for NASAL specimens in patients 36 years of age and older), is one component of a comprehensive surveillance program. It is not intended to diagnose infection nor to guide or monitor treatment. Performed at [facility], [place] [address], [place], [place] [address]    WBC 01/28/2021 5.9  4.0 - 10.5 K/uL Final   RBC 01/28/2021 4.65  4.22 - 5.81 MIL/uL Final   Hemoglobin 01/28/2021 14.8  13.0 - 17.0 g/dL Final   HCT 01/28/2021 45.1  39.0 - 52.0 % Final   MCV 01/28/2021 97.0  80.0 - 100.0 fL Final   MCH 01/28/2021 31.8  26.0 - 34.0 pg Final   MCHC 01/28/2021 32.8  30.0 - 36.0 g/dL Final   RDW 01/28/2021 12.0  11.5 - 15.5 % Final   Platelets 01/28/2021 182  150 - 400 K/uL Final   nRBC 01/28/2021 0.0  0.0 - 0.2 % Final   Performed at [facility], [place] [address] [place], [place] [address]   Sodium 01/28/2021 137  135 - 145 mmol/L Final   Potassium 01/28/2021 4.3  3.5 - 5.1 mmol/L Final   Chloride 01/28/2021 104  98 - 111 mmol/L Final   CO2 01/28/2021 26  22 - 32 mmol/L Final   Glucose, Bld 01/28/2021 85   70 - 99 mg/dL Final   Glucose reference range applies only to samples taken after fasting for at least 8 hours.   BUN 01/28/2021 24 (A)  8 - 23 mg/dL Final   Creatinine, Ser 01/28/2021 0.73  0.61 - 1.24 mg/dL Final   Calcium 01/28/2021 9.1  8.9 - 10.3 mg/dL Final   Total Protein 01/28/2021 7.1  6.5 - 8.1 g/dL Final   Albumin 01/28/2021 4.2  3.5 - 5.0 g/dL Final   AST 01/28/2021 19  15 - 41 U/L Final   ALT 01/28/2021 20  0 - 44 U/L Final  Alkaline Phosphatase 01/28/2021 71  38 - 126 U/L Final   Total Bilirubin 01/28/2021 0.7  0.3 - 1.2 mg/dL Final   GFR, Estimated 01/28/2021 >60  >60 mL/min Final   Comment: (NOTE) Calculated using the CKD-EPI Creatinine Equation (2021)    Anion gap 01/28/2021 7  5 - 15 Final   Performed at [facility], [place] [address] [place], [place] [address]   ABO/RH(D) 01/28/2021 O POS   Final   Antibody Screen 01/28/2021 NEG   Final   Sample Expiration 01/28/2021 02/02/2021,2359   Final   Extend sample reason 01/28/2021    Final                   Value:NO TRANSFUSIONS OR PREGNANCY IN THE PAST 3 MONTHS Performed at [facility] [address] [place], [place] [address]      X-Rays:DG Pelvis Portable  Result Date: 01/30/2021 CLINICAL DATA:  Total hip arthroplasty EXAM: PORTABLE PELVIS 1-2 VIEWS COMPARISON:  None. FINDINGS: Total right hip arthroplasty that is well seated and located in the single projection. No acute fracture. IMPRESSION: Right hip arthroplasty without complicating features. Electronically Signed   By: Jorje Guild M.D.   On: 01/30/2021 09:46   DG C-Arm 1-60 Min-No Report  Result Date: 01/30/2021 CLINICAL DATA:  Status post right hip total arthroplasty EXAM: OPERATIVE RIGHT HIP (WITH PELVIS IF PERFORMED) 2 VIEWS TECHNIQUE: Fluoroscopic spot image(s) were submitted for interpretation post-operatively. COMPARISON:  None. FINDINGS: Intraoperative fluoroscopic images of the right hip demonstrate total hip  arthroplasty with satisfactory appearance of components and without obvious perihardware fracture. IMPRESSION: Intraoperative fluoroscopic images of the right hip demonstrate total hip arthroplasty with satisfactory appearance of components and without obvious perihardware fracture. Electronically Signed   By: Delanna Ahmadi M.D.   On: 01/30/2021 11:45   DG HIP OPERATIVE UNILAT W OR W/O PELVIS RIGHT  Result Date: 01/30/2021 CLINICAL DATA:  Status post right hip total arthroplasty EXAM: OPERATIVE RIGHT HIP (WITH PELVIS IF PERFORMED) 2 VIEWS TECHNIQUE: Fluoroscopic spot image(s) were submitted for interpretation post-operatively. COMPARISON:  None. FINDINGS: Intraoperative fluoroscopic images of the right hip demonstrate total hip arthroplasty with satisfactory appearance of components and without obvious perihardware fracture. IMPRESSION: Intraoperative fluoroscopic images of the right hip demonstrate total hip arthroplasty with satisfactory appearance of components and without obvious perihardware fracture. Electronically Signed   By: Delanna Ahmadi M.D.   On: 01/30/2021 11:45    EKG: Orders placed or performed during the hospital encounter of 01/28/21   EKG 12-Lead   EKG 12-Lead     Hospital Course: Harry [name] is a 80 y.o. who was admitted to [facility]. They were brought to the operating room on 01/30/2021 and underwent Procedure(s): [place].  Patient tolerated the procedure well and was later transferred to the recovery room and then to the orthopaedic floor for postoperative care. They were given PO and IV analgesics for pain control following their surgery. They were given 24 hours of postoperative antibiotics of  Anti-infectives (From admission, onward)    Start     Dose/Rate Route Frequency Ordered Stop   01/30/21 1400  ceFAZolin (ANCEF) IVPB 2g/100 mL premix        2 g 200 mL/hr over 30 Minutes Intravenous Every 6 hours 01/30/21 1107 01/30/21  2133   01/30/21 0600  ceFAZolin (ANCEF) IVPB 2g/100 mL premix        2 g 200 mL/hr over 30 Minutes Intravenous On  call to O.R. 01/30/21 5027 01/30/21 0801      and started on DVT prophylaxis in the form of Aspirin.   PT and OT were ordered for total joint protocol. Discharge planning consulted to help with postop disposition and equipment needs.  Patient had a good night on the evening of surgery. They started to get up OOB with therapy on POD #0. Pt was seen during rounds and was ready to go home pending progress with therapy. He worked with therapy on POD #1 and was meeting his goals. Pt was discharged to home later that day in stable condition.  Diet: Regular diet Activity: WBAT Follow-up: in 2 weeks Disposition: Home Discharged Condition: good   Discharge Instructions     Call MD / Call 911   Complete by: As directed    If you experience chest pain or shortness of breath, CALL 911 and be transported to the hospital emergency room.  If you develope a fever above 101 F, pus (white drainage) or increased drainage or redness at the wound, or calf pain, call your surgeon's office.   Change dressing   Complete by: As directed    Maintain surgical dressing until follow up in the clinic. If the edges start to pull up, may reinforce with tape. If the dressing is no longer working, may remove and cover with gauze and tape, but must keep the area dry and clean.  Call with any questions or concerns.   Constipation Prevention   Complete by: As directed    Drink plenty of fluids.  Prune juice may be helpful.  You may use a stool softener, such as Colace (over the counter) 100 mg twice a day.  Use MiraLax (over the counter) for constipation as needed.   Diet - low sodium heart healthy   Complete by: As directed    Increase activity slowly as tolerated   Complete by: As directed    Weight bearing as tolerated with assist device (walker, cane, etc) as directed, use it as long as suggested by your  surgeon or therapist, typically at least 4-6 weeks.   Post-operative opioid taper instructions:   Complete by: As directed    POST-OPERATIVE OPIOID TAPER INSTRUCTIONS: It is important to wean off of your opioid medication as soon as possible. If you do not need pain medication after your surgery it is ok to stop day one. Opioids include: Codeine, Hydrocodone(Norco, Vicodin), Oxycodone(Percocet, oxycontin) and hydromorphone amongst others.  Long term and even short term use of opiods can cause: Increased pain response Dependence Constipation Depression Respiratory depression And more.  Withdrawal symptoms can include Flu like symptoms Nausea, vomiting And more Techniques to manage these symptoms Hydrate well Eat regular healthy meals Stay active Use relaxation techniques(deep breathing, meditating, yoga) Do Not substitute Alcohol to help with tapering If you have been on opioids for less than two weeks and do not have pain than it is ok to stop all together.  Plan to wean off of opioids This plan should start within one week post op of your joint replacement. Maintain the same interval or time between taking each dose and first decrease the dose.  Cut the total daily intake of opioids by one tablet each day Next start to increase the time between doses. The last dose that should be eliminated is the evening dose.      TED hose   Complete by: As directed    Use stockings (TED hose) for 2 weeks on both leg(s).  You may remove them at night for sleeping.      Allergies as of 01/31/2021       Reactions   Apple    Bee Venom Other (See Comments)   unknown   Blackberry [rubus Fruticosus]    Onion    Peach Flavor    Pear    Plum Pulp         Medication List     STOP taking these medications    diclofenac 75 MG EC tablet Commonly known as: VOLTAREN       TAKE these medications    acetaminophen 500 MG tablet Commonly known as: TYLENOL Take 500 mg by mouth  every 6 (six) hours as needed for moderate pain.   albuterol 108 (90 Base) MCG/ACT inhaler Commonly known as: VENTOLIN HFA Inhale 2 puffs into the lungs every 4 (four) hours as needed for wheeze, cough, or shortness of breath.   aspirin 81 MG chewable tablet Chew 1 tablet (81 mg total) by mouth 2 (two) times daily for 28 days.   azithromycin 500 MG tablet Commonly known as: ZITHROMAX TAKE 1 TABLET BY MOUTH ONCE DAILY FOR 3 DAYS.   azithromycin 500 MG tablet Commonly known as: ZITHROMAX Take 1 tablet (500 mg total) by mouth daily.   celecoxib 200 MG capsule Commonly known as: CELEBREX Take 1 capsule (200 mg total) by mouth 2 (two) times daily.   docusate sodium 100 MG capsule Commonly known as: COLACE Take 1 capsule (100 mg total) by mouth 2 (two) times daily.   EPINEPHrine 0.3 mg/0.3 mL Soaj injection Commonly known as: EPI-PEN INJECT 0.3 MG INTO THE MUSCLE AS NEEDED FOR ANAPHYLAXIS. USE AS DIRECTED FOR LIFE-THREATENING ALLERGIC REACTION.   Flovent HFA 110 MCG/ACT inhaler Generic drug: fluticasone Inhale 2 puffs into the lungs 2 (two) times daily and rinse with water after use   HYDROcodone-acetaminophen 5-325 MG tablet Commonly known as: NORCO/VICODIN Take 1-2 tablets by mouth every 6 (six) hours as needed for severe pain (pain score 7-10).   methocarbamol 500 MG tablet Commonly known as: ROBAXIN Take 1 tablet (500 mg total) by mouth every 6 (six) hours as needed for muscle spasms.   NONFORMULARY OR COMPOUNDED ITEM Allergy Vaccine 1:10 Given at [facility]   polyethylene glycol 17 g packet Commonly known as: MIRALAX / GLYCOLAX Take 17 g by mouth daily as needed for mild constipation.   rosuvastatin 10 MG tablet Commonly known as: CRESTOR Take 1 tablet (10 mg total) by mouth daily.               Discharge Care Instructions  (From admission, onward)           Start     Ordered   01/31/21 0000  Change dressing       Comments: Maintain  surgical dressing until follow up in the clinic. If the edges start to pull up, may reinforce with tape. If the dressing is no longer working, may remove and cover with gauze and tape, but must keep the area dry and clean.  Call with any questions or concerns.   01/31/21 0818            Follow-up Information     Paralee Cancel, MD. Go in 2 week(s).   Specialty: Orthopedic Surgery Why: You are scheduled for a follow up appointment on 02-13-21 at 3:30 pm. Contact information: [address] [place] [address] [phone]  Signed: Griffith Citron, PA-C Orthopedic Surgery 02/06/2021, 3:11 PM

## 2021-03-03 ENCOUNTER — Ambulatory Visit (INDEPENDENT_AMBULATORY_CARE_PROVIDER_SITE_OTHER): Payer: Medicare Other

## 2021-03-03 DIAGNOSIS — J309 Allergic rhinitis, unspecified: Secondary | ICD-10-CM | POA: Diagnosis not present

## 2021-03-07 ENCOUNTER — Other Ambulatory Visit (HOSPITAL_COMMUNITY): Payer: Self-pay

## 2021-03-07 MED ORDER — AMOXICILLIN 500 MG PO CAPS
2000.0000 mg | ORAL_CAPSULE | ORAL | 2 refills | Status: DC
  Filled 2021-03-07: qty 20, 5d supply, fill #0

## 2021-04-15 ENCOUNTER — Other Ambulatory Visit (HOSPITAL_COMMUNITY): Payer: Self-pay

## 2021-04-15 MED ORDER — PREDNISONE 10 MG PO TABS
10.0000 mg | ORAL_TABLET | Freq: Two times a day (BID) | ORAL | 1 refills | Status: DC
  Filled 2021-04-15: qty 40, 20d supply, fill #0

## 2021-04-22 ENCOUNTER — Ambulatory Visit (INDEPENDENT_AMBULATORY_CARE_PROVIDER_SITE_OTHER): Payer: Medicare Other | Admitting: *Deleted

## 2021-04-22 DIAGNOSIS — J309 Allergic rhinitis, unspecified: Secondary | ICD-10-CM | POA: Diagnosis not present

## 2021-04-28 ENCOUNTER — Ambulatory Visit (INDEPENDENT_AMBULATORY_CARE_PROVIDER_SITE_OTHER): Payer: Medicare Other | Admitting: *Deleted

## 2021-04-28 DIAGNOSIS — J309 Allergic rhinitis, unspecified: Secondary | ICD-10-CM | POA: Diagnosis not present

## 2021-04-28 NOTE — Progress Notes (Signed)
VIALS MADE. EXP 04-28-22 °

## 2021-04-29 DIAGNOSIS — J301 Allergic rhinitis due to pollen: Secondary | ICD-10-CM | POA: Diagnosis not present

## 2021-04-30 DIAGNOSIS — J3089 Other allergic rhinitis: Secondary | ICD-10-CM | POA: Diagnosis not present

## 2021-05-26 ENCOUNTER — Ambulatory Visit (INDEPENDENT_AMBULATORY_CARE_PROVIDER_SITE_OTHER): Payer: Medicare Other

## 2021-05-26 DIAGNOSIS — J309 Allergic rhinitis, unspecified: Secondary | ICD-10-CM | POA: Diagnosis not present

## 2021-06-04 ENCOUNTER — Other Ambulatory Visit (HOSPITAL_COMMUNITY): Payer: Self-pay

## 2021-06-04 MED ORDER — CEFUROXIME AXETIL 500 MG PO TABS
500.0000 mg | ORAL_TABLET | Freq: Two times a day (BID) | ORAL | 1 refills | Status: DC
  Filled 2021-06-04: qty 30, 15d supply, fill #0

## 2021-06-19 ENCOUNTER — Ambulatory Visit (INDEPENDENT_AMBULATORY_CARE_PROVIDER_SITE_OTHER): Payer: Medicare Other | Admitting: *Deleted

## 2021-06-19 DIAGNOSIS — J309 Allergic rhinitis, unspecified: Secondary | ICD-10-CM | POA: Diagnosis not present

## 2021-07-17 ENCOUNTER — Ambulatory Visit (INDEPENDENT_AMBULATORY_CARE_PROVIDER_SITE_OTHER): Payer: Medicare Other

## 2021-07-17 DIAGNOSIS — J309 Allergic rhinitis, unspecified: Secondary | ICD-10-CM

## 2021-07-21 ENCOUNTER — Ambulatory Visit (INDEPENDENT_AMBULATORY_CARE_PROVIDER_SITE_OTHER): Payer: Medicare Other

## 2021-07-21 DIAGNOSIS — J309 Allergic rhinitis, unspecified: Secondary | ICD-10-CM | POA: Diagnosis not present

## 2021-07-22 ENCOUNTER — Other Ambulatory Visit (HOSPITAL_COMMUNITY): Payer: Self-pay

## 2021-07-22 MED ORDER — ZYLET 0.5-0.3 % OP SUSP
1.0000 [drp] | Freq: Four times a day (QID) | OPHTHALMIC | 0 refills | Status: DC
  Filled 2021-07-22: qty 10, 50d supply, fill #0

## 2021-07-23 ENCOUNTER — Other Ambulatory Visit (HOSPITAL_COMMUNITY): Payer: Self-pay

## 2021-07-23 MED ORDER — PREDNISONE 10 MG PO TABS
ORAL_TABLET | ORAL | 2 refills | Status: AC
  Filled 2021-07-23: qty 21, 12d supply, fill #0

## 2021-07-29 ENCOUNTER — Ambulatory Visit (INDEPENDENT_AMBULATORY_CARE_PROVIDER_SITE_OTHER): Payer: Medicare Other | Admitting: *Deleted

## 2021-07-29 DIAGNOSIS — J309 Allergic rhinitis, unspecified: Secondary | ICD-10-CM

## 2021-08-06 ENCOUNTER — Other Ambulatory Visit (HOSPITAL_COMMUNITY): Payer: Self-pay

## 2021-08-06 MED ORDER — DOXYCYCLINE HYCLATE 100 MG PO CAPS
100.0000 mg | ORAL_CAPSULE | Freq: Two times a day (BID) | ORAL | 0 refills | Status: DC
  Filled 2021-08-06: qty 10, 5d supply, fill #0

## 2021-09-02 ENCOUNTER — Ambulatory Visit (INDEPENDENT_AMBULATORY_CARE_PROVIDER_SITE_OTHER): Payer: Medicare Other

## 2021-09-02 DIAGNOSIS — J309 Allergic rhinitis, unspecified: Secondary | ICD-10-CM

## 2021-09-09 ENCOUNTER — Other Ambulatory Visit (HOSPITAL_COMMUNITY): Payer: Self-pay

## 2021-09-09 MED ORDER — MYRBETRIQ 25 MG PO TB24
25.0000 mg | ORAL_TABLET | Freq: Every day | ORAL | 3 refills | Status: DC
  Filled 2021-09-09: qty 30, 30d supply, fill #0
  Filled 2021-09-09: qty 90, 90d supply, fill #0

## 2021-09-09 MED ORDER — FESOTERODINE FUMARATE ER 4 MG PO TB24
4.0000 mg | ORAL_TABLET | Freq: Every day | ORAL | 11 refills | Status: DC
  Filled 2021-09-09: qty 30, 30d supply, fill #0

## 2021-09-10 ENCOUNTER — Other Ambulatory Visit (HOSPITAL_COMMUNITY): Payer: Self-pay

## 2021-09-16 ENCOUNTER — Other Ambulatory Visit: Payer: Self-pay

## 2021-09-16 ENCOUNTER — Telehealth: Payer: Self-pay | Admitting: Gastroenterology

## 2021-09-16 DIAGNOSIS — R197 Diarrhea, unspecified: Secondary | ICD-10-CM

## 2021-09-16 NOTE — Telephone Encounter (Signed)
Left message for patient to call back. GI pathogen panel ordered.

## 2021-09-16 NOTE — Telephone Encounter (Signed)
Patient called stating he has been on heavy doses of antibiotics due to dental work and having a total hip replacement.  He has been alternating between diarrhea and constipation and would like to have a stool culture ordered.  He would also like to see Dr. Fuller Plan sooner rather than later.  His first appointment is 7/11.  He was also offered an appt with one of the APPS, but he wants to only see Dr. Fuller Plan.  Please call patient and advise.  Thank you.

## 2021-09-17 NOTE — Telephone Encounter (Signed)
Left message for patient to call back  

## 2021-09-17 NOTE — Telephone Encounter (Signed)
Patient returned your call, please advise. 

## 2021-09-18 NOTE — Telephone Encounter (Signed)
Unable to reach patient x 3. Called wife's number & went to voicemail.

## 2021-09-22 ENCOUNTER — Ambulatory Visit (INDEPENDENT_AMBULATORY_CARE_PROVIDER_SITE_OTHER): Payer: Medicare Other

## 2021-09-22 DIAGNOSIS — J309 Allergic rhinitis, unspecified: Secondary | ICD-10-CM | POA: Diagnosis not present

## 2021-09-26 ENCOUNTER — Other Ambulatory Visit (HOSPITAL_COMMUNITY): Payer: Self-pay

## 2021-09-26 MED ORDER — PREDNISONE 10 MG PO TABS
ORAL_TABLET | ORAL | 0 refills | Status: DC
  Filled 2021-09-26: qty 21, 12d supply, fill #0

## 2021-09-29 ENCOUNTER — Other Ambulatory Visit (HOSPITAL_COMMUNITY): Payer: Self-pay

## 2021-10-28 ENCOUNTER — Ambulatory Visit: Payer: Medicare Other | Admitting: Gastroenterology

## 2022-01-13 ENCOUNTER — Ambulatory Visit (INDEPENDENT_AMBULATORY_CARE_PROVIDER_SITE_OTHER): Payer: Medicare Other | Admitting: *Deleted

## 2022-01-13 DIAGNOSIS — J309 Allergic rhinitis, unspecified: Secondary | ICD-10-CM

## 2022-01-30 ENCOUNTER — Other Ambulatory Visit (HOSPITAL_COMMUNITY): Payer: Self-pay

## 2022-01-30 MED ORDER — BETAMETHASONE DIPROPIONATE AUG 0.05 % EX OINT
1.0000 | TOPICAL_OINTMENT | Freq: Two times a day (BID) | CUTANEOUS | 0 refills | Status: DC
  Filled 2022-01-30: qty 50, 14d supply, fill #0

## 2022-03-24 ENCOUNTER — Other Ambulatory Visit: Payer: Self-pay

## 2022-03-24 ENCOUNTER — Other Ambulatory Visit (HOSPITAL_COMMUNITY): Payer: Self-pay

## 2022-03-24 ENCOUNTER — Encounter: Payer: Self-pay | Admitting: Allergy and Immunology

## 2022-03-24 ENCOUNTER — Ambulatory Visit (INDEPENDENT_AMBULATORY_CARE_PROVIDER_SITE_OTHER): Payer: Medicare Other | Admitting: Allergy and Immunology

## 2022-03-24 VITALS — BP 132/78 | HR 59 | Temp 97.7°F | Resp 18 | Ht 75.0 in | Wt 207.6 lb

## 2022-03-24 DIAGNOSIS — J0140 Acute pansinusitis, unspecified: Secondary | ICD-10-CM

## 2022-03-24 DIAGNOSIS — J3089 Other allergic rhinitis: Secondary | ICD-10-CM | POA: Diagnosis not present

## 2022-03-24 DIAGNOSIS — T781XXD Other adverse food reactions, not elsewhere classified, subsequent encounter: Secondary | ICD-10-CM

## 2022-03-24 DIAGNOSIS — J302 Other seasonal allergic rhinitis: Secondary | ICD-10-CM

## 2022-03-24 DIAGNOSIS — R051 Acute cough: Secondary | ICD-10-CM | POA: Diagnosis not present

## 2022-03-24 MED ORDER — METHYLPREDNISOLONE ACETATE 80 MG/ML IJ SUSP
80.0000 mg | Freq: Once | INTRAMUSCULAR | Status: AC
  Administered 2022-03-24: 80 mg via INTRAMUSCULAR

## 2022-03-24 MED ORDER — AZITHROMYCIN 500 MG PO TABS
500.0000 mg | ORAL_TABLET | Freq: Every day | ORAL | 0 refills | Status: DC
  Filled 2022-03-24: qty 3, 3d supply, fill #0

## 2022-03-24 NOTE — Patient Instructions (Addendum)
  1.  Continue immunotherapy   2.  Continue injectable epinephrine device if required  3.  Continue OTC antihistamine and Pataday if needed  4. For this recent event:   A. Depomedrol 80 IM delivered in clinic today  B. Azithromycin 500 mg - 1 tablet 1 time a day for 3 days  5. Consider RSV vaccine  6. Further treatment???

## 2022-03-24 NOTE — Progress Notes (Unsigned)
[place] [facility] [place] [facility] [place]   Follow-up Note  Referring Provider: Crist Infante, MD Primary Provider: Crist Infante, MD Date of Office Visit: 03/24/2022  Subjective:   Harry [name] (DOB: [birth year]) is a 81 y.o. male who returns to the [facility] on 03/24/2022 in re-evaluation of the following:  HPI: Jm this clinic in evaluation of a recent event that developed while located in [place].  We see him in this clinic for allergic rhinoconjunctivitis and history of oral allergy syndrome.  Approximately 5 weeks ago while visiting [place] he had to spend significant time in the hospital as his wife broke her hip and wrist and around that point in time he started to develop a cough.  He has noticed some mucous plugs.  He had some somewhat ugly drainage in the back of his throat.  He had a little bit of nasal stuffiness and some throat clearing.  He has not had any anosmia, decreased ability to taste, fever, associated systemic or constitutional symptoms.  He has received the flu vaccine and the COVID-vaccine.  He continues on immunotherapy without adverse effect.  Allergies as of 03/24/2022       Reactions   Apple Juice    Bee Venom Other (See Comments)   unknown   Blackberry [rubus Fruticosus]    Onion    Peach Flavor    Pear    Plum Pulp         Medication List        Accurate as of March 24, 2022  3:40 PM. If you have any questions, ask your nurse or doctor.          STOP taking these medications    celecoxib 200 MG capsule Commonly known as: CELEBREX Stopped by: Deretha Ertle Kevan Rosebush, MD       TAKE these medications    acetaminophen 500 MG tablet Commonly known as: TYLENOL Take 500 mg by mouth every 6 (six) hours as needed for moderate pain.   albuterol 108 (90 Base) MCG/ACT inhaler Commonly known as: VENTOLIN HFA Inhale 2 puffs into the lungs every 4 (four) hours as needed for wheeze, cough, or shortness of breath.    amoxicillin 500 MG capsule Commonly known as: AMOXIL Take 4 capsules (2,000 mg total) by mouth 1 hour before dental work   augmented betamethasone dipropionate 0.05 % ointment Commonly known as: DIPROLENE-AF Apply 1 application topically 2 (two) times daily to the affected area for 2 weeks as directed   azithromycin 500 MG tablet Commonly known as: ZITHROMAX Take 1 tablet (500 mg total) by mouth daily.   cefUROXime 500 MG tablet Commonly known as: CEFTIN Take 1 tablet (500 mg total) by mouth 2 (two) times daily for 15 days   docusate sodium 100 MG capsule Commonly known as: COLACE Take 1 capsule (100 mg total) by mouth 2 (two) times daily.   doxycycline 100 MG capsule Commonly known as: VIBRAMYCIN Take 1 capsule (100 mg total) by mouth 2 (two) times daily.   fesoterodine 4 MG Tb24 tablet Commonly known as: Toviaz Take 1 tablet (4 mg total) by mouth daily.   Flovent HFA 110 MCG/ACT inhaler Generic drug: fluticasone Inhale 2 puffs into the lungs 2 (two) times daily and rinse with water after use   HYDROcodone-acetaminophen 5-325 MG tablet Commonly known as: NORCO/VICODIN Take 1-2 tablets by mouth every 6 (six) hours as needed for severe pain (pain score 7-10).   methocarbamol 500 MG  tablet Commonly known as: ROBAXIN Take 1 tablet (500 mg total) by mouth every 6 (six) hours as needed for muscle spasms.   Myrbetriq 25 MG Tb24 tablet Generic drug: mirabegron ER Take 1 tablet (25 mg total) by mouth daily.   NONFORMULARY OR COMPOUNDED ITEM Allergy Vaccine 1:10 Given at [facility]   polyethylene glycol 17 g packet Commonly known as: MIRALAX / GLYCOLAX Take 17 g by mouth daily as needed for mild constipation.   predniSONE 10 MG tablet Commonly known as: DELTASONE Take 1 tablet (10 mg total) by mouth 2 (two) times daily.   rosuvastatin 10 MG tablet Commonly known as: CRESTOR Take 1 tablet (10 mg total) by mouth daily.   Zylet 0.5-0.3 % Susp Generic drug:  Loteprednol-Tobramycin Place 1 drop into the left eye 4 (four) times daily.        Past Medical History:  Diagnosis Date   Allergic rhinitis    Allergy    Asthma    50 years ago   Giardia     Past Surgical History:  Procedure Laterality Date   abdominal aorta ultrasound  07/22/2009   mild atherosclerotic changes without aneurysm   COLONOSCOPY     HAND SURGERY  age 23   for infection   NASAL HEMORRHAGE CONTROL N/A 01/08/2017   Procedure: MINOR EPISTAXIS CONTROL;  Surgeon: Rozetta Nunnery, MD;  Location: [facility];  Service: ENT;  Laterality: N/A;   NASAL SINUS SURGERY  2006   NM MYOVIEW LTD  03/19/2009   normal/ EF- 68%   TONSILLECTOMY     age 77   TOTAL HIP ARTHROPLASTY Right 01/30/2021   Procedure: TOTAL HIP ARTHROPLASTY ANTERIOR APPROACH;  Surgeon: Paralee Cancel, MD;  Location: [facility];  Service: Orthopedics;  Laterality: Right;    Review of systems negative except as noted in HPI / PMHx or noted below:  Review of Systems  Constitutional: Negative.   HENT: Negative.    Eyes: Negative.   Respiratory: Negative.    Cardiovascular: Negative.   Gastrointestinal: Negative.   Genitourinary: Negative.   Musculoskeletal: Negative.   Skin: Negative.   Neurological: Negative.   Endo/Heme/Allergies: Negative.   Psychiatric/Behavioral: Negative.       Objective:   Vitals:   03/24/22 1522  BP: 132/78  Pulse: (!) 59  Resp: 18  Temp: 97.7 F (36.5 C)  SpO2: 97%   Height: '6\' 3"'$  (190.5 cm)  Weight: 207 lb 9.6 oz (94.2 kg)   Physical Exam Constitutional:      Appearance: He is not diaphoretic.  HENT:     Head: Normocephalic.     Right Ear: Tympanic membrane, ear canal and external ear normal.     Left Ear: Tympanic membrane, ear canal and external ear normal.     Nose: Nose normal. No mucosal edema or rhinorrhea.     Mouth/Throat:     Pharynx: Uvula midline. No oropharyngeal exudate.  Eyes:     Conjunctiva/sclera: Conjunctivae normal.   Neck:     Thyroid: No thyromegaly.     Trachea: Trachea normal. No tracheal tenderness or tracheal deviation.  Cardiovascular:     Rate and Rhythm: Normal rate and regular rhythm.     Heart sounds: Normal heart sounds, S1 normal and S2 normal. No murmur heard. Pulmonary:     Effort: No respiratory distress.     Breath sounds: Normal breath sounds. No stridor. No wheezing or rales.  Lymphadenopathy:     Head:     Right  side of head: No tonsillar adenopathy.     Left side of head: No tonsillar adenopathy.     Cervical: No cervical adenopathy.  Skin:    Findings: No erythema or rash.     Nails: There is no clubbing.  Neurological:     Mental Status: He is alert.     Diagnostics: none  Assessment and Plan:   1. Acute cough   2. Acute pansinusitis, recurrence not specified   3. Seasonal and perennial allergic rhinitis   4. Pollen-food allergy, subsequent encounter    1.  Continue immunotherapy   2.  Continue injectable epinephrine device if required  3.  Continue OTC antihistamine and Pataday if needed  4. For this recent event:   A. Depomedrol 80 IM delivered in clinic today  B. Azithromycin 500 mg - 1 tablet 1 time a day for 3 days  5. Consider RSV vaccine  6. Further treatment???  I am going to treat him with an injection of systemic steroids and a broad-spectrum antibiotic for a 5-week history of cough and mucus production involving both his upper and lower airways that appears to have been an acute event without resolution.  Assuming he does well with this plan he will continue to use immunotherapy to address his atopic disease.  I will see him back in this clinic pending his response.  Allena Katz, MD Allergy / Immunology [place]

## 2022-03-25 ENCOUNTER — Encounter: Payer: Self-pay | Admitting: Allergy and Immunology

## 2022-03-25 DIAGNOSIS — J3081 Allergic rhinitis due to animal (cat) (dog) hair and dander: Secondary | ICD-10-CM | POA: Diagnosis not present

## 2022-03-25 NOTE — Progress Notes (Signed)
VIALS EXP 03-26-23 

## 2022-03-26 DIAGNOSIS — J3089 Other allergic rhinitis: Secondary | ICD-10-CM | POA: Diagnosis not present

## 2022-03-30 ENCOUNTER — Telehealth: Payer: Self-pay | Admitting: *Deleted

## 2022-03-30 ENCOUNTER — Ambulatory Visit
Admission: RE | Admit: 2022-03-30 | Discharge: 2022-03-30 | Disposition: A | Discharge status: Home / Self Care | Payer: Medicare Other | Source: Ambulatory Visit | Attending: Allergy and Immunology | Admitting: Allergy and Immunology

## 2022-03-30 ENCOUNTER — Telehealth: Payer: Self-pay

## 2022-03-30 DIAGNOSIS — R059 Cough, unspecified: Secondary | ICD-10-CM

## 2022-03-30 NOTE — Telephone Encounter (Signed)
Chest X ray ordered per Dr. Neldon Mc.

## 2022-03-30 NOTE — Telephone Encounter (Signed)
Patient called stating that he would like to speak to Dr. Neldon Mc and when like an appointment tomorrow if there is one available. Patient states that he is not any better since his last visit and that he would like Dr. Neldon Mc to order him a chest X-ray. Reasoning given, his mother had cancer of the lungs and he wants to make sure that he is not going to have the same problem and wants to get on top of this before it gets worse. He would like Dr. Neldon Mc to give him a call back at [phone].

## 2022-03-30 NOTE — Telephone Encounter (Signed)
Called and spoke with patient and verified which location for the chest x ray. Patient confirmed [facility] [place] in [place]. Chest x ray has been ordered as stat for patient. Patient verbalized understanding.

## 2022-03-30 NOTE — Telephone Encounter (Signed)
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

## 2022-03-31 ENCOUNTER — Ambulatory Visit (INDEPENDENT_AMBULATORY_CARE_PROVIDER_SITE_OTHER): Payer: Medicare Other | Admitting: Allergy and Immunology

## 2022-03-31 ENCOUNTER — Other Ambulatory Visit (HOSPITAL_COMMUNITY): Payer: Self-pay

## 2022-03-31 VITALS — BP 120/62 | HR 91 | Temp 97.6°F | Resp 18 | Ht 75.0 in | Wt 204.3 lb

## 2022-03-31 DIAGNOSIS — J309 Allergic rhinitis, unspecified: Secondary | ICD-10-CM | POA: Diagnosis not present

## 2022-03-31 MED ORDER — EPINEPHRINE 0.3 MG/0.3ML IJ SOAJ
0.3000 mg | INTRAMUSCULAR | 1 refills | Status: DC | PRN
  Filled 2022-03-31: qty 2, 2d supply, fill #0

## 2022-03-31 MED ORDER — AIRSUPRA 90-80 MCG/ACT IN AERO
2.0000 | INHALATION_SPRAY | RESPIRATORY_TRACT | 1 refills | Status: DC | PRN
  Filled 2022-03-31: qty 11, 30d supply, fill #0

## 2022-03-31 MED ORDER — LEVOCETIRIZINE DIHYDROCHLORIDE 5 MG PO TABS
5.0000 mg | ORAL_TABLET | Freq: Every day | ORAL | 11 refills | Status: DC | PRN
  Filled 2022-03-31: qty 60, 60d supply, fill #0

## 2022-03-31 MED ORDER — OLOPATADINE HCL 0.2 % OP SOLN
1.0000 [drp] | OPHTHALMIC | 11 refills | Status: DC
  Filled 2022-03-31: qty 2.5, 50d supply, fill #0

## 2022-03-31 NOTE — Patient Instructions (Addendum)
  1.  Continue immunotherapy   2.  Continue injectable epinephrine device if required  3.  Continue OTC antihistamine and Pataday if needed  4. If needed: AirSupra - 2 inhalations every 4-6 hours (coupon)  5. Return to clinic  in 1 year or earlier if problem

## 2022-03-31 NOTE — Progress Notes (Unsigned)
Harry [name] returns to this clinic in evaluation of a inflammatory event that occurred in his airway approximately 5 weeks ago for which we saw Harry [name] in this clinic on 24 March 2022 and treated Harry [name] with a systemic steroid and a broad-spectrum antibiotic to cover possible mycoplasma.  Over the course of the past 48 hours he has improved significantly.  Prior to the point time he still had a lot of cough and congestion in his chest but he finally feels as though he is doing better.  I have given Harry [name] a combination albuterol/budesonide inhaler to be used if needed should he develop more respiratory tract symptoms associated with congestion in his chest.  Will continue on immunotherapy and we will see Harry [name] back in this clinic in 1 year or earlier if there is a problem.

## 2022-04-09 ENCOUNTER — Ambulatory Visit (INDEPENDENT_AMBULATORY_CARE_PROVIDER_SITE_OTHER): Payer: Medicare Other

## 2022-04-09 ENCOUNTER — Other Ambulatory Visit: Payer: Self-pay | Admitting: *Deleted

## 2022-04-09 DIAGNOSIS — J309 Allergic rhinitis, unspecified: Secondary | ICD-10-CM

## 2022-04-09 DIAGNOSIS — J189 Pneumonia, unspecified organism: Secondary | ICD-10-CM

## 2022-04-10 LAB — SPECIMEN STATUS REPORT

## 2022-04-11 LAB — CBC WITH DIFFERENTIAL/PLATELET
Basophils Absolute: 0 10*3/uL (ref 0.0–0.2)
Basos: 0 %
EOS (ABSOLUTE): 0.2 10*3/uL (ref 0.0–0.4)
Eos: 3 %
Hematocrit: 45.9 % (ref 37.5–51.0)
Hemoglobin: 14.8 g/dL (ref 13.0–17.7)
Immature Grans (Abs): 0 10*3/uL (ref 0.0–0.1)
Immature Granulocytes: 0 %
Lymphocytes Absolute: 1.2 10*3/uL (ref 0.7–3.1)
Lymphs: 20 %
MCH: 31.3 pg (ref 26.6–33.0)
MCHC: 32.2 g/dL (ref 31.5–35.7)
MCV: 97 fL (ref 79–97)
Monocytes Absolute: 0.5 10*3/uL (ref 0.1–0.9)
Monocytes: 8 %
Neutrophils Absolute: 4 10*3/uL (ref 1.4–7.0)
Neutrophils: 69 %
Platelets: 195 10*3/uL (ref 150–450)
RBC: 4.73 x10E6/uL (ref 4.14–5.80)
RDW: 11.8 % (ref 11.6–15.4)
WBC: 5.8 10*3/uL (ref 3.4–10.8)

## 2022-04-16 ENCOUNTER — Ambulatory Visit (INDEPENDENT_AMBULATORY_CARE_PROVIDER_SITE_OTHER): Payer: Medicare Other

## 2022-04-16 DIAGNOSIS — J309 Allergic rhinitis, unspecified: Secondary | ICD-10-CM

## 2022-05-05 ENCOUNTER — Other Ambulatory Visit (HOSPITAL_COMMUNITY): Payer: Self-pay

## 2022-05-05 MED ORDER — PREDNISOLONE ACETATE 1 % OP SUSP
1.0000 [drp] | Freq: Four times a day (QID) | OPHTHALMIC | 1 refills | Status: DC
  Filled 2022-05-05: qty 5, 25d supply, fill #0

## 2022-05-05 MED ORDER — GATIFLOXACIN 0.5 % OP SOLN
1.0000 [drp] | Freq: Four times a day (QID) | OPHTHALMIC | 1 refills | Status: DC
  Filled 2022-05-05: qty 5, 25d supply, fill #0

## 2022-05-05 MED ORDER — KETOROLAC TROMETHAMINE 0.5 % OP SOLN
1.0000 [drp] | Freq: Four times a day (QID) | OPHTHALMIC | 1 refills | Status: DC
  Filled 2022-05-05: qty 5, 25d supply, fill #0

## 2022-05-07 ENCOUNTER — Other Ambulatory Visit (HOSPITAL_COMMUNITY): Payer: Self-pay

## 2022-05-26 ENCOUNTER — Other Ambulatory Visit (HOSPITAL_COMMUNITY): Payer: Self-pay

## 2022-05-26 MED ORDER — PREDNISOLONE ACETATE 1 % OP SUSP
1.0000 [drp] | Freq: Four times a day (QID) | OPHTHALMIC | 1 refills | Status: DC
  Filled 2022-05-26: qty 5, 25d supply, fill #0

## 2022-05-26 MED ORDER — GATIFLOXACIN 0.5 % OP SOLN
1.0000 [drp] | Freq: Four times a day (QID) | OPHTHALMIC | 1 refills | Status: DC
  Filled 2022-05-26: qty 5, 25d supply, fill #0
  Filled 2022-05-26: qty 5, 19d supply, fill #0
  Filled 2022-06-12: qty 5, 19d supply, fill #1

## 2022-05-26 MED ORDER — KETOROLAC TROMETHAMINE 0.5 % OP SOLN
1.0000 [drp] | Freq: Four times a day (QID) | OPHTHALMIC | 1 refills | Status: DC
  Filled 2022-05-26: qty 5, 19d supply, fill #0
  Filled 2022-06-12: qty 5, 19d supply, fill #1

## 2022-05-27 ENCOUNTER — Other Ambulatory Visit (HOSPITAL_COMMUNITY): Payer: Self-pay

## 2022-06-01 ENCOUNTER — Ambulatory Visit (INDEPENDENT_AMBULATORY_CARE_PROVIDER_SITE_OTHER): Payer: Medicare Other | Admitting: *Deleted

## 2022-06-01 DIAGNOSIS — J309 Allergic rhinitis, unspecified: Secondary | ICD-10-CM | POA: Diagnosis not present

## 2022-06-08 ENCOUNTER — Ambulatory Visit (INDEPENDENT_AMBULATORY_CARE_PROVIDER_SITE_OTHER): Payer: Medicare Other | Admitting: *Deleted

## 2022-06-08 DIAGNOSIS — J309 Allergic rhinitis, unspecified: Secondary | ICD-10-CM

## 2022-06-12 ENCOUNTER — Other Ambulatory Visit (HOSPITAL_COMMUNITY): Payer: Self-pay

## 2022-06-15 ENCOUNTER — Ambulatory Visit (INDEPENDENT_AMBULATORY_CARE_PROVIDER_SITE_OTHER): Payer: Medicare Other | Admitting: *Deleted

## 2022-06-15 DIAGNOSIS — J309 Allergic rhinitis, unspecified: Secondary | ICD-10-CM

## 2022-07-15 NOTE — Progress Notes (Signed)
Cardiology Office Note:   Date:  07/17/2022  NAME:  Harry [name]    MRN: [id number] DOB:  02-01-1941   PCP:  Crist Infante, MD  Cardiologist:  Evalina Field, MD  Electrophysiologist:  None   Referring MD: Crist Infante, MD   Chief Complaint  Patient presents with   Follow-up         History of Present Illness:   Harry [name] is a 82 y.o. male with a hx of coronary calcium who presents for follow-up. Seen for chest discomfort and palpitations. Negative work-up.  He reports he is doing well.  EKG shows PACs.  No symptoms from this.  Reports he is playing golf.  He is walking 1 to 2 miles per day.  No chest pains or trouble breathing.  LDL cholesterol 139.  Minimal coronary calcium.  Still not wanting to take a statin.  Aspirin not needed.  Overall without cardiac complaints.  CV examination unremarkable.  Problem List Coronary calcium -CAC 72 (21st percentile) -minimal CAD (<25%)  2. HLD -T chol 215, HDL 61, LDL 139, TG 83 3. PACs -5.5% burden  Past Medical History: Past Medical History:  Diagnosis Date   Allergic rhinitis    Allergy    Asthma    50 years ago   Giardia     Past Surgical History: Past Surgical History:  Procedure Laterality Date   abdominal aorta ultrasound  07/22/2009   mild atherosclerotic changes without aneurysm   COLONOSCOPY     HAND SURGERY  age 21   for infection   NASAL HEMORRHAGE CONTROL N/A 01/08/2017   Procedure: MINOR EPISTAXIS CONTROL;  Surgeon: Rozetta Nunnery, MD;  Location: [facility];  Service: ENT;  Laterality: N/A;   NASAL SINUS SURGERY  2006   NM MYOVIEW LTD  03/19/2009   normal/ EF- 68%   TONSILLECTOMY     age 61   TOTAL HIP ARTHROPLASTY Right 01/30/2021   Procedure: TOTAL HIP ARTHROPLASTY ANTERIOR APPROACH;  Surgeon: Paralee Cancel, MD;  Location: [facility];  Service: Orthopedics;  Laterality: Right;    Current Medications: Current Meds  Medication Sig   NONFORMULARY OR COMPOUNDED ITEM Allergy  Vaccine 1:10 Given at [facility]     Allergies:    Apple juice, Bee venom, Blackberry [rubus fruticosus], Onion, Peach flavor, Pear, and Plum pulp   Social History: Social History   Socioeconomic History   Marital status: Married    Spouse name: Not on file   Number of children: 3   Years of education: Not on file   Highest education level: Not on file  Occupational History   Occupation: IT consultant  Tobacco Use   Smoking status: Former    Types: Pipe   Smokeless tobacco: Never   Tobacco comments:    40 years ago  Vaping Use   Vaping Use: Never used  Substance and Sexual Activity   Alcohol use: Yes    Comment: occasionally   Drug use: No   Sexual activity: Not on file  Other Topics Concern   Not on file  Social History Narrative   Not on file   Social Determinants of Health   Financial Resource Strain: Not on file  Food Insecurity: Not on file  Transportation Needs: Not on file  Physical Activity: Not on file  Stress: Not on file  Social Connections: Not on file     Family History: The patient's family history includes Allergic rhinitis in an other family member;  Aortic aneurysm (age of onset: 3) in his father; Hypertension in his brother; Lung cancer (age of onset: 56) in his mother; Pulmonary embolism in his maternal grandfather. There is no history of Heart attack, Stroke, Colon cancer, Colon polyps, Esophageal cancer, Rectal cancer, or Stomach cancer.  ROS:   All other ROS reviewed and negative. Pertinent positives noted in the HPI.     EKGs/Labs/Other Studies Reviewed:   The following studies were personally reviewed by me today:  EKG:  EKG is ordered today.  The ekg ordered today demonstrates normal sinus rhythm heart 73, PACs noted, first-degree AV block, and was personally reviewed by me.   03/03/2020 1. Brief ectopic atrial tachycardia episodes detected (14 episodes in 7 days; longest 4.3 seconds). 2. Frequent PACs (5.5%). 3. Occasional  PVCs (1.2% burden).  4. No atrial fibrillation.  5. First degree AV Block present (benign).   Recent Labs: 04/09/2022: Hemoglobin 14.8; Platelets 195   Recent Lipid Panel    Component Value Date/Time   CHOL 206 (H) 05/17/2012 0817   TRIG 68.0 05/17/2012 0817   HDL 46.80 05/17/2012 0817   CHOLHDL 4 05/17/2012 0817   VLDL 13.6 05/17/2012 0817   LDLDIRECT 145.5 05/17/2012 0817    Physical Exam:   VS:  BP 110/64 (BP Location: Left Arm, Patient Position: Sitting, Cuff Size: Normal)   Pulse 73   Ht 6\' 3"  (1.905 m)   Wt 208 lb (94.3 kg)   BMI 26.00 kg/m    Wt Readings from Last 3 Encounters:  07/17/22 208 lb (94.3 kg)  03/31/22 204 lb 4.8 oz (92.7 kg)  03/24/22 207 lb 9.6 oz (94.2 kg)    General: Well nourished, well developed, in no acute distress Head: Atraumatic, normal size  Eyes: PEERLA, EOMI  Neck: Supple, no JVD Endocrine: No thryomegaly Cardiac: Normal S1, S2; RRR; no murmurs, rubs, or gallops Lungs: Clear to auscultation bilaterally, no wheezing, rhonchi or rales  Abd: Soft, nontender, no hepatomegaly  Ext: No edema, pulses 2+ Musculoskeletal: No deformities, BUE and BLE strength normal and equal Skin: Warm and dry, no rashes   Neuro: Alert and oriented to person, place, time, and situation, CNII-XII grossly intact, no focal deficits  Psych: Normal mood and affect   ASSESSMENT:   Harry [name] is a 82 y.o. male who presents for the following: 1. Agatston coronary artery calcium score less than 100   2. PAC (premature atrial contraction)     PLAN:   1. Agatston coronary artery calcium score less than 100 -Coronary calcium score the 21st percentile.  LDL cholesterol 139.  Not wanting to take a statin.  He will continue with lifestyle and dietary modification.  2. PAC (premature atrial contraction) -5.5% PAC burden on monitor in 2021.  EKG does show PACs.  No A-fib.  No symptoms from this.  For now we will continue to monitor this.  No need for medication.  He  is without symptoms.      Disposition: Return in about 1 year (around 07/17/2023).  Medication Adjustments/Labs and Tests Ordered: Current medicines are reviewed at length with the patient today.  Concerns regarding medicines are outlined above.  Orders Placed This Encounter  Procedures   EKG 12-Lead   No orders of the defined types were placed in this encounter.   Patient Instructions  Medication Instructions:  Your physician recommends that you continue on your current medications as directed. Please refer to the Current Medication list given to you today.  *If you need  a refill on your cardiac medications before your next appointment, please call your pharmacy*  Follow-Up: At [facility], you and your health needs are our priority.  As part of our continuing mission to provide you with exceptional heart care, we have created designated Provider Care Teams.  These Care Teams include your primary Cardiologist (physician) and Advanced Practice Providers (APPs -  Physician Assistants and Nurse Practitioners) who all work together to provide you with the care you need, when you need it.  We recommend signing up for the patient portal called "MyChart".  Sign up information is provided on this After Visit Summary.  MyChart is used to connect with patients for Virtual Visits (Telemedicine).  Patients are able to view lab/test results, encounter notes, upcoming appointments, etc.  Non-urgent messages can be sent to your provider as well.   To learn more about what you can do with MyChart, go to [email or web address].    Your next appointment:   1 year(s)  Provider:   Eleonore Chiquito, MD      Time Spent with Patient: I have spent a total of 25 minutes with patient reviewing hospital notes, telemetry, EKGs, labs and examining the patient as well as establishing an assessment and plan that was discussed with the patient.  > 50% of time was spent in direct patient  care.  Signed, Harry [name]. Audie Box, MD, [place]  [address], [place], [place] [address] [phone]  07/17/2022 1:52 PM

## 2022-07-17 ENCOUNTER — Ambulatory Visit (INDEPENDENT_AMBULATORY_CARE_PROVIDER_SITE_OTHER): Payer: Medicare Other | Admitting: Cardiovascular Disease

## 2022-07-17 ENCOUNTER — Encounter (HOSPITAL_BASED_OUTPATIENT_CLINIC_OR_DEPARTMENT_OTHER): Payer: Self-pay | Admitting: Cardiovascular Disease

## 2022-07-17 VITALS — BP 110/64 | HR 73 | Ht 75.0 in | Wt 208.0 lb

## 2022-07-17 DIAGNOSIS — I491 Atrial premature depolarization: Secondary | ICD-10-CM | POA: Diagnosis not present

## 2022-07-17 DIAGNOSIS — R931 Abnormal findings on diagnostic imaging of heart and coronary circulation: Secondary | ICD-10-CM | POA: Diagnosis not present

## 2022-07-17 NOTE — Patient Instructions (Signed)
Medication Instructions:  Your physician recommends that you continue on your current medications as directed. Please refer to the Current Medication list given to you today.  *If you need a refill on your cardiac medications before your next appointment, please call your pharmacy*  Follow-Up: At [facility], you and your health needs are our priority.  As part of our continuing mission to provide you with exceptional heart care, we have created designated Provider Care Teams.  These Care Teams include your primary Cardiologist (physician) and Advanced Practice Providers (APPs -  Physician Assistants and Nurse Practitioners) who all work together to provide you with the care you need, when you need it.  We recommend signing up for the patient portal called "MyChart".  Sign up information is provided on this After Visit Summary.  MyChart is used to connect with patients for Virtual Visits (Telemedicine).  Patients are able to view lab/test results, encounter notes, upcoming appointments, etc.  Non-urgent messages can be sent to your provider as well.   To learn more about what you can do with MyChart, go to [email or web address].    Your next appointment:   1 year(s)  Provider:   Eleonore Chiquito, MD

## 2022-07-20 ENCOUNTER — Ambulatory Visit (INDEPENDENT_AMBULATORY_CARE_PROVIDER_SITE_OTHER): Payer: Medicare Other | Admitting: *Deleted

## 2022-07-20 DIAGNOSIS — J309 Allergic rhinitis, unspecified: Secondary | ICD-10-CM

## 2022-07-28 ENCOUNTER — Ambulatory Visit (INDEPENDENT_AMBULATORY_CARE_PROVIDER_SITE_OTHER): Payer: Medicare Other | Admitting: *Deleted

## 2022-07-28 DIAGNOSIS — J309 Allergic rhinitis, unspecified: Secondary | ICD-10-CM

## 2022-07-28 MED ORDER — METHYLPREDNISOLONE ACETATE 80 MG/ML IJ SUSP
80.0000 mg | Freq: Once | INTRAMUSCULAR | Status: AC
  Administered 2022-07-28: 80 mg via INTRAMUSCULAR

## 2022-08-03 ENCOUNTER — Ambulatory Visit (INDEPENDENT_AMBULATORY_CARE_PROVIDER_SITE_OTHER): Payer: Medicare Other | Admitting: *Deleted

## 2022-08-03 DIAGNOSIS — J309 Allergic rhinitis, unspecified: Secondary | ICD-10-CM

## 2022-08-27 ENCOUNTER — Other Ambulatory Visit (HOSPITAL_COMMUNITY): Payer: Self-pay

## 2022-08-27 MED ORDER — TAMSULOSIN HCL 0.4 MG PO CAPS
0.4000 mg | ORAL_CAPSULE | Freq: Every day | ORAL | 1 refills | Status: DC
  Filled 2022-08-27: qty 30, 30d supply, fill #0

## 2022-09-17 ENCOUNTER — Other Ambulatory Visit (HOSPITAL_COMMUNITY): Payer: Self-pay

## 2022-09-17 ENCOUNTER — Telehealth: Payer: Self-pay | Admitting: Allergy and Immunology

## 2022-09-17 MED ORDER — EPINEPHRINE 0.3 MG/0.3ML IJ SOAJ
0.3000 mg | Freq: Once | INTRAMUSCULAR | 1 refills | Status: AC
  Filled 2022-09-17: qty 2, 2d supply, fill #0

## 2022-09-17 NOTE — Telephone Encounter (Signed)
Macguire in the office and is requesting a prescription for an Epi-Pen.  He states he has a bee allergy and has been prescribed an Epi-Pen in the past but "let it go" and now he wants one.  [facility] uses [facility].

## 2022-09-17 NOTE — Telephone Encounter (Signed)
Prescription for Epi pen sent to pharmacy noted below.

## 2022-09-18 ENCOUNTER — Other Ambulatory Visit (HOSPITAL_COMMUNITY): Payer: Self-pay

## 2022-09-21 ENCOUNTER — Other Ambulatory Visit (HOSPITAL_COMMUNITY): Payer: Self-pay

## 2022-09-21 MED ORDER — CEPHALEXIN 500 MG PO CAPS
500.0000 mg | ORAL_CAPSULE | Freq: Four times a day (QID) | ORAL | 3 refills | Status: DC
  Filled 2022-09-21: qty 30, 8d supply, fill #0

## 2022-09-22 ENCOUNTER — Other Ambulatory Visit (HOSPITAL_COMMUNITY): Payer: Self-pay

## 2022-09-22 MED ORDER — TAMSULOSIN HCL 0.4 MG PO CAPS
0.8000 mg | ORAL_CAPSULE | Freq: Every day | ORAL | 0 refills | Status: DC
  Filled 2022-09-22 (×2): qty 180, 90d supply, fill #0

## 2022-09-23 ENCOUNTER — Other Ambulatory Visit: Payer: Self-pay

## 2022-09-23 ENCOUNTER — Other Ambulatory Visit (HOSPITAL_COMMUNITY): Payer: Self-pay

## 2022-09-24 ENCOUNTER — Other Ambulatory Visit (HOSPITAL_COMMUNITY): Payer: Self-pay

## 2022-09-25 ENCOUNTER — Other Ambulatory Visit (HOSPITAL_COMMUNITY): Payer: Self-pay

## 2022-09-25 MED ORDER — PREDNISOLONE ACETATE 1 % OP SUSP
4.0000 [drp] | Freq: Two times a day (BID) | OPHTHALMIC | 4 refills | Status: DC
  Filled 2022-09-25: qty 5, 7d supply, fill #0

## 2022-09-25 MED ORDER — MOMETASONE FUROATE 0.1 % EX CREA
TOPICAL_CREAM | Freq: Every day | CUTANEOUS | 4 refills | Status: AC
  Filled 2022-09-25: qty 15, 25d supply, fill #0

## 2023-01-04 ENCOUNTER — Ambulatory Visit (INDEPENDENT_AMBULATORY_CARE_PROVIDER_SITE_OTHER): Payer: Medicare Other | Admitting: *Deleted

## 2023-01-04 DIAGNOSIS — J309 Allergic rhinitis, unspecified: Secondary | ICD-10-CM

## 2023-01-14 ENCOUNTER — Ambulatory Visit (INDEPENDENT_AMBULATORY_CARE_PROVIDER_SITE_OTHER): Payer: Medicare Other

## 2023-01-14 DIAGNOSIS — J309 Allergic rhinitis, unspecified: Secondary | ICD-10-CM | POA: Diagnosis not present

## 2023-01-19 ENCOUNTER — Ambulatory Visit (INDEPENDENT_AMBULATORY_CARE_PROVIDER_SITE_OTHER): Payer: Medicare Other | Admitting: *Deleted

## 2023-01-19 DIAGNOSIS — J309 Allergic rhinitis, unspecified: Secondary | ICD-10-CM

## 2023-01-27 ENCOUNTER — Telehealth: Payer: Self-pay | Admitting: Allergy and Immunology

## 2023-01-27 ENCOUNTER — Other Ambulatory Visit (HOSPITAL_COMMUNITY): Payer: Self-pay

## 2023-01-27 DIAGNOSIS — U071 COVID-19: Secondary | ICD-10-CM

## 2023-01-27 DIAGNOSIS — R059 Cough, unspecified: Secondary | ICD-10-CM

## 2023-01-27 MED ORDER — PREDNISONE 10 MG PO TABS
10.0000 mg | ORAL_TABLET | Freq: Every day | ORAL | 0 refills | Status: AC
Start: 2023-01-27 — End: 2023-02-02
  Filled 2023-01-27: qty 5, 5d supply, fill #0

## 2023-01-27 NOTE — Telephone Encounter (Signed)
Per Provider:   It would be better to use prednisone 10 mg 1 time per day for 5 days   Called patient - DOB/Pharmacy verified - advised of provider notation above.  Patient verbalized understanding, no further questions.  Prednisone prescription sent in to pharmacy.

## 2023-01-27 NOTE — Telephone Encounter (Signed)
Harry [name] called in and states he has covid.  He is currently taking Paxlovid and he is on day 4.  Harry [name] states he is feeling some chest tightness, however, he is not wheezing, no shortness of breath, and no chest pain.  Harry [name] states he actually feels pretty good considering the chest tightness but is in fear he will get pneumonia.  Harry [name] wanted to know if Septum 500mg  BID could be called in to [facility].  Harry [name] states he does have coughing spells. He wanted to know if Dr. Lucie Leather wanted/needed to listen to his chest.

## 2023-01-27 NOTE — Telephone Encounter (Signed)
Sorry, CEFTUM 500mg  BID

## 2023-02-08 ENCOUNTER — Other Ambulatory Visit (HOSPITAL_COMMUNITY): Payer: Self-pay

## 2023-02-08 MED ORDER — CEFUROXIME AXETIL 500 MG PO TABS
500.0000 mg | ORAL_TABLET | Freq: Two times a day (BID) | ORAL | 1 refills | Status: DC
  Filled 2023-02-08: qty 30, 15d supply, fill #0

## 2023-02-19 ENCOUNTER — Other Ambulatory Visit (HOSPITAL_COMMUNITY): Payer: Self-pay

## 2023-02-25 NOTE — Progress Notes (Signed)
VIALS EXP 05-07-23

## 2023-02-26 DIAGNOSIS — J3081 Allergic rhinitis due to animal (cat) (dog) hair and dander: Secondary | ICD-10-CM | POA: Diagnosis not present

## 2023-03-01 DIAGNOSIS — J3089 Other allergic rhinitis: Secondary | ICD-10-CM | POA: Diagnosis not present

## 2023-03-05 ENCOUNTER — Ambulatory Visit (INDEPENDENT_AMBULATORY_CARE_PROVIDER_SITE_OTHER): Payer: Medicare Other | Admitting: *Deleted

## 2023-03-05 DIAGNOSIS — J309 Allergic rhinitis, unspecified: Secondary | ICD-10-CM

## 2023-03-11 ENCOUNTER — Ambulatory Visit (INDEPENDENT_AMBULATORY_CARE_PROVIDER_SITE_OTHER): Payer: Medicare Other

## 2023-03-11 DIAGNOSIS — J309 Allergic rhinitis, unspecified: Secondary | ICD-10-CM | POA: Diagnosis not present

## 2023-03-23 ENCOUNTER — Other Ambulatory Visit (HOSPITAL_COMMUNITY): Payer: Self-pay

## 2023-03-23 MED ORDER — TAMSULOSIN HCL 0.4 MG PO CAPS
0.4000 mg | ORAL_CAPSULE | Freq: Two times a day (BID) | ORAL | 3 refills | Status: DC
  Filled 2023-03-23: qty 180, 90d supply, fill #0

## 2023-03-25 ENCOUNTER — Ambulatory Visit (INDEPENDENT_AMBULATORY_CARE_PROVIDER_SITE_OTHER): Payer: Medicare Other | Admitting: Otolaryngology

## 2023-03-25 ENCOUNTER — Encounter (INDEPENDENT_AMBULATORY_CARE_PROVIDER_SITE_OTHER): Payer: Self-pay

## 2023-03-25 VITALS — Ht 75.0 in | Wt 201.0 lb

## 2023-03-25 DIAGNOSIS — R040 Epistaxis: Secondary | ICD-10-CM

## 2023-03-26 DIAGNOSIS — R040 Epistaxis: Secondary | ICD-10-CM | POA: Insufficient documentation

## 2023-03-26 NOTE — Progress Notes (Signed)
Patient ID: Harry [name], male   DOB: Jan 12, 1941, 82 y.o.   MRN: [id number]  New complaint: Recurrent right epistaxis  Procedure:  Endoscopic control of recurrent right epistaxis  Indication: The patient is an 82 year old male who presents today with a new complaint of recurrent right epistaxis.  According to the patient, he has a history of recurrent right-sided nasal bleeding.  He was previously treated with multiple cauterization procedures by Dr. Dillard Cannon.  He also has a history of nasal septal deviation.  He underwent sinus surgery more than 15 years ago.  His last epistaxis was last night.  Description:  The right nasal cavity is sprayed with topical xylocaine and neo-synephrine.  After adequate anesthesia is achieved, the nasal cavity is examined with a 0 rigid endoscope.  Hypervascular areas are noted at the superior aspect of the nasal septum.  A silver nitrate stick is inserted in parallel with the 0 endoscope.  It is used to repeatedly cauterized the hypervascular areas.  Good hemostasis is achieved.  The patient tolerated the procedure well.  Assessment: 1.  Hypervascular areas are noted on the right superior nasal septum.   2.  No suspicious mass or lesion is noted today.  Plan: 1. Endoscopic cauterization of the right superior nasal septum. 2. The nasal endoscopy findings are reviewed with the patient. 3. Nasal ointment/humidifier to treat the nasal dryness. 4. The patient will return for re-evaluation in 1 month.

## 2023-03-29 ENCOUNTER — Ambulatory Visit (INDEPENDENT_AMBULATORY_CARE_PROVIDER_SITE_OTHER): Payer: Medicare Other | Admitting: *Deleted

## 2023-03-29 DIAGNOSIS — J309 Allergic rhinitis, unspecified: Secondary | ICD-10-CM

## 2023-04-06 ENCOUNTER — Other Ambulatory Visit (HOSPITAL_COMMUNITY): Payer: Self-pay

## 2023-04-06 MED ORDER — PREDNISONE 10 MG PO TABS
ORAL_TABLET | ORAL | 0 refills | Status: AC
  Filled 2023-04-06: qty 21, 12d supply, fill #0

## 2023-04-07 ENCOUNTER — Other Ambulatory Visit (HOSPITAL_COMMUNITY): Payer: Self-pay

## 2023-04-07 MED ORDER — ROSUVASTATIN CALCIUM 10 MG PO TABS
10.0000 mg | ORAL_TABLET | Freq: Every day | ORAL | 3 refills | Status: DC
  Filled 2023-04-07: qty 90, 90d supply, fill #0

## 2023-04-12 ENCOUNTER — Ambulatory Visit (INDEPENDENT_AMBULATORY_CARE_PROVIDER_SITE_OTHER): Payer: Self-pay

## 2023-04-12 DIAGNOSIS — J309 Allergic rhinitis, unspecified: Secondary | ICD-10-CM | POA: Diagnosis not present

## 2023-04-19 ENCOUNTER — Ambulatory Visit (INDEPENDENT_AMBULATORY_CARE_PROVIDER_SITE_OTHER): Payer: Self-pay | Admitting: *Deleted

## 2023-04-19 DIAGNOSIS — J309 Allergic rhinitis, unspecified: Secondary | ICD-10-CM

## 2023-04-28 ENCOUNTER — Encounter: Payer: Self-pay | Admitting: Gastroenterology

## 2023-04-28 ENCOUNTER — Ambulatory Visit: Payer: Medicare Other | Admitting: Gastroenterology

## 2023-04-28 VITALS — BP 134/80 | HR 59 | Ht 75.0 in | Wt 206.2 lb

## 2023-04-28 DIAGNOSIS — K5900 Constipation, unspecified: Secondary | ICD-10-CM | POA: Diagnosis not present

## 2023-04-28 DIAGNOSIS — K6289 Other specified diseases of anus and rectum: Secondary | ICD-10-CM

## 2023-04-28 NOTE — Patient Instructions (Addendum)
  _______________________________________________________  If your blood pressure at your visit was 140/90 or greater, please contact your primary care physician to follow up on this.  _______________________________________________________  If you are age 84 or older, your body mass index should be between 23-30. Your Body mass index is 25.77 kg/m. If this is out of the aforementioned range listed, please consider follow up with your Primary Care Provider.  If you are age 24 or younger, your body mass index should be between 19-25. Your Body mass index is 25.77 kg/m. If this is out of the aformentioned range listed, please consider follow up with your Primary Care Provider.   ________________________________________________________  The [facility] providers would like to encourage you to use MYCHART to communicate with providers for non-urgent requests or questions.  Due to long hold times on the telephone, sending your provider a message by [facility] may be a faster and more efficient way to get a response.  Please allow 48 business hours for a response.  Please remember that this is for non-urgent requests.  _______________________________ It was a pleasure to see you today!  Thank you for trusting me with your gastrointestinal care!

## 2023-04-28 NOTE — Progress Notes (Signed)
 [facility] Consult Note:  History: Harry [name] 04/28/2023  Referring provider: [name], MD  Reason for consult/chief complaint: Rectal Pain and Constipation   Subjective  Prior history:  March 2019 office note from Dr. 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  He has a history of Giardia in February 2018 which was successfully treated with metronidazole .  He had mild recurrent diarrhea a couple months later and a GI pathogen panel was repeated in May 2018 and it was negative.  Celiac antibody panel was obtained in March 2018 and it was negative.  He felt well until with normal bowel habits until 1 month ago when he had a recurrence of diarrhea with a foul odor.  He notes about 1-2 less well formed stools per day and frequently he will have loose, watery stools.  He has not noted any bleeding.  He occasionally notes a mild rumbling sensation in his lower abdomen but no pain or cramping.  His appetite is very good and his weight is stable.  No fevers. No recent antibiotic usage. He relates a coworker was recently diagnosed with Giardia.  Gastrointestinal pathogen panel was ordered on March 14 but has not returned a result.  He states he makes an effort to have a high amount of raw fruits and raw vegetables in his regular diet.  Colonoscopy performed in June 2013 in [place]  for rectal bleeding was normal.  Colonoscopy performed by Dr. [name] in March 2013 was normal.   Negative GI pathogen panel at that time.  Colonoscopy March 2019 normal to the terminal ileum, biopsies negative for microscopic colitis.  Dr. [name] contacted Dr. Buddy in 2021 with recurrence of acute diarrhea, then had a negative GI pathogen panel. He contacted our office again in 2023 with similar symptoms, GI pathogen panel was ordered but there are no visible results (not clear it was done).    Discussed the  use of AI scribe software for clinical note transcription with the patient, who gave verbal consent to proceed.  History of Present Illness   The patient, with a self-reported history of sensitive or irritable gut, presents with recent onset of rectal discomfort and itching, suspected to be due to a fissure or hemorrhoid. The symptoms began following the holiday season, during which the patient admits to overindulgence and lack of discretion in food choices. This led to a significant episode of constipation with a painful bowel movement. No blood was noted in the stool.  However, since then he has had some anorectal discomfort that at this point is mild and feels most like itching.  The patient has been managing the symptoms with prunes and stool softeners, which seem to have improved the constipation. A sitz bath was also tried, which provided some relief. The patient still experiences itching and discomfort in the rectal area.  The patient also mentions a tendency towards a nervous bowel, with anxiety leading to bowel symptoms. He has identified certain dietary triggers, such as onions and possibly gluten, which he is trying to avoid. There are no reported symptoms of frequent heartburn, trouble swallowing, or vomiting. The patient's weight has been stable, with a recent intentional loss from 199 to 195 pounds.     ROS:  Review of Systems Denies chest pain dyspnea or dysuria  Past Medical History: Past Medical History:  Diagnosis Date   Allergic rhinitis    Allergy   Asthma    50 years ago   Giardia      Past Surgical History: Past Surgical History:  Procedure Laterality Date   abdominal aorta ultrasound  07/22/2009   mild atherosclerotic changes without aneurysm   COLONOSCOPY     HAND SURGERY  age 62   for infection   NASAL HEMORRHAGE CONTROL N/A 01/08/2017   Procedure: MINOR EPISTAXIS CONTROL;  Surgeon: Ethyl Lonni BRAVO, MD;  Location: [facility];   Service: ENT;  Laterality: N/A;   NASAL SINUS SURGERY  2006   NM MYOVIEW  LTD  03/19/2009   normal/ EF- 68%   TONSILLECTOMY     age 47   TOTAL HIP ARTHROPLASTY Right 01/30/2021   Procedure: TOTAL HIP ARTHROPLASTY ANTERIOR APPROACH;  Surgeon: Ernie Cough, MD;  Location: [facility];  Service: Orthopedics;  Laterality: Right;     Family History: Family History  Problem Relation Age of Onset   Aortic aneurysm Father 71   Lung cancer Mother 70   Allergic rhinitis Other        GM   Hypertension Brother    Pulmonary embolism Maternal Grandfather    Heart attack Neg Hx    Stroke Neg Hx    Colon cancer Neg Hx    Colon polyps Neg Hx    Esophageal cancer Neg Hx    Rectal cancer Neg Hx    Stomach cancer Neg Hx     Social History: Social History   Socioeconomic History   Marital status: Married    Spouse name: Not on file   Number of children: 3   Years of education: Not on file   Highest education level: Not on file  Occupational History   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  Tobacco Use   Smoking status: Former    Types: Pipe   Smokeless tobacco: Never   Tobacco comments:    40 years ago  Vaping Use   Vaping status: Never Used  Substance and Sexual Activity   Alcohol  use: Yes    Comment: occasionally   Drug use: No   Sexual activity: Not on file  Other Topics Concern   Not on file  Social History Narrative   Not on file   Social Drivers of Health   Financial Resource Strain: Not on file  Food Insecurity: Not on file  Transportation Needs: Not on file  Physical Activity: Not on file  Stress: Not on file  Social Connections: Not on file    Allergies: Allergies  Allergen Reactions   Apple Juice    Bee Venom Other (See Comments)    unknown   Blackberry [Rubus Fruticosus]    Onion    Peach Flavoring Agent (Non-Screening)    Pear    Plum Pulp     Outpatient Meds: Current Outpatient Medications  Medication Sig Dispense Refill   cephALEXin  (KEFLEX ) 500 MG capsule Take  1 capsule (500 mg total) by mouth 4 (four) times daily. 30 capsule 3   mometasone  (ELOCON ) 0.1 % cream Apply topically daily as needed for itching. 15 g 4   rosuvastatin  (CRESTOR ) 10 MG tablet Take 1 tablet (10 mg total) by mouth daily. 90 tablet 3   tamsulosin  (FLOMAX ) 0.4 MG CAPS capsule Take 1 capsule by mouth at bedtime. 30 capsule 1   NONFORMULARY OR COMPOUNDED ITEM Allergy  Vaccine 1:10 Given at [facility] (Patient not taking: Reported on 03/25/2023)     prednisoLONE  acetate (PRED FORTE ) 1 % ophthalmic suspension Place 4 drops into each ear  2 (two) times daily as needed for itch. (Patient not taking: Reported on 04/28/2023) 5 mL 4   No current facility-administered medications for this visit.      ___________________________________________________________________ Objective   Exam:  BP 134/80   Pulse (!) 59   Ht 6' 3 (1.905 m)   Wt 206 lb 3.2 oz (93.5 kg)   SpO2 98%   BMI 25.77 kg/m  Wt Readings from Last 3 Encounters:  04/28/23 206 lb 3.2 oz (93.5 kg)  03/25/23 201 lb (91.2 kg)  07/17/22 208 lb (94.3 kg)    General: Very well-appearing Eyes: sclera anicteric, no redness ENT: oral mucosa moist without lesions, no cervical or supraclavicular lymphadenopathy CV: Regular without appreciable murmur, no JVD, no peripheral edema Resp: clear to auscultation bilaterally, normal RR and effort noted GI: soft, no tenderness, with active bowel sounds. No guarding or palpable organomegaly noted. Skin; warm and dry, no rash or jaundice noted Neuro: awake, alert and oriented x 3. Normal gross motor function and fluent speech Perianal exam normal with healthy appearing skin. DRE with normal resting sphincter tone, no palpable fissure, tenderness or internal lesion in the distal rectum/anal canal Anoscopy reveals relatively small mildly inflamed internal hemorrhoidal plexus without prolapse.   Labs:  He brought some recent labs from primary care showing normal TSH, normal CBC,  and normal CMP except glucose 117.  Encounter Diagnoses  Name Primary?   Acute constipation Yes   Anal pain     Assessment and Plan    Anal discomfort Recent history of constipation with painful bowel movements, followed by persistent anal itching and discomfort. No reported rectal bleeding. Likely secondary to recent dietary indiscretions over the holidays. No fissure.  Mild inflammation internal hemorrhoidal plexus found on exam, no bleeding. -Continue stool softeners and sitz baths as currently doing.  Calmol suppository samples given to use for the next few days.  I suspect this will resolve on its own.  Maintain regularity with high-fiber diet and as needed use of stool softeners.  Can also add a magnesium supplement in the evening if needed.  Up-to-date on colorectal cancer screening with a 2019 colonoscopy (noted above).  Based on current guidelines, no current or future colorectal cancer screening tests recommended.  Thank you for the courtesy of this consult.  Please call me with any questions or concerns.  Victory LITTIE Brand III  CC: Referring provider noted above

## 2023-05-11 ENCOUNTER — Telehealth: Payer: Self-pay | Admitting: Gastroenterology

## 2023-05-11 ENCOUNTER — Ambulatory Visit (INDEPENDENT_AMBULATORY_CARE_PROVIDER_SITE_OTHER): Payer: Medicare Other | Admitting: *Deleted

## 2023-05-11 DIAGNOSIS — J309 Allergic rhinitis, unspecified: Secondary | ICD-10-CM

## 2023-05-11 NOTE — Telephone Encounter (Signed)
Lm on vm for patient to return call.  Per 04/28/23 patient message - Dr. Myrtie Neither does not think that patient needs cologuard testing

## 2023-05-11 NOTE — Telephone Encounter (Signed)
Patient is requesting that Dr. Myrtie Neither order him a stool card kit?

## 2023-05-13 NOTE — Telephone Encounter (Signed)
2nd attempt to reach patient. Phone goes straight to voicemail. I left patient a detailed vm letting him no that Dr. Myrtie Neither did not think that he needed cologuard testing. I advised pt to call back if he has any questions.

## 2023-05-24 ENCOUNTER — Ambulatory Visit (INDEPENDENT_AMBULATORY_CARE_PROVIDER_SITE_OTHER): Payer: Self-pay | Admitting: *Deleted

## 2023-05-24 DIAGNOSIS — J309 Allergic rhinitis, unspecified: Secondary | ICD-10-CM

## 2023-05-27 ENCOUNTER — Other Ambulatory Visit (INDEPENDENT_AMBULATORY_CARE_PROVIDER_SITE_OTHER): Payer: Self-pay | Admitting: Otolaryngology

## 2023-05-27 ENCOUNTER — Other Ambulatory Visit (HOSPITAL_COMMUNITY): Payer: Self-pay

## 2023-05-27 MED ORDER — AZITHROMYCIN 500 MG PO TABS
500.0000 mg | ORAL_TABLET | Freq: Every day | ORAL | 0 refills | Status: AC
  Filled 2023-05-27: qty 3, 3d supply, fill #0

## 2023-06-11 ENCOUNTER — Other Ambulatory Visit (HOSPITAL_COMMUNITY): Payer: Self-pay

## 2023-06-11 MED ORDER — CEFUROXIME AXETIL 500 MG PO TABS
500.0000 mg | ORAL_TABLET | Freq: Two times a day (BID) | ORAL | 0 refills | Status: DC
  Filled 2023-06-11: qty 30, 15d supply, fill #0

## 2023-06-16 ENCOUNTER — Ambulatory Visit (INDEPENDENT_AMBULATORY_CARE_PROVIDER_SITE_OTHER): Payer: Medicare Other | Admitting: *Deleted

## 2023-06-16 DIAGNOSIS — J309 Allergic rhinitis, unspecified: Secondary | ICD-10-CM

## 2023-06-22 ENCOUNTER — Ambulatory Visit (INDEPENDENT_AMBULATORY_CARE_PROVIDER_SITE_OTHER): Payer: Self-pay

## 2023-06-22 DIAGNOSIS — J309 Allergic rhinitis, unspecified: Secondary | ICD-10-CM | POA: Diagnosis not present

## 2023-07-05 ENCOUNTER — Other Ambulatory Visit (HOSPITAL_COMMUNITY): Payer: Self-pay

## 2023-07-05 MED ORDER — TAMSULOSIN HCL 0.4 MG PO CAPS
0.4000 mg | ORAL_CAPSULE | Freq: Two times a day (BID) | ORAL | 3 refills | Status: DC
  Filled 2023-07-05 (×2): qty 180, 90d supply, fill #0
  Filled 2023-09-28: qty 180, 90d supply, fill #1

## 2023-07-06 ENCOUNTER — Other Ambulatory Visit: Payer: Self-pay

## 2023-07-21 ENCOUNTER — Ambulatory Visit (INDEPENDENT_AMBULATORY_CARE_PROVIDER_SITE_OTHER): Payer: Self-pay | Admitting: *Deleted

## 2023-07-21 DIAGNOSIS — J309 Allergic rhinitis, unspecified: Secondary | ICD-10-CM

## 2023-08-09 NOTE — Progress Notes (Unsigned)
 Cardiology Office Note:  .   Date:  08/10/2023  ID:  Harry [name], DOB 1940-07-11, MRN [id number] PCP: Aldo Hun, MD  [facility] Providers Cardiologist:  Oneil Bigness, MD { History of Present Illness: .    Chief Complaint  Patient presents with   Follow-up    Harry [name] is a 83 y.o. male with history of PACs, coronary calcium  who presents for follow-up.   History of Present Illness   Harry [name] is an 83 year old male with PACs and minimal CAD who presents for follow-up.  He has not experienced any recent episodes of heart racing, which he had in the past. No chest pain or trouble breathing. Previous CT scan showed no blockages, and he was considered low risk for coronary artery disease. Aortic aneurysm scan was also clear.  Approximately six months ago, he had COVID-19, resulting in a persistent cough lasting about six weeks. He remains active, playing golf regularly and engaging in exercise, which has improved his golf performance, particularly off the tee.  He takes allergy  shots regularly and plans to get one after the visit. He is on Tamsulosin  (Flomax ) for nocturia, as he gets up at night to void. He does not take statins due to concerns about side effects, despite having hyperlipidemia.  His wife has a history of PACs and PAT and is currently on a monitor. She is trying to get an appointment with her cardiologist, Dr. Peter Swaziland.          Problem List Coronary calcium  -CAC 72 (21st percentile) -minimal CAD (<25%)  2. HLD -T chol 206, HDL 62, LDL 133, TG 56 3. PACs -5.5% burden    ROS: All other ROS reviewed and negative. Pertinent positives noted in the HPI.     Studies Reviewed: Harry [name]   EKG Interpretation Date/Time:  Tuesday August 10 2023 13:13:12 EDT Ventricular Rate:  66 PR Interval:  214 QRS Duration:  96 QT Interval:  398 QTC Calculation: 417 R Axis:   63  Text Interpretation: Sinus rhythm with 1st degree A-V block with  Premature atrial complexes Low voltage QRS Confirmed by Harry [name] [phone]) on 08/10/2023 1:15:03 PM   Physical Exam:   VS:  BP 114/70   Pulse 77   Ht 6\' 3"  (1.905 m)   Wt 207 lb 8 oz (94.1 kg)   SpO2 97%   BMI 25.94 kg/m    Wt Readings from Last 3 Encounters:  08/10/23 207 lb 8 oz (94.1 kg)  04/28/23 206 lb 3.2 oz (93.5 kg)  03/25/23 201 lb (91.2 kg)    GEN: Well nourished, well developed in no acute distress NECK: No JVD; No carotid bruits CARDIAC: RRR, no murmurs, rubs, gallops RESPIRATORY:  Clear to auscultation without rales, wheezing or rhonchi  ABDOMEN: Soft, non-tender, non-distended EXTREMITIES:  No edema; No deformity  ASSESSMENT AND PLAN: .   Assessment and Plan    Minimal coronary artery disease (CAD) Minimal CAD with very low coronary calcium  score, asymptomatic, low risk. - Follow up in one year. - OK to hold statin. No need for ASA.   Hyperlipidemia Hyperlipidemia with very low coronary calcium  and minimal CAD, asymptomatic, not on statin therapy due to side effect concerns. - Follow up in one year.  Premature atrial contractions (PACs) PACs on EKG, asymptomatic, no recent palpitations, no treatment needed. - Follow up in one year.              Follow-up: Return in  about 1 year (around 08/09/2024).   Signed, Harry [name]. Rolm Clos, MD, [facility]  [facility]  [address], [address] [place], [place] [address] [phone]  1:27 PM

## 2023-08-10 ENCOUNTER — Ambulatory Visit (INDEPENDENT_AMBULATORY_CARE_PROVIDER_SITE_OTHER)

## 2023-08-10 ENCOUNTER — Ambulatory Visit (INDEPENDENT_AMBULATORY_CARE_PROVIDER_SITE_OTHER): Admitting: Cardiovascular Disease

## 2023-08-10 ENCOUNTER — Encounter (HOSPITAL_BASED_OUTPATIENT_CLINIC_OR_DEPARTMENT_OTHER): Payer: Self-pay | Admitting: Cardiovascular Disease

## 2023-08-10 VITALS — BP 114/70 | HR 77 | Ht 75.0 in | Wt 207.5 lb

## 2023-08-10 DIAGNOSIS — J309 Allergic rhinitis, unspecified: Secondary | ICD-10-CM

## 2023-08-10 DIAGNOSIS — E782 Mixed hyperlipidemia: Secondary | ICD-10-CM | POA: Diagnosis not present

## 2023-08-10 DIAGNOSIS — R931 Abnormal findings on diagnostic imaging of heart and coronary circulation: Secondary | ICD-10-CM | POA: Diagnosis not present

## 2023-08-10 DIAGNOSIS — I491 Atrial premature depolarization: Secondary | ICD-10-CM | POA: Diagnosis not present

## 2023-08-10 NOTE — Patient Instructions (Signed)
 Medication Instructions:  Your physician recommends that you continue on your current medications as directed. Please refer to the Current Medication list given to you today.  *If you need a refill on your cardiac medications before your next appointment, please call your pharmacy*  Follow-Up: At [facility], you and your health needs are our priority.  As part of our continuing mission to provide you with exceptional heart care, we have created designated Provider Care Teams.  These Care Teams include your primary Cardiologist (physician) and Advanced Practice Providers (APPs -  Physician Assistants and Nurse Practitioners) who all work together to provide you with the care you need, when you need it.  We recommend signing up for the patient portal called "MyChart".  Sign up information is provided on this After Visit Summary.  MyChart is used to connect with patients for Virtual Visits (Telemedicine).  Patients are able to view lab/test results, encounter notes, upcoming appointments, etc.  Non-urgent messages can be sent to your provider as well.   To learn more about what you can do with MyChart, go to [email or web address].    Your next appointment:   12 months  Provider:   Dr. Rolm Clos or APP

## 2023-08-26 ENCOUNTER — Ambulatory Visit (INDEPENDENT_AMBULATORY_CARE_PROVIDER_SITE_OTHER): Payer: Self-pay

## 2023-08-26 DIAGNOSIS — J309 Allergic rhinitis, unspecified: Secondary | ICD-10-CM

## 2023-09-03 ENCOUNTER — Encounter (INDEPENDENT_AMBULATORY_CARE_PROVIDER_SITE_OTHER): Payer: Self-pay | Admitting: Otolaryngology

## 2023-09-03 ENCOUNTER — Ambulatory Visit (INDEPENDENT_AMBULATORY_CARE_PROVIDER_SITE_OTHER): Admitting: Otolaryngology

## 2023-09-03 VITALS — BP 126/69 | HR 48 | Ht 75.0 in | Wt 202.0 lb

## 2023-09-03 DIAGNOSIS — H6123 Impacted cerumen, bilateral: Secondary | ICD-10-CM | POA: Diagnosis not present

## 2023-09-05 DIAGNOSIS — H6123 Impacted cerumen, bilateral: Secondary | ICD-10-CM | POA: Insufficient documentation

## 2023-09-05 NOTE — Progress Notes (Signed)
 Patient ID: Harry [name], male   DOB: 06/16/1940, 83 y.o.   MRN: [id number]  Procedure: Bilateral cerumen disimpaction.   Indication: Cerumen impaction, resulting in ear discomfort and conductive hearing loss.   Description: The patient is placed supine on the operating table. Under the operating microscope, the right ear canal is examined and is noted to be impacted with cerumen. The cerumen is carefully removed with a combination of suction catheters, cerumen curette, and alligator forceps. After the cerumen removal, right ear canal osteomas are noted.  No middle ear effusion is noted. The same procedure is then repeated on the left side without exception. The patient tolerated the procedure well.  Follow-up care:  The patient is instructed not to use Q-tips to clean the ear canals. The patient will follow up as needed.

## 2023-09-10 ENCOUNTER — Ambulatory Visit (INDEPENDENT_AMBULATORY_CARE_PROVIDER_SITE_OTHER): Admitting: Otolaryngology

## 2023-09-20 ENCOUNTER — Ambulatory Visit (INDEPENDENT_AMBULATORY_CARE_PROVIDER_SITE_OTHER)

## 2023-09-20 DIAGNOSIS — J309 Allergic rhinitis, unspecified: Secondary | ICD-10-CM

## 2023-09-28 ENCOUNTER — Ambulatory Visit (INDEPENDENT_AMBULATORY_CARE_PROVIDER_SITE_OTHER): Payer: Self-pay

## 2023-09-28 ENCOUNTER — Other Ambulatory Visit (HOSPITAL_COMMUNITY): Payer: Self-pay

## 2023-09-28 DIAGNOSIS — J309 Allergic rhinitis, unspecified: Secondary | ICD-10-CM

## 2023-10-05 ENCOUNTER — Ambulatory Visit (INDEPENDENT_AMBULATORY_CARE_PROVIDER_SITE_OTHER): Admitting: Otolaryngology

## 2023-10-05 ENCOUNTER — Ambulatory Visit (INDEPENDENT_AMBULATORY_CARE_PROVIDER_SITE_OTHER): Admitting: Audiology

## 2024-01-17 ENCOUNTER — Ambulatory Visit (INDEPENDENT_AMBULATORY_CARE_PROVIDER_SITE_OTHER)

## 2024-01-17 ENCOUNTER — Other Ambulatory Visit (HOSPITAL_COMMUNITY): Payer: Self-pay

## 2024-01-17 DIAGNOSIS — J309 Allergic rhinitis, unspecified: Secondary | ICD-10-CM | POA: Diagnosis not present

## 2024-01-17 MED ORDER — PREDNISONE 10 MG PO TABS
10.0000 mg | ORAL_TABLET | Freq: Three times a day (TID) | ORAL | 1 refills | Status: DC
  Filled 2024-01-17: qty 60, 20d supply, fill #0
  Filled 2024-02-03: qty 60, 20d supply, fill #1

## 2024-01-18 ENCOUNTER — Other Ambulatory Visit (HOSPITAL_COMMUNITY): Payer: Self-pay

## 2024-01-24 ENCOUNTER — Telehealth: Payer: Self-pay | Admitting: Gastroenterology

## 2024-01-24 NOTE — Telephone Encounter (Signed)
 Patient having complication with constipation. Requesting to speak with a nurse.   Also requesting sooner apt. Please advise.   Thank you

## 2024-01-24 NOTE — Telephone Encounter (Signed)
 Attempted to reach patient. No answer, left VM for patient to return call.

## 2024-01-24 NOTE — Telephone Encounter (Signed)
 Inbound call from patient stating he is scheduled for an office visit on 03/14/24 but would like to know if nurse has something sooner. Patient is having right side pain whenever he goes to bathroom and he believes its not ED serious but he cannot wait until November to be seen. Please advise  Thank you

## 2024-01-24 NOTE — Telephone Encounter (Signed)
 Two encounters opened for this pt. Documentation in the other encounter. Closing this one.

## 2024-01-25 NOTE — Telephone Encounter (Signed)
 Attempted to reach patient. No answer, left VM for patient to return call.

## 2024-01-26 ENCOUNTER — Ambulatory Visit

## 2024-01-26 DIAGNOSIS — J309 Allergic rhinitis, unspecified: Secondary | ICD-10-CM | POA: Diagnosis not present

## 2024-01-26 NOTE — Telephone Encounter (Signed)
 3rd attempt to reach pt. No answer, left vm for pt to return call if he still requires assistance.

## 2024-01-26 NOTE — Progress Notes (Signed)
 VIALS MADE 01-26-24

## 2024-01-27 DIAGNOSIS — J301 Allergic rhinitis due to pollen: Secondary | ICD-10-CM | POA: Diagnosis not present

## 2024-01-27 DIAGNOSIS — J3081 Allergic rhinitis due to animal (cat) (dog) hair and dander: Secondary | ICD-10-CM | POA: Diagnosis not present

## 2024-01-28 DIAGNOSIS — J3089 Other allergic rhinitis: Secondary | ICD-10-CM | POA: Diagnosis not present

## 2024-01-28 DIAGNOSIS — J301 Allergic rhinitis due to pollen: Secondary | ICD-10-CM | POA: Diagnosis not present

## 2024-02-03 ENCOUNTER — Other Ambulatory Visit (HOSPITAL_COMMUNITY): Payer: Self-pay

## 2024-02-03 ENCOUNTER — Ambulatory Visit (INDEPENDENT_AMBULATORY_CARE_PROVIDER_SITE_OTHER)

## 2024-02-03 DIAGNOSIS — J309 Allergic rhinitis, unspecified: Secondary | ICD-10-CM

## 2024-02-14 ENCOUNTER — Telehealth (INDEPENDENT_AMBULATORY_CARE_PROVIDER_SITE_OTHER): Payer: Self-pay | Admitting: Otolaryngology

## 2024-02-14 NOTE — Telephone Encounter (Signed)
 The patient called in, he needs to reschedule his appointments tomorrow, he will be out of town.  He wanted to see if Dr Karis would be ok to see him at a later date without the audio first? Please advise.  I  let him know that Dr Karis will likely see this note toward the end of the day.

## 2024-02-15 ENCOUNTER — Ambulatory Visit (INDEPENDENT_AMBULATORY_CARE_PROVIDER_SITE_OTHER): Admitting: Otolaryngology

## 2024-02-15 ENCOUNTER — Ambulatory Visit (INDEPENDENT_AMBULATORY_CARE_PROVIDER_SITE_OTHER): Admitting: Audiology

## 2024-02-22 ENCOUNTER — Ambulatory Visit (INDEPENDENT_AMBULATORY_CARE_PROVIDER_SITE_OTHER): Admitting: Otolaryngology

## 2024-02-22 VITALS — BP 104/65 | HR 74

## 2024-02-22 DIAGNOSIS — Z91030 Bee allergy status: Secondary | ICD-10-CM | POA: Diagnosis not present

## 2024-02-22 DIAGNOSIS — H61811 Exostosis of right external canal: Secondary | ICD-10-CM | POA: Diagnosis not present

## 2024-02-22 DIAGNOSIS — H6123 Impacted cerumen, bilateral: Secondary | ICD-10-CM | POA: Diagnosis not present

## 2024-02-22 NOTE — Progress Notes (Signed)
 Patient ID: Harry [name], male   DOB: [birth year], 83 y.o.   MRN: [id number]  Follow up: Right ear canal exostosis, bee allergy , cerumen impaction  Discussed the use of AI scribe software for clinical note transcription with the patient, who gave verbal consent to proceed.  History of Present Illness Harry [name] is an 83 year old male who presents for evaluation of ear health and concerns about hearing loss.  He is concerned about his exostosis and is careful to avoid water  in his affected ear. No pain is associated with the exostosis. He has a history of swimming in cold water , which may have contributed to the development of the exostosis. He prefers having his ears cleaned by a provider due to the complexity of his condition.  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  He has a bee allergy  for which he was desensitized years ago and carries an Epipen . He is interested in learning about new treatments such as an epinephrine  nasal spray. He also has food allergies to apples, peaches, and pears, which cause throat discomfort. He continues to receive allergy  shots from his allergist.    Exam: General: Communicates without difficulty, well nourished, no acute distress. Head: Normocephalic, no evidence injury, no tenderness, facial buttresses intact without stepoff. Face/sinus: No tenderness to palpation and percussion. Facial movement is normal and symmetric. Eyes: PERRL, EOMI. No scleral icterus, conjunctivae clear. Neuro: CN II exam reveals vision grossly intact.  No nystagmus at any point of gaze. Ears: Auricles well formed without lesions.  Bilateral cerumen impaction.  Nose: External evaluation reveals normal support and skin without lesions.  Dorsum is intact.  Anterior rhinoscopy reveals congested mucosa over anterior aspect of inferior turbinates and intact septum.  No purulence noted. Oral:  Oral cavity and  oropharynx are intact, symmetric, without erythema or edema.  Mucosa is moist without lesions. Neck: Full range of motion without pain.  There is no significant lymphadenopathy.  No masses palpable.  Thyroid bed within normal limits to palpation.  Parotid glands and submandibular glands equal bilaterally without mass.  Trachea is midline. Neuro:  CN 2-12 grossly intact.   Procedure: Bilateral cerumen disimpaction Anesthesia: None Description: Under the operating microscope, the cerumen is carefully removed with a combination of cerumen currette, alligator forceps, and suction catheters.  After the cerumen is removed, severe bony exostosis is noted within the right ear canal.  Both tympanic membranes are normal.  No mass, erythema, or lesions. The patient tolerated the procedure well.    Assessment and Plan Assessment & Plan Exostosis of external ear canal Significant exostosis in the right external ear canal, likely due to prolonged cold water  exposure. No pain or infection. Surgery not recommended due to age and potential complications. - Continuing conservative observation.  Bee and food allergies. - Use of EpiPen  as needed.  The patient will follow-up with his allergist next week.  Cerumen impaction Cerumen impaction present. He is cautious about water  exposure to prevent cerumen buildup. - Continue current management and avoid water  exposure.

## 2024-02-23 ENCOUNTER — Ambulatory Visit

## 2024-02-23 ENCOUNTER — Telehealth: Payer: Self-pay | Admitting: Allergy and Immunology

## 2024-02-23 DIAGNOSIS — J309 Allergic rhinitis, unspecified: Secondary | ICD-10-CM

## 2024-02-23 NOTE — Telephone Encounter (Signed)
 Harry [name] came into the office and states he would like to try Neffy .  He said if it is able to be picked up at a regular pharmacy he is ok with that too.

## 2024-02-29 ENCOUNTER — Other Ambulatory Visit: Payer: Self-pay

## 2024-02-29 ENCOUNTER — Ambulatory Visit: Admitting: Internal Medicine

## 2024-02-29 ENCOUNTER — Encounter: Payer: Self-pay | Admitting: Internal Medicine

## 2024-02-29 VITALS — BP 122/74 | HR 56 | Temp 98.1°F | Ht 75.0 in | Wt 202.9 lb

## 2024-02-29 DIAGNOSIS — J302 Other seasonal allergic rhinitis: Secondary | ICD-10-CM

## 2024-02-29 DIAGNOSIS — T6391XD Toxic effect of contact with unspecified venomous animal, accidental (unintentional), subsequent encounter: Secondary | ICD-10-CM

## 2024-02-29 DIAGNOSIS — J3089 Other allergic rhinitis: Secondary | ICD-10-CM

## 2024-02-29 MED ORDER — NEFFY 2 MG/0.1ML NA SOLN: 2.0000 mg | NASAL | 1 refills | Status: DC | PRN

## 2024-02-29 NOTE — Progress Notes (Signed)
   FOLLOW UP Date of Service/Encounter:  02/29/24   Subjective:  Harry [name] (DOB: Jun 09, 1940) is a 83 y.o. male who [facility] for follow up for allergic rhinitis.   History obtained from: chart review and patient. Last seen 03/31/2022 with dr Kozlow for cough, given air supra to use PRN.   Reports allergies are doing better with shots but still with some sneezing, itchy watery eyes. Using anti histamine eye drops OTC PRN.  He is happy with allergy  shots and wishes to continue; new Rx was done in 2022 with Dr Maurilio.  No issues with reactions.   Notes no issues with cough/wheeze/dyspnea or inhaler use in many years.   Notes hx of venom anaphylaxis, underwent venom shots.  He still would like access to Epinephrine  and would like the new Neffy .   Past Medical History: Past Medical History:  Diagnosis Date   Allergic rhinitis    Allergy     Asthma    50 years ago   Giardia     Objective:  BP 122/74 (BP Location: Left Arm, Patient Position: Sitting, Cuff Size: Normal)   Pulse (!) 56   Temp 98.1 F (36.7 C) (Temporal)   Ht 6' 3 (1.905 m)   Wt 202 lb 14.4 oz (92 kg)   SpO2 97%   BMI 25.36 kg/m  Body mass index is 25.36 kg/m. Physical Exam: GEN: alert, well developed HEENT: clear conjunctiva, nose without inferior turbinate hypertrophy, pink nasal mucosa, no rhinorrhea, no cobblestoning HEART: regular rate and rhythm, no murmur LUNGS: clear to auscultation bilaterally, no coughing, unlabored respiration SKIN: no rashes or lesions  Assessment:   1. Seasonal and perennial allergic rhinitis   2. Venom-induced anaphylaxis, accidental or unintentional, subsequent encounter     Plan/Recommendations:  Allergic Rhinitis: - Improved, continue PRN medications and AIT.  - Use nasal saline rinses before nose sprays such as with Neilmed Sinus Rinse.  Use distilled water .   - Use Zyrtec 10 mg daily as needed for runny nose,  sneezing, itchy watery eyes.   - For eyes, use Olopatadine  or Ketotifen 1 eye drop daily as needed for itchy, watery eyes.  Available over the counter, if not covered by insurance.  - Continue allergy  shots.  Keep Neffy  or Epipen .  Stinging Insect Allergy : - Of note, he reports undergoing VIT in the past.  Discussed this should result in a protective state but I am happy to send in Neffy /Epipen  if it helps him feel comfortable.  - please carry Epinephrine  autoinjector or Neffy  at all times in case of accidental sting, to be used if stung and has SKIN AND ANY OTHER SYMPTOMS - for SKIN only, can take Benadryl  2 tsp (25 mg)    Return in about 1 year (around 02/28/2025).  Arleta Blanch, MD [facility]

## 2024-02-29 NOTE — Patient Instructions (Addendum)
 Allergic Rhinitis: - Use nasal saline rinses before nose sprays such as with Neilmed Sinus Rinse.  Use distilled water .   - Use Zyrtec 10 mg daily as needed for runny nose, sneezing, itchy watery eyes.   - For eyes, use Olopatadine  or Ketotifen 1 eye drop daily as needed for itchy, watery eyes.  Available over the counter, if not covered by insurance.  - Continue allergy  shots.  Keep Neffy  or Epipen .  Stinging Insect Allergy : - please carry Epinephrine  autoinjector or Neffy  at all times in case of accidental sting, to be used if stung and has SKIN AND ANY OTHER SYMPTOMS - for SKIN only, can take Benadryl  2 tsp (25 mg)

## 2024-03-01 ENCOUNTER — Telehealth: Payer: Self-pay

## 2024-03-01 NOTE — Telephone Encounter (Signed)
*  AA  Pharmacy Patient Advocate Encounter   Received notification from CoverMyMeds that prior authorization for Neffy  2MG /0.1ML solution  is required/requested.   Insurance verification completed.   The patient is insured through [facility].   Per test claim:  Injectable Epinephrine  is preferred by the insurance.  If suggested medication is appropriate, Please send in a new RX and discontinue this one. If not, please advise as to why it's not appropriate so that we may request a Prior Authorization. Please note, some preferred medications may still require a PA.  If the suggested medications have not been trialed and there are no contraindications to their use, the PA will not be submitted, as it will not be approved.   CMM Key: [id number]

## 2024-03-01 NOTE — Telephone Encounter (Signed)
 For Blink: If Neffy  isn't covered or the covered Neffy  is more than $199 (high-deductible insurance) the patient will be offered the cash pay price of $199/box regardless.  If a prior authorization is required per BlinkRx, instruct the patient to go to [email or web address]  You or your staff will not need to complete the PA.  The patient will do a brief consultation with a virtual prescriber, and the PA will be done for the patient.

## 2024-03-02 ENCOUNTER — Other Ambulatory Visit: Payer: Self-pay

## 2024-03-02 ENCOUNTER — Telehealth: Payer: Self-pay | Admitting: Internal Medicine

## 2024-03-08 ENCOUNTER — Other Ambulatory Visit (HOSPITAL_COMMUNITY): Payer: Self-pay

## 2024-03-08 NOTE — Telephone Encounter (Signed)
 Garey would like Neffy 

## 2024-03-08 NOTE — Telephone Encounter (Signed)
 Harry [name] would like Neffy  sent to the [facility].

## 2024-03-09 ENCOUNTER — Other Ambulatory Visit: Payer: Self-pay

## 2024-03-09 ENCOUNTER — Other Ambulatory Visit (HOSPITAL_COMMUNITY): Payer: Self-pay

## 2024-03-09 ENCOUNTER — Encounter (HOSPITAL_COMMUNITY): Payer: Self-pay

## 2024-03-09 ENCOUNTER — Telehealth (HOSPITAL_COMMUNITY): Payer: Self-pay

## 2024-03-09 MED ORDER — NEFFY 2 MG/0.1ML NA SOLN
2.0000 mg | NASAL | 1 refills | Status: AC | PRN
  Filled 2024-03-09 – 2024-03-20 (×2): qty 4, 30d supply, fill #0

## 2024-03-09 NOTE — Telephone Encounter (Signed)
 I called and spoke with patient to inform him that it was sent in to the [facility] outpatient pharmacy. He thanked and will pick it up when back in town.

## 2024-03-14 ENCOUNTER — Ambulatory Visit: Admitting: Gastroenterology

## 2024-03-20 ENCOUNTER — Other Ambulatory Visit (HOSPITAL_COMMUNITY): Payer: Self-pay

## 2024-03-21 ENCOUNTER — Other Ambulatory Visit (HOSPITAL_COMMUNITY): Payer: Self-pay

## 2024-03-21 MED ORDER — TAMSULOSIN HCL 0.4 MG PO CAPS
0.4000 mg | ORAL_CAPSULE | Freq: Every day | ORAL | 3 refills | Status: AC
  Filled 2024-03-21: qty 90, 90d supply, fill #0
  Filled 2024-05-05: qty 60, 60d supply, fill #1

## 2024-03-29 NOTE — Telephone Encounter (Signed)
 Error

## 2024-03-30 ENCOUNTER — Ambulatory Visit (INDEPENDENT_AMBULATORY_CARE_PROVIDER_SITE_OTHER)

## 2024-03-30 DIAGNOSIS — J3089 Other allergic rhinitis: Secondary | ICD-10-CM

## 2024-03-30 DIAGNOSIS — J302 Other seasonal allergic rhinitis: Secondary | ICD-10-CM | POA: Diagnosis not present

## 2024-03-31 ENCOUNTER — Encounter (INDEPENDENT_AMBULATORY_CARE_PROVIDER_SITE_OTHER): Payer: Self-pay | Admitting: Otolaryngology

## 2024-03-31 ENCOUNTER — Ambulatory Visit (INDEPENDENT_AMBULATORY_CARE_PROVIDER_SITE_OTHER): Admitting: Otolaryngology

## 2024-03-31 VITALS — BP 133/71 | HR 62 | Ht 75.0 in | Wt 198.0 lb

## 2024-03-31 DIAGNOSIS — H61811 Exostosis of right external canal: Secondary | ICD-10-CM

## 2024-03-31 DIAGNOSIS — T162XXA Foreign body in left ear, initial encounter: Secondary | ICD-10-CM | POA: Insufficient documentation
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
## 2024-04-06 ENCOUNTER — Ambulatory Visit (INDEPENDENT_AMBULATORY_CARE_PROVIDER_SITE_OTHER)

## 2024-04-06 DIAGNOSIS — J309 Allergic rhinitis, unspecified: Secondary | ICD-10-CM

## 2024-04-06 DIAGNOSIS — J302 Other seasonal allergic rhinitis: Secondary | ICD-10-CM | POA: Diagnosis not present

## 2024-04-09 ENCOUNTER — Telehealth: Payer: Self-pay

## 2024-04-09 NOTE — Telephone Encounter (Signed)
"  ° °  Received a page that patient is in a ER.   Patient confirmed NOT IN THE ER. He called the line as he was concerned over an irregular heart beat he has noticed recently, different than his previous palpitations. Also noting new exercise intolerance which is not normal for him. Noting HR elevations with little exertion. He was requesting outpatient follow up with Dr. Barbaraann. Patient denied chest pain.   Appointment scheduled 12/23 with Dr. Barbaraann. Advised patient to take it easy over the next few days and to present to the ED if he develops chest pain.  Caller verbalized understanding and was grateful for the call back.  Leontine LOISE Salen, PA-C 04/09/2024, 1:20 PM   "

## 2024-04-11 ENCOUNTER — Ambulatory Visit
Admission: RE | Admit: 2024-04-11 | Discharge: 2024-04-11 | Disposition: A | Discharge status: Home / Self Care | Source: Ambulatory Visit | Attending: Cardiovascular Disease | Admitting: Cardiovascular Disease

## 2024-04-11 ENCOUNTER — Encounter: Payer: Self-pay | Admitting: Cardiovascular Disease

## 2024-04-11 ENCOUNTER — Other Ambulatory Visit (HOSPITAL_COMMUNITY): Payer: Self-pay

## 2024-04-11 ENCOUNTER — Ambulatory Visit

## 2024-04-11 ENCOUNTER — Ambulatory Visit: Admitting: Cardiovascular Disease

## 2024-04-11 VITALS — BP 130/60 | HR 69 | Ht 75.0 in | Wt 204.0 lb

## 2024-04-11 DIAGNOSIS — R002 Palpitations: Secondary | ICD-10-CM | POA: Insufficient documentation

## 2024-04-11 DIAGNOSIS — R9431 Abnormal electrocardiogram [ECG] [EKG]: Secondary | ICD-10-CM | POA: Insufficient documentation

## 2024-04-11 DIAGNOSIS — I499 Cardiac arrhythmia, unspecified: Secondary | ICD-10-CM | POA: Insufficient documentation

## 2024-04-11 DIAGNOSIS — I491 Atrial premature depolarization: Secondary | ICD-10-CM

## 2024-04-11 DIAGNOSIS — I349 Nonrheumatic mitral valve disorder, unspecified: Secondary | ICD-10-CM | POA: Diagnosis not present

## 2024-04-11 DIAGNOSIS — E782 Mixed hyperlipidemia: Secondary | ICD-10-CM | POA: Insufficient documentation

## 2024-04-11 LAB — CBC

## 2024-04-11 MED ORDER — METOPROLOL SUCCINATE ER 25 MG PO TB24
12.5000 mg | ORAL_TABLET | Freq: Every day | ORAL | 1 refills | Status: DC
  Filled 2024-04-11: qty 45, 90d supply, fill #0

## 2024-04-11 NOTE — Progress Notes (Unsigned)
 Enrolled patient for a 14 day Zio XT  monitor to be mailed to patients home

## 2024-04-11 NOTE — Patient Instructions (Signed)
 Medication Instructions:  Your physician has recommended you make the following change in your medication:   -Start taking metoprolol  succinate (Toprol - XL) 12.5mg  once daily.  *If you need a refill on your cardiac medications before your next appointment, please call your pharmacy*  Lab Work: Today- BMET, TSH, CBC  If you have labs (blood work) drawn today and your tests are completely normal, you will receive your results only by: MyChart Message (if you have MyChart) OR A paper copy in the mail If you have any lab test that is abnormal or we need to change your treatment, we will call you to review the results.  Testing/Procedures: Your physician has requested that you have an echocardiogram. Echocardiography is a painless test that uses sound waves to create images of your heart. It provides your doctor with information about the size and shape of your heart and how well your hearts chambers and valves are working. This procedure takes approximately one hour. There are no restrictions for this procedure. Please do NOT wear cologne, perfume, aftershave, or lotions (deodorant is allowed). Please arrive 15 minutes prior to your appointment time.  Please note: We ask at that you not bring children with you during ultrasound (echo/ vascular) testing. Due to room size and safety concerns, children are not allowed in the ultrasound rooms during exams. Our front office staff cannot provide observation of children in our lobby area while testing is being conducted. An adult accompanying a patient to their appointment will only be allowed in the ultrasound room at the discretion of the ultrasound technician under special circumstances. We apologize for any inconvenience.   ZIO XT- Long Term Monitor Instructions  Your physician has requested you wear a ZIO patch monitor for 14 days.  This is a single patch monitor. [employer] supplies one patch monitor per enrollment. Additional stickers are not  available. Please do not apply patch if you will be having a Nuclear Stress Test,  Echocardiogram, Cardiac CT, MRI, or Chest Xray during the period you would be wearing the  monitor. The patch cannot be worn during these tests. You cannot remove and re-apply the  ZIO XT patch monitor.  Your ZIO patch monitor will be mailed 3 day USPS to your address on file. It may take 3-5 days  to receive your monitor after you have been enrolled.  Once you have received your monitor, please review the enclosed instructions. Your monitor  has already been registered assigning a specific monitor serial # to you.  Billing and Patient Assistance Program Information  We have supplied [employer] with any of your insurance information on file for billing purposes. [employer] offers a sliding scale Patient Assistance Program for patients that do not have  insurance, or whose insurance does not completely cover the cost of the ZIO monitor.  You must apply for the Patient Assistance Program to qualify for this discounted rate.  To apply, please call [employer] at [phone], select option 4, select option 2, ask to apply for  Patient Assistance Program. Meredeth will ask your household income, and how many people  are in your household. They will quote your out-of-pocket cost based on that information.  [employer] will also be able to set up a 63-month, interest-free payment plan if needed.  Applying the monitor   Shave hair from upper left chest.  Hold abrader disc by orange tab. Rub abrader in 40 strokes over the upper left chest as  indicated in your monitor instructions.  Clean area with 4  enclosed alcohol  pads. Let dry.  Apply patch as indicated in monitor instructions. Patch will be placed under collarbone on left  side of chest with arrow pointing upward.  Rub patch adhesive wings for 2 minutes. Remove white label marked 1. Remove the white  label marked 2. Rub patch adhesive wings for 2 additional minutes.   While looking in a mirror, press and release button in center of patch. A small green light will  flash 3-4 times. This will be your only indicator that the monitor has been turned on.  Do not shower for the first 24 hours. You may shower after the first 24 hours.  Press the button if you feel a symptom. You will hear a small click. Record Date, Time and  Symptom in the Patient Logbook.  When you are ready to remove the patch, follow instructions on the last 2 pages of Patient  Logbook. Stick patch monitor onto the last page of Patient Logbook.  Place Patient Logbook in the blue and white box. Use locking tab on box and tape box closed  securely. The blue and white box has prepaid postage on it. Please place it in the mailbox as  soon as possible. Your physician should have your test results approximately 7 days after the  monitor has been mailed back to [facility].  Call [facility] Customer Care at [phone] if you have questions regarding  your ZIO XT patch monitor. Call them immediately if you see an orange light blinking on your  monitor.  If your monitor falls off in less than 4 days, contact our Monitor department at [phone].  If your monitor becomes loose or falls off after 4 days call [employer] at [phone] for  suggestions on securing your monitor   Follow-Up: At [facility], you and your health needs are our priority.  As part of our continuing mission to provide you with exceptional heart care, our providers are all part of one team.  This team includes your primary Cardiologist (physician) and Advanced Practice Providers or APPs (Physician Assistants and Nurse Practitioners) who all work together to provide you with the care you need, when you need it.  Your next appointment:   3 month(s)  Provider:   Darryle ONEIDA Decent, MD    We recommend signing up for the patient portal called MyChart.  Sign up information is provided on this After Visit  Summary.  MyChart is used to connect with patients for Virtual Visits (Telemedicine).  Patients are able to view lab/test results, encounter notes, upcoming appointments, etc.  Non-urgent messages can be sent to your provider as well.   To learn more about what you can do with MyChart, go to [email or web address].

## 2024-04-11 NOTE — Progress Notes (Signed)
 " Cardiology Office Note:  .   Date:  04/11/2024  ID:  Harry [name], DOB [birth year], MRN [id number] PCP: [name], MD  [facility] Providers Cardiologist:  Darryle ONEIDA Decent, MD   History of Present Illness: .    Chief Complaint  Patient presents with   Follow-up         Harry [name] is a 83 y.o. male with below history who presents for follow-up.   History of Present Illness   Harry [name] is an 83 year old male with PACs and nonobstructive CAD who presents with irregular heartbeat.  He has been experiencing an irregular heartbeat for the past three weeks, described as a 'thumping' sensation in his chest. These episodes occur intermittently, often when he stands up after sitting or reading, and sometimes when climbing stairs. During these episodes, his pulse, usually around 72 beats per minute, can spike to 120 beats per minute.   No chest pain, pressure, or tightness is associated with these episodes. No significant shortness of breath, but he mentions a possible cough. He feels weak during these episodes but has not experienced any symptoms during physical activities such as walking or hiking.  His medical history includes premature atrial contractions (PACs) and nonobstructive coronary artery disease (CAD). He has previously undergone a workup for an aortic aneurysm due to his father's history. He maintains a healthy lifestyle, including regular exercise, and does not smoke. He occasionally consumes alcohol  and takes allergy  shots.  His current medications include tamsulosin , Advil cold and sinus, Aleve, and Allegra. He does not take any blood thinners or medications specifically for his heart condition.           Problem List Coronary calcium  -CAC 72 (21st percentile) -minimal CAD (<25%)  2. HLD -T chol 206, HDL 62, LDL 133, TG 56 3. PACs -5.5% burden    ROS: All other ROS reviewed and negative. Pertinent positives noted in the HPI.     Studies  Reviewed: SABRA   EKG Interpretation Date/Time:  Tuesday April 11 2024 08:29:21 EST Ventricular Rate:  71 PR Interval:  234 QRS Duration:  84 QT Interval:  354 QTC Calculation: 384 R Axis:   27  Text Interpretation: Sinus rhythm with Atrial premature complexes Low voltage QRS Nonspecific ST abnormality Confirmed by Decent Darryle [phone]) on 04/11/2024 8:33:20 AM   Physical Exam:   VS:  BP 130/60   Pulse 69   Ht 6' 3 (1.905 m)   Wt 204 lb (92.5 kg)   SpO2 98%   BMI 25.50 kg/m    Wt Readings from Last 3 Encounters:  04/11/24 204 lb (92.5 kg)  03/31/24 198 lb (89.8 kg)  02/29/24 202 lb 14.4 oz (92 kg)    GEN: Well nourished, well developed in no acute distress NECK: No JVD; No carotid bruits CARDIAC: RRR, no murmurs, rubs, gallops RESPIRATORY:  Clear to auscultation without rales, wheezing or rhonchi  ABDOMEN: Soft, non-tender, non-distended EXTREMITIES:  No edema; No deformity  ASSESSMENT AND PLAN: .   Assessment and Plan    Premature atrial contractions Intermittent irregular heartbeat with PACs. EKG shows sinus rhythm. Differential includes AFib, but not confirmed.  - Ordered 2-week heart monitor to assess for AFib or other arrhythmias. - Scheduled echocardiogram to evaluate cardiac structure and function. - Prescribed metoprolol  succinate 12.5 mg daily to manage symptoms. - Ordered lab work including BMET, TSH, and CBC.  Follow-up: Return in about 3 months (around 07/10/2024).  Signed, Darryle DASEN. Barbaraann, MD, [facility]  [facility]  [address] [place], [place] [address] [phone]  9:00 AM   "

## 2024-04-12 ENCOUNTER — Ambulatory Visit: Payer: Self-pay | Admitting: Cardiovascular Disease

## 2024-04-12 DIAGNOSIS — R002 Palpitations: Secondary | ICD-10-CM

## 2024-04-12 DIAGNOSIS — I499 Cardiac arrhythmia, unspecified: Secondary | ICD-10-CM

## 2024-04-12 DIAGNOSIS — R9431 Abnormal electrocardiogram [ECG] [EKG]: Secondary | ICD-10-CM

## 2024-04-12 DIAGNOSIS — E782 Mixed hyperlipidemia: Secondary | ICD-10-CM

## 2024-04-12 DIAGNOSIS — I491 Atrial premature depolarization: Secondary | ICD-10-CM

## 2024-04-12 LAB — ECHOCARDIOGRAM COMPLETE
AR max vel: 4.09 cm2
AV Area VTI: 4.16 cm2
AV Area mean vel: 3.93 cm2
AV Mean grad: 5.5 mmHg
AV Peak grad: 10.2 mmHg
Ao pk vel: 1.6 m/s
Area-P 1/2: 2.72 cm2
Height: 75 in
MV M vel: 6.14 m/s
MV Peak grad: 150.8 mmHg
P 1/2 time: 814 ms
Radius: 0.7 cm
S' Lateral: 2.8 cm
Weight: 3264 [oz_av]

## 2024-04-12 LAB — BASIC METABOLIC PANEL WITH GFR
BUN/Creatinine Ratio: 23 (ref 10–24)
BUN: 19 mg/dL (ref 8–27)
CO2: 25 mmol/L (ref 20–29)
Calcium: 9.5 mg/dL (ref 8.6–10.2)
Chloride: 104 mmol/L (ref 96–106)
Creatinine, Ser: 0.83 mg/dL (ref 0.76–1.27)
Glucose: 81 mg/dL (ref 70–99)
Potassium: 4.6 mmol/L (ref 3.5–5.2)
Sodium: 142 mmol/L (ref 134–144)
eGFR: 86 mL/min/1.73

## 2024-04-12 LAB — CBC
Hematocrit: 46.8 % (ref 37.5–51.0)
Hemoglobin: 15.5 g/dL (ref 13.0–17.7)
MCH: 32.4 pg (ref 26.6–33.0)
MCHC: 33.1 g/dL (ref 31.5–35.7)
MCV: 98 fL — ABNORMAL HIGH (ref 79–97)
Platelets: 204 x10E3/uL (ref 150–450)
RBC: 4.78 x10E6/uL (ref 4.14–5.80)
RDW: 11.8 % (ref 11.6–15.4)
WBC: 4.9 x10E3/uL (ref 3.4–10.8)

## 2024-04-12 LAB — TSH: TSH: 2.52 u[IU]/mL (ref 0.450–4.500)

## 2024-04-17 ENCOUNTER — Ambulatory Visit

## 2024-04-17 DIAGNOSIS — J302 Other seasonal allergic rhinitis: Secondary | ICD-10-CM

## 2024-04-17 DIAGNOSIS — J309 Allergic rhinitis, unspecified: Secondary | ICD-10-CM

## 2024-04-28 ENCOUNTER — Telehealth (INDEPENDENT_AMBULATORY_CARE_PROVIDER_SITE_OTHER): Payer: Self-pay | Admitting: Otolaryngology

## 2024-04-28 ENCOUNTER — Other Ambulatory Visit (INDEPENDENT_AMBULATORY_CARE_PROVIDER_SITE_OTHER): Payer: Self-pay | Admitting: Otolaryngology

## 2024-04-28 DIAGNOSIS — J322 Chronic ethmoidal sinusitis: Secondary | ICD-10-CM

## 2024-04-28 NOTE — Telephone Encounter (Signed)
 The patient called in reporting a new issue, he is experiencing some left sinus pain and eye pain. We have fit him in Dr Rojean schedule for 1:00 pm on 05/10/24.  He wanted me to request that Dr Karis send in an order for a sinus xray/imaging to possibly be done prior to his upcoming appointment.  He also mentioned he has had a prior sinus surgery in his history.

## 2024-05-05 ENCOUNTER — Ambulatory Visit (HOSPITAL_COMMUNITY)
Admission: RE | Admit: 2024-05-05 | Discharge: 2024-05-05 | Disposition: A | Discharge status: Home / Self Care | Source: Ambulatory Visit | Attending: Otolaryngology | Admitting: Otolaryngology

## 2024-05-05 ENCOUNTER — Other Ambulatory Visit (HOSPITAL_COMMUNITY): Payer: Self-pay

## 2024-05-05 DIAGNOSIS — J322 Chronic ethmoidal sinusitis: Secondary | ICD-10-CM | POA: Insufficient documentation

## 2024-05-10 ENCOUNTER — Ambulatory Visit (INDEPENDENT_AMBULATORY_CARE_PROVIDER_SITE_OTHER): Admitting: Otolaryngology

## 2024-05-10 ENCOUNTER — Other Ambulatory Visit (HOSPITAL_COMMUNITY): Payer: Self-pay

## 2024-05-10 ENCOUNTER — Encounter (INDEPENDENT_AMBULATORY_CARE_PROVIDER_SITE_OTHER): Payer: Self-pay | Admitting: Otolaryngology

## 2024-05-10 VITALS — BP 115/69 | HR 79

## 2024-05-10 DIAGNOSIS — H608X3 Other otitis externa, bilateral: Secondary | ICD-10-CM | POA: Diagnosis not present

## 2024-05-10 DIAGNOSIS — J342 Deviated nasal septum: Secondary | ICD-10-CM | POA: Diagnosis not present

## 2024-05-10 DIAGNOSIS — H6983 Other specified disorders of Eustachian tube, bilateral: Secondary | ICD-10-CM | POA: Insufficient documentation

## 2024-05-10 DIAGNOSIS — J310 Chronic rhinitis: Secondary | ICD-10-CM | POA: Diagnosis not present

## 2024-05-10 DIAGNOSIS — H61811 Exostosis of right external canal: Secondary | ICD-10-CM

## 2024-05-10 DIAGNOSIS — J343 Hypertrophy of nasal turbinates: Secondary | ICD-10-CM

## 2024-05-10 MED ORDER — MOMETASONE FUROATE 0.1 % EX CREA
1.0000 | TOPICAL_CREAM | Freq: Every day | CUTANEOUS | 3 refills | Status: AC | PRN
  Filled 2024-05-10: qty 15, 30d supply, fill #0

## 2024-05-10 MED ORDER — FLONASE SENSIMIST 27.5 MCG/SPRAY NA SUSP
2.0000 | Freq: Every day | NASAL | 12 refills | Status: AC
  Filled 2024-05-10: qty 10, fill #0

## 2024-05-10 NOTE — Progress Notes (Unsigned)
 Patient ID: Harry [name], male   DOB: 01/15/1941, 84 y.o.   MRN: [id number]  Follow up: Right ear canal exostosis, chronic itchiness, chronic nasal congestion, recurrent sinusitis  History of Present Illness Harry [name] is an 84 year old male who presents today for evaluation of nasal and ear symptoms.  He has a longstanding deviated nasal septum, confirmed on recent imaging, and describes chronic nasal obstruction, predominantly on the left side, with a sensation of nasal fullness. He suspects age-related collapse of nasal structure. He previously declined septoplasty but underwent sinus surgery, which resolved prior sinus symptoms. He intermittently uses intranasal corticosteroids and decongestants, but avoids regular use due to concerns about epistaxis. He denies current sinus infections, nasal polyps, or nasopharyngeal obstruction.  He experiences intermittent right ear symptoms, including dullness, pressure, and difficulty equalizing ear pressure, particularly during air travel. He uses decongestants as needed for upper respiratory symptoms. He notes pruritus and xerosis in the right ear, which he attributes to eczema-like changes. He recalls prior ear cleaning and questions whether residual debris may contribute to symptoms. He denies sensorineural hearing loss and remains vigilant for changes due to prior ear cancer, but has not observed new concerning findings.  Exam: General: Communicates without difficulty, well nourished, no acute distress. Head: Normocephalic, no evidence injury, no tenderness, facial buttresses intact without stepoff. Face/sinus: No tenderness to palpation and percussion. Facial movement is normal and symmetric. Eyes: PERRL, EOMI. No scleral icterus, conjunctivae clear. Neuro: CN II exam reveals vision grossly intact.  No nystagmus at any point of gaze. Ears: Auricles well formed without lesions.  Right ear canal bony exostosis noted.  Eczematous changes noted within  the ear canals.  Nose: External evaluation reveals normal support and skin without lesions.  Dorsum is intact.  Anterior rhinoscopy reveals congested mucosa over anterior aspect of inferior turbinates and deviated septum.  No purulence noted. Oral:  Oral cavity and oropharynx are intact, symmetric, without erythema or edema.  Mucosa is moist without lesions. Neck: Full range of motion without pain.  There is no significant lymphadenopathy.  No masses palpable.  Thyroid bed within normal limits to palpation.  Parotid glands and submandibular glands equal bilaterally without mass.  Trachea is midline. Neuro:  CN 2-12 grossly intact.  Procedure:  Flexible Nasal Endoscopy: Description: Risks, benefits, and alternatives of flexible endoscopy were explained to the patient.  Specific mention was made of the risk of throat numbness with difficulty swallowing, possible bleeding from the nose and mouth, and pain from the procedure.  The patient gave oral consent to proceed.  The flexible scope was inserted into the right nasal cavity.  Endoscopy of the interior nasal cavity, superior, inferior, and middle meatus was performed. The sphenoid-ethmoid recess was examined. Edematous mucosa was noted.  No polyp, mass, or lesion was appreciated. Nasal septal deviation noted. Olfactory cleft was clear.  Nasopharynx was clear.  Turbinates were hypertrophied but without mass.  The procedure was repeated on the contralateral side with similar findings.  The patient tolerated the procedure well.   Assessment & Plan Chronic rhinitis with deviated nasal septum and bilateral inferior turbinate hypertrophy. Chronic septal deviation, likely secondary to prior nasal trauma, confirmed on physical examination and imaging. No evidence of significant sinus infection, nasal airway compromise, polyps, or nasopharyngeal mass. Mild nasal congestion and concern for age-related nasal valve collapse, but he does not desire surgical correction at  this time. - Performed physical examination and flexible endoscopy to assess nasal septum and nasopharynx. - Discussed  surgical correction; he declined intervention. - Discussed intranasal corticosteroid (Flonase  Sensimist) for management of nasal congestion. - Sent prescription for Flonase  Sensimist to pharmacy.  Eczema of external ear canal Chronic, mildly pruritic external auditory canal consistent with eczema. Exostosis limits visualization on the right, but left tympanic membrane is healthy. - Performed otoscopic examination of external auditory canal and tympanic membrane. - Discussed topical corticosteroid therapy for ear canal eczema. - Sent prescription for Elocon  (mometasone ) ointment to pharmacy with instructions to apply using a cotton-tipped applicator.

## 2024-05-12 ENCOUNTER — Other Ambulatory Visit (HOSPITAL_COMMUNITY): Payer: Self-pay

## 2024-05-14 DIAGNOSIS — I499 Cardiac arrhythmia, unspecified: Secondary | ICD-10-CM

## 2024-05-14 DIAGNOSIS — R002 Palpitations: Secondary | ICD-10-CM

## 2024-05-14 DIAGNOSIS — R9431 Abnormal electrocardiogram [ECG] [EKG]: Secondary | ICD-10-CM

## 2024-05-14 DIAGNOSIS — I491 Atrial premature depolarization: Secondary | ICD-10-CM

## 2024-05-14 DIAGNOSIS — E782 Mixed hyperlipidemia: Secondary | ICD-10-CM

## 2024-05-16 ENCOUNTER — Other Ambulatory Visit (HOSPITAL_COMMUNITY): Payer: Self-pay

## 2024-05-16 MED ORDER — METOPROLOL SUCCINATE ER 25 MG PO TB24
25.0000 mg | ORAL_TABLET | Freq: Every day | ORAL | 3 refills | Status: AC
  Filled 2024-05-16: qty 90, 90d supply, fill #0

## 2024-05-16 MED ORDER — DOXYCYCLINE HYCLATE 100 MG PO CAPS
100.0000 mg | ORAL_CAPSULE | Freq: Every day | ORAL | 0 refills | Status: AC
  Filled 2024-05-16: qty 14, 14d supply, fill #0

## 2024-05-19 ENCOUNTER — Other Ambulatory Visit (HOSPITAL_COMMUNITY): Payer: Self-pay

## 2024-05-19 MED ORDER — PREDNISONE 10 MG PO TABS
10.0000 mg | ORAL_TABLET | Freq: Three times a day (TID) | ORAL | 0 refills | Status: AC
  Filled 2024-05-19: qty 90, 30d supply, fill #0

## 2024-05-23 ENCOUNTER — Ambulatory Visit

## 2024-05-23 DIAGNOSIS — J302 Other seasonal allergic rhinitis: Secondary | ICD-10-CM | POA: Diagnosis not present

## 2024-07-13 ENCOUNTER — Ambulatory Visit: Admitting: Cardiovascular Disease

## 2025-03-06 ENCOUNTER — Ambulatory Visit: Admitting: Allergy and Immunology
